# Patient Record
Sex: Female | Born: 1955
Health system: Southern US, Community
[De-identification: ages and names within clinical notes are randomized; demographics above are authoritative.]

## PROBLEM LIST (undated history)

## (undated) DIAGNOSIS — I10 Essential (primary) hypertension: Secondary | ICD-10-CM

## (undated) DIAGNOSIS — I498 Other specified cardiac arrhythmias: Secondary | ICD-10-CM

## (undated) DIAGNOSIS — G90A Postural orthostatic tachycardia syndrome (POTS): Secondary | ICD-10-CM

## (undated) DIAGNOSIS — D8940 Mast cell activation, unspecified: Secondary | ICD-10-CM

## (undated) DIAGNOSIS — M8580 Other specified disorders of bone density and structure, unspecified site: Secondary | ICD-10-CM

## (undated) DIAGNOSIS — K5792 Diverticulitis of intestine, part unspecified, without perforation or abscess without bleeding: Secondary | ICD-10-CM

## (undated) DIAGNOSIS — M797 Fibromyalgia: Secondary | ICD-10-CM

## (undated) DIAGNOSIS — G901 Familial dysautonomia [Riley-Day]: Secondary | ICD-10-CM

## (undated) DIAGNOSIS — R000 Tachycardia, unspecified: Secondary | ICD-10-CM

## (undated) DIAGNOSIS — F419 Anxiety disorder, unspecified: Secondary | ICD-10-CM

## (undated) DIAGNOSIS — A6920 Lyme disease, unspecified: Secondary | ICD-10-CM

## (undated) DIAGNOSIS — I951 Orthostatic hypotension: Secondary | ICD-10-CM

## (undated) HISTORY — DX: Other specified disorders of bone density and structure, unspecified site: M85.80

---

## 1998-02-28 ENCOUNTER — Other Ambulatory Visit: Admission: RE | Admit: 1998-02-28 | Discharge: 1998-02-28 | Payer: Self-pay | Admitting: Obstetrics & Gynecology

## 1998-07-09 ENCOUNTER — Ambulatory Visit (HOSPITAL_COMMUNITY): Admission: RE | Admit: 1998-07-09 | Discharge: 1998-07-09 | Payer: Self-pay | Admitting: Neurology

## 1999-04-12 ENCOUNTER — Other Ambulatory Visit: Admission: RE | Admit: 1999-04-12 | Discharge: 1999-04-12 | Payer: Self-pay | Admitting: Obstetrics & Gynecology

## 2000-05-29 ENCOUNTER — Other Ambulatory Visit: Admission: RE | Admit: 2000-05-29 | Discharge: 2000-05-29 | Payer: Self-pay | Admitting: Obstetrics & Gynecology

## 2001-07-02 ENCOUNTER — Other Ambulatory Visit: Admission: RE | Admit: 2001-07-02 | Discharge: 2001-07-02 | Payer: Self-pay | Admitting: Obstetrics & Gynecology

## 2002-10-13 ENCOUNTER — Other Ambulatory Visit: Admission: RE | Admit: 2002-10-13 | Discharge: 2002-10-13 | Payer: Self-pay | Admitting: Obstetrics & Gynecology

## 2003-12-27 ENCOUNTER — Other Ambulatory Visit: Admission: RE | Admit: 2003-12-27 | Discharge: 2003-12-27 | Payer: Self-pay | Admitting: Obstetrics & Gynecology

## 2004-01-17 ENCOUNTER — Encounter
Admission: RE | Admit: 2004-01-17 | Discharge: 2004-01-17 | Payer: Self-pay | Admitting: Physical Medicine and Rehabilitation

## 2004-02-19 HISTORY — PX: COLPOSCOPY: SHX161

## 2004-04-19 HISTORY — PX: CERVICAL BIOPSY  W/ LOOP ELECTRODE EXCISION: SUR135

## 2004-11-14 ENCOUNTER — Emergency Department (HOSPITAL_COMMUNITY): Admission: EM | Admit: 2004-11-14 | Discharge: 2004-11-14 | Payer: Self-pay | Admitting: Emergency Medicine

## 2005-06-16 ENCOUNTER — Ambulatory Visit: Payer: Self-pay | Admitting: Internal Medicine

## 2005-09-09 ENCOUNTER — Encounter: Admission: RE | Admit: 2005-09-09 | Discharge: 2005-09-09 | Payer: Self-pay | Admitting: Obstetrics & Gynecology

## 2005-09-15 ENCOUNTER — Encounter: Admission: RE | Admit: 2005-09-15 | Discharge: 2005-09-15 | Payer: Self-pay | Admitting: Obstetrics & Gynecology

## 2005-10-20 ENCOUNTER — Ambulatory Visit: Payer: Self-pay | Admitting: Internal Medicine

## 2005-11-11 ENCOUNTER — Ambulatory Visit (HOSPITAL_BASED_OUTPATIENT_CLINIC_OR_DEPARTMENT_OTHER): Admission: RE | Admit: 2005-11-11 | Discharge: 2005-11-11 | Payer: Self-pay | Admitting: Internal Medicine

## 2005-11-16 ENCOUNTER — Ambulatory Visit: Payer: Self-pay | Admitting: Internal Medicine

## 2005-12-17 ENCOUNTER — Ambulatory Visit: Payer: Self-pay | Admitting: Internal Medicine

## 2006-01-07 ENCOUNTER — Encounter: Admission: RE | Admit: 2006-01-07 | Discharge: 2006-01-07 | Payer: Self-pay | Admitting: *Deleted

## 2008-07-18 ENCOUNTER — Encounter: Admission: RE | Admit: 2008-07-18 | Discharge: 2008-07-18 | Payer: Self-pay | Admitting: Family Medicine

## 2009-01-16 ENCOUNTER — Encounter: Admission: RE | Admit: 2009-01-16 | Discharge: 2009-01-16 | Payer: Self-pay | Admitting: Family Medicine

## 2009-03-20 ENCOUNTER — Emergency Department (HOSPITAL_COMMUNITY): Admission: EM | Admit: 2009-03-20 | Discharge: 2009-03-20 | Payer: Self-pay | Admitting: Emergency Medicine

## 2010-05-26 ENCOUNTER — Encounter: Payer: Self-pay | Admitting: Chiropractic Medicine

## 2010-09-20 NOTE — Assessment & Plan Note (Signed)
[facility]                               PULMONARY OFFICE NOTE   KACI, DILLIE                       MRN:          [id number]  DATE:12/17/2005                            DOB:          [birth year]    PULMONARY SLEEP FOLLOWUP:   PROBLEM LIST:  1. Excessive daytime somnolence.  2. Allergic rhinitis.   HISTORY:  She returns now after her nocturnal polysomnogram done November 11, 2005.  She had some difficulty initiating and maintaining sleep, primarily  in the first part of the night.  Otherwise she had only occasional sleep  disorder breathing events with an apnea-hypopnea index of 2.9 per hour which  is within normal limits.  Snoring was moderate to loud at times but oxygen  saturation fell no lower than 90%.  We compared this study to her first one  done elsewhere in 2005, which also showed essentially no sleep disordered  breathing.  Her primary complaint has been of daytime sleepiness.  She has  always been a night person and we discussed this.  She is having trouble in  particular when she has to sit quietly at 9 in the morning classes.  She  also notices increasing nasal congestion and we discussed management of  seasonal rhinitis complaints which may contribute to her occasional snoring.   MEDICATIONS:  Effexor 75 mg.  Note, that she stopped the Toprol and does  find as predicted that she has better energy without it.  She is to discuss  the indications and alternatives to Toprol with Dr. Doristine Counter.   OBJECTIVE:  VITAL SIGNS:  Weight 150 pounds, BP 142/100, pulse regular 86,  room air saturation 100%.  GENERAL:  She is trim and alert.  There is periorbital puffiness, mild nasal  stuffiness, but no frank nasal obstruction.  Breathing and pulse are normal.   IMPRESSION:  1. Second sleep study again confirms that she does not have obstructive      sleep apnea on a routine basis.  2. Snoring, probably relates to her allergic rhinitis.  3.  A component of her daytime fatigue complaint may be due to upper airway      resistance syndrome with sleep somewhat fragmented even though she does      not stop breathing enough to meet a formal diagnosis of sleep apnea.      This would be managed conservatively.   PLAN:  1. Discussed available options for management of allergic rhinitis      including trial of Singulair 1 daily or a nasal steroid if needed.  2. Try sample Provigil, 1/2 or 1 x 200 mg daily p.r.n. with careful      medical discussion done.  3. Emphasis on good sleep hygiene and realistic expectations including      opportunity for brief naps and appropriate use of caffeine.  4. She will discuss the risk benefit issues of a beta-blocker with her      primary physician,      question whether this has been an important part of her daytime  weariness.  5. I have offered to see her again p.r.n.                                   Clinton D. Maple Hudson, MD, [facility], FACP   CDY/MedQ  DD:  12/17/2005  DT:  12/17/2005  Job #:  [id number]   cc:   Marjory Lies, MD

## 2010-09-20 NOTE — Procedures (Signed)
NAME:  Katie [name], Katie [name]                ACCOUNT NO.:  [id number]   MEDICAL RECORD NO.:  [id number]          PATIENT TYPE:  OUT   LOCATION:  SLEEP CENTER                 FACILITY:  [facility]   PHYSICIAN:  Clinton D. Maple Hudson, M.D. DATE OF BIRTH:  09-Jan-1956   DATE OF STUDY:  11/11/2005                              NOCTURNAL POLYSOMNOGRAM   REFERRING PHYSICIAN:  Dr. Jetty Duhamel.   INDICATIONS FOR STUDY:  Hypersomnia with sleep apnea.   EPWORTH SLEEPINESS SCORE:  9/24, BMI 23.6.  Weight 151 pounds.   HOME MEDICATION:  Effexor, Boniva.   SLEEP ARCHITECTURE:  Total sleep time 331 minutes with sleep efficiency 76%.  Stage I was 6%, stage II 62%, stages III and IV were absent, REM 32% of  total sleep time.  Sleep latency 77 minutes, REM latency 89 minutes, awake  after sleep onset 29 minutes, arousal index of 16.  No bedtime medication  was taken.  Sleep onset was delayed at 12:02 a.m., and sleep was fragmented  with frequent wakings until approximately 2:00 a.m.Marland Kitchen  She was described as  restless in the first part of the night with no other specific observation  offered by technician or patient.   RESPIRATORY DATA:  Apnea/hypopnea index (AHI, RDI) 2.9 obstructive events  per hour which is within normal limits (normal range 0-5 per hour).  There  were 8 obstructive apneas and 8 hypopneas.  Events were not positional.  REM  AHI 7.4.   OXYGEN DATA:  Moderate to severe snoring with oxygen desaturation to a nadir  of 90%.  Mean oxygen saturation through the study was 97% on room air.   CARDIAC DATA:  Normal sinus rhythm.   MOVEMENT/PARASOMNIA:  A total of 28 limb jerks were recorded of which 10  were associated with arousal or awakening for a periodic limb movement with  arousal index of 1.8 per hour which is increased.   IMPRESSION/RECOMMENDATIONS:  1.  Delayed sleep onset and fragmented initial hours of sleep.  This should      be correlated with the patient's home sleep experience.  2.   Occasional sleep disordered breathing events, apnea/hypopnea index 2.9      per hour, within normal limits.  Snoring was moderate to loud with      oxygen desaturation to a nadir of 90%.  Consider possibility that upper      airway resistance is contributing to sleep fragmentation without meeting      diagnostic criteria be considered apneas.      Clinton D. Maple Hudson, M.D.  Diplomate, Biomedical engineer of Sleep Medicine  Electronically Signed     CDY/MEDQ  D:  11/16/2005 09:07:39  T:  11/16/2005 10:46:23  Job:  [id number]

## 2010-10-05 ENCOUNTER — Emergency Department (HOSPITAL_COMMUNITY)
Admission: EM | Admit: 2010-10-05 | Discharge: 2010-10-05 | Disposition: A | Discharge status: Home / Self Care | Payer: Medicare Other | Attending: Emergency Medicine | Admitting: Emergency Medicine

## 2010-10-05 DIAGNOSIS — R5381 Other malaise: Secondary | ICD-10-CM | POA: Insufficient documentation

## 2010-10-05 DIAGNOSIS — F410 Panic disorder [episodic paroxysmal anxiety] without agoraphobia: Secondary | ICD-10-CM | POA: Insufficient documentation

## 2010-10-05 DIAGNOSIS — R5382 Chronic fatigue, unspecified: Secondary | ICD-10-CM | POA: Insufficient documentation

## 2010-10-05 DIAGNOSIS — R1032 Left lower quadrant pain: Secondary | ICD-10-CM | POA: Insufficient documentation

## 2010-10-05 DIAGNOSIS — R42 Dizziness and giddiness: Secondary | ICD-10-CM | POA: Insufficient documentation

## 2010-10-05 DIAGNOSIS — G9332 Myalgic encephalomyelitis/chronic fatigue syndrome: Secondary | ICD-10-CM | POA: Insufficient documentation

## 2010-10-05 DIAGNOSIS — R5383 Other fatigue: Secondary | ICD-10-CM | POA: Insufficient documentation

## 2010-10-05 DIAGNOSIS — I10 Essential (primary) hypertension: Secondary | ICD-10-CM | POA: Insufficient documentation

## 2010-10-05 LAB — POCT PREGNANCY, URINE

## 2010-10-05 LAB — GLUCOSE, CAPILLARY: Glucose-Capillary: 109 mg/dL — ABNORMAL HIGH (ref 70–99)

## 2011-07-01 ENCOUNTER — Emergency Department (HOSPITAL_COMMUNITY)
Admission: EM | Admit: 2011-07-01 | Discharge: 2011-07-01 | Disposition: A | Discharge status: Home / Self Care | Payer: Medicare Other | Attending: Emergency Medicine | Admitting: Emergency Medicine

## 2011-07-01 ENCOUNTER — Encounter (HOSPITAL_COMMUNITY): Payer: Self-pay | Admitting: *Deleted

## 2011-07-01 DIAGNOSIS — E876 Hypokalemia: Secondary | ICD-10-CM | POA: Insufficient documentation

## 2011-07-01 DIAGNOSIS — R197 Diarrhea, unspecified: Secondary | ICD-10-CM | POA: Insufficient documentation

## 2011-07-01 DIAGNOSIS — E860 Dehydration: Secondary | ICD-10-CM

## 2011-07-01 DIAGNOSIS — E871 Hypo-osmolality and hyponatremia: Secondary | ICD-10-CM | POA: Insufficient documentation

## 2011-07-01 HISTORY — DX: Essential (primary) hypertension: I10

## 2011-07-01 HISTORY — DX: Lyme disease, unspecified: A69.20

## 2011-07-01 LAB — BASIC METABOLIC PANEL
CO2: 25 mEq/L (ref 19–32)
Calcium: 9.7 mg/dL (ref 8.4–10.5)
GFR calc Af Amer: >90 mL/min (ref >90)
GFR calc non Af Amer: >90 mL/min (ref >90)
Sodium: 130 mEq/L — ABNORMAL LOW (ref 135–145)

## 2011-07-01 LAB — URINALYSIS, ROUTINE W REFLEX MICROSCOPIC
Glucose, UA: NEGATIVE mg/dL
Leukocytes, UA: NEGATIVE
Nitrite: NEGATIVE
Protein, ur: NEGATIVE mg/dL
Urobilinogen, UA: 0.2 mg/dL (ref 0.0–1.0)

## 2011-07-01 LAB — URINE MICROSCOPIC-ADD ON

## 2011-07-01 MED ORDER — POTASSIUM CHLORIDE CRYS ER 20 MEQ PO TBCR: 20.0000 meq | EXTENDED_RELEASE_TABLET | Freq: Two times a day (BID) | ORAL | Status: DC

## 2011-07-01 MED ORDER — POTASSIUM CHLORIDE CRYS ER 20 MEQ PO TBCR
40.0000 meq | EXTENDED_RELEASE_TABLET | Freq: Once | ORAL | Status: AC
  Administered 2011-07-01: 40 meq via ORAL
  Filled 2011-07-01: qty 2

## 2011-07-01 MED ORDER — SODIUM CHLORIDE 0.9 % IV BOLUS (SEPSIS)
1000.0000 mL | Freq: Once | INTRAVENOUS | Status: AC
  Administered 2011-07-01: 1000 mL via INTRAVENOUS

## 2011-07-01 NOTE — ED Provider Notes (Signed)
History     CSN: [id number]  Arrival date & time 07/01/11  0019   First MD Initiated Contact with Patient 07/01/11 0142      Chief Complaint  Patient presents with  . Dizziness  . Hypertension    (Consider location/radiation/quality/duration/timing/severity/associated sxs/prior treatment) HPI Comments: 56 year old female with a history of chronic fatigue syndrome, fibromyalgia, Lyme disease, hypertension. She presents with a complaint of generalized weakness over the last 24 hours. According to the patient she started having watery diarrhea yesterday and has had persistent watery diarrhea throughout the day. She states that she has felt lightheaded with standing, dizzy, dehydrated and has had a poor appetite. She denies chest pain shortness of breath back pain swelling rashes fevers headache or any other complaints.  Patient is a 56 y.o. female presenting with hypertension. The history is provided by the patient and the spouse.  Hypertension    Past Medical History  Diagnosis Date  . Hypertension   . Lyme disease     History reviewed. No pertinent past surgical history.  History reviewed. No pertinent family history.  History  Substance Use Topics  . Smoking status: Not on file  . Smokeless tobacco: Not on file  . Alcohol Use:     OB History    Grav Para Term Preterm Abortions TAB SAB Ect Mult Living                  Review of Systems  All other systems reviewed and are negative.    Allergies  Penicillins and Sulfa antibiotics  Home Medications   Current Outpatient Rx  Name Route Sig Dispense Refill  . POTASSIUM CHLORIDE CRYS ER 20 MEQ PO TBCR Oral Take 1 tablet (20 mEq total) by mouth 2 (two) times daily. 20 tablet 0    BP 122/76  Pulse 98  Temp(Src) 98.4 F (36.9 C) (Oral)  Resp 18  SpO2 99%  Physical Exam  Nursing note and vitals reviewed. Constitutional: She appears well-developed and well-nourished. No distress.  HENT:  Head: Normocephalic  and atraumatic.  Mouth/Throat: Oropharynx is clear and moist. No oropharyngeal exudate.  Eyes: Conjunctivae and EOM are normal. Pupils are equal, round, and reactive to light. Right eye exhibits no discharge. Left eye exhibits no discharge. No scleral icterus.  Neck: Normal range of motion. Neck supple. No JVD present. No thyromegaly present.  Cardiovascular: Normal rate, regular rhythm, normal heart sounds and intact distal pulses.  Exam reveals no gallop and no friction rub.   No murmur heard. Pulmonary/Chest: Effort normal and breath sounds normal. No respiratory distress. She has no wheezes. She has no rales.  Abdominal: Soft. Bowel sounds are normal. She exhibits no distension and no mass. There is no tenderness.  Musculoskeletal: Normal range of motion. She exhibits no edema and no tenderness.  Lymphadenopathy:    She has no cervical adenopathy.  Neurological: She is alert. Coordination normal.  Skin: Skin is warm and dry. No rash noted. No erythema.  Psychiatric: She has a normal mood and affect. Her behavior is normal.    ED Course  Procedures (including critical care time)  Labs Reviewed  BASIC METABOLIC PANEL - Abnormal; Notable for the following:    Sodium 130 (*)    Potassium 3.0 (*)    Chloride 92 (*)    All other components within normal limits  URINALYSIS, ROUTINE W REFLEX MICROSCOPIC - Abnormal; Notable for the following:    Hgb urine dipstick MODERATE (*)    Ketones, ur TRACE (*)  All other components within normal limits  URINE MICROSCOPIC-ADD ON   No results found.   1. Hypokalemia   2. Diarrhea   3. Dehydration   4. Hyponatremia       MDM  No abdominal tenderness, vital signs remarkable only for blood pressure of 170/95 but this has since improved spontaneously. We'll proceed with IV fluids, check a lateralizing urinalysis, suspect related to dehydration and possible diarrhea type illness. Intravenous fluid   Lab results interpretation shows  hyponatremia and hypokalemia. This is mild and can be followed as outpatient.  Patient improved significantly after IV fluids, potassium supplementation given prior to discharge  Vida Roller, MD 07/01/11 [phone]

## 2011-07-01 NOTE — ED Notes (Signed)
Pt feeling better.  Given a sandwich and drink.

## 2011-07-01 NOTE — ED Notes (Signed)
Pt states she hasn't felt well all day.  C/o "pressure" in RLQ.  Diarrhea x1day.  Took pressure at home and it was 190 over something.  States she feels light headed.

## 2011-07-01 NOTE — Discharge Instructions (Signed)
Return to the emergency department for severe or worsening symptoms. Clear Dr. in the morning to arrange a followup exam in the next one to 2 days.  RESOURCE GUIDE  Dental Problems  Patients with Medicaid: [facility] [phone] [address]                                           [phone] [address] Phone:  [phone]                                                  Phone:  [phone]  If unable to pay or uninsured, contact:  [facility] or [facility]. to become qualified for the adult dental clinic.  Chronic Pain Problems Contact [facility]  [phone] Patients need to be referred by their primary care doctor.  Insufficient Money for Medicine Contact [employer] Way:  call "211" or [facility] [phone].  No Primary Care Doctor Call [facility]  [phone] Other agencies that provide inexpensive medical care    [facility]  [phone]    [facility]  [phone]    [facility]  [phone]    [facility]  [phone]    [facility]  [phone]    [facility] Clinic  [phone]  Psychological Services [facility] Behavioral Health  [phone] [facility] Services  [phone] [facility] Mental Health   [phone] (emergency services ([phone]) [phone])  Substance Abuse Resources [facility]  [phone] [facility] [phone] [facility] [phone] Floydene Flock [phone] Residential & Outpatient Substance Abuse Program  [phone]  Abuse/Neglect [facility] Child Abuse Hotline 7170716553 [facility] Child Abuse Hotline [phone] (After Hours)  Emergency Shelter [facility] [phone]  Maternity Homes [facility] [place] [facility] [place] [phone] [facility] Services [phone]  MRSA Hotline #:   437-728-2300    [facility] Resources  [facility]  [place]     [employer]                          [facility] Dept. [address] [place]                       [address]      [address]  [place]                                                Cristobal Goldmann Phone:  [phone]                                   Phone:  [phone]                 Phone:  [phone]  [facility] Mental Health Phone:  [phone]  [facility] Child Abuse Hotline 5047892715 218-523-1964 (After Hours)

## 2011-07-01 NOTE — ED Notes (Addendum)
Pt in c/o dizziness at home, states she checked her BP at home and it was elevated, states she was taken off BP medication 1 year ago by MD, also c/o dull pain to RUQ x1 day with diarrhea

## 2011-09-10 DIAGNOSIS — E871 Hypo-osmolality and hyponatremia: Secondary | ICD-10-CM | POA: Insufficient documentation

## 2011-09-10 DIAGNOSIS — R197 Diarrhea, unspecified: Secondary | ICD-10-CM | POA: Insufficient documentation

## 2011-09-10 DIAGNOSIS — IMO0001 Reserved for inherently not codable concepts without codable children: Secondary | ICD-10-CM | POA: Insufficient documentation

## 2011-09-10 DIAGNOSIS — E876 Hypokalemia: Secondary | ICD-10-CM | POA: Insufficient documentation

## 2011-09-10 DIAGNOSIS — F411 Generalized anxiety disorder: Secondary | ICD-10-CM | POA: Insufficient documentation

## 2011-09-10 DIAGNOSIS — I10 Essential (primary) hypertension: Secondary | ICD-10-CM | POA: Insufficient documentation

## 2011-09-11 ENCOUNTER — Encounter (HOSPITAL_COMMUNITY): Payer: Self-pay | Admitting: Family Medicine

## 2011-09-11 ENCOUNTER — Emergency Department (HOSPITAL_COMMUNITY)
Admission: EM | Admit: 2011-09-11 | Discharge: 2011-09-11 | Disposition: A | Discharge status: Home / Self Care | Payer: Medicare Other | Attending: Emergency Medicine | Admitting: Emergency Medicine

## 2011-09-11 DIAGNOSIS — E876 Hypokalemia: Secondary | ICD-10-CM

## 2011-09-11 DIAGNOSIS — R197 Diarrhea, unspecified: Secondary | ICD-10-CM

## 2011-09-11 DIAGNOSIS — F419 Anxiety disorder, unspecified: Secondary | ICD-10-CM

## 2011-09-11 DIAGNOSIS — E871 Hypo-osmolality and hyponatremia: Secondary | ICD-10-CM

## 2011-09-11 HISTORY — DX: Anxiety disorder, unspecified: F41.9

## 2011-09-11 HISTORY — DX: Fibromyalgia: M79.7

## 2011-09-11 LAB — DIFFERENTIAL
Basophils Relative: 1 % (ref 0–1)
Eosinophils Absolute: 0.2 10*3/uL (ref 0.0–0.7)
Eosinophils Relative: 3 % (ref 0–5)
Neutrophils Relative %: 49 % (ref 43–77)

## 2011-09-11 LAB — CBC
MCH: 33.9 pg (ref 26.0–34.0)
MCHC: 34.9 g/dL (ref 30.0–36.0)
MCV: 97.1 fL (ref 78.0–100.0)
Platelets: 282 10*3/uL (ref 150–400)
RDW: 12.2 % (ref 11.5–15.5)

## 2011-09-11 MED ORDER — LORAZEPAM 2 MG/ML IJ SOLN
1.0000 mg | Freq: Once | INTRAMUSCULAR | Status: AC
  Administered 2011-09-11: 1 mg via INTRAVENOUS
  Filled 2011-09-11: qty 1

## 2011-09-11 MED ORDER — POTASSIUM CHLORIDE CRYS ER 20 MEQ PO TBCR
20.0000 meq | EXTENDED_RELEASE_TABLET | Freq: Once | ORAL | Status: AC
  Administered 2011-09-11: 20 meq via ORAL
  Filled 2011-09-11: qty 1

## 2011-09-11 MED ORDER — LORAZEPAM 1 MG PO TABS: 0.5000 mg | ORAL_TABLET | Freq: Three times a day (TID) | ORAL | Status: AC | PRN

## 2011-09-11 MED ORDER — SODIUM CHLORIDE 0.9 % IV BOLUS (SEPSIS)
1000.0000 mL | Freq: Once | INTRAVENOUS | Status: AC
  Administered 2011-09-11: 1000 mL via INTRAVENOUS

## 2011-09-11 NOTE — Discharge Instructions (Signed)
Anxiety and Panic Attacks Anxiety is your body's way of reacting to real danger or something you think is a danger. It may be fear or worry over a situation like losing your job. Sometimes the cause is not known. A panic attack is made up of physical signs like sweating, shaking, or chest pain. Anxiety and panic attacks may start suddenly. They may be strong. They may come at any time of day, even while sleeping. They may come at any time of life. Panic attacks are scary, but they do not harm you physically.  HOME CARE  Avoid any known causes of your anxiety.   Try to relax. Yoga may help. Tell yourself everything will be okay.   Exercise often.   Get expert advice and help (therapy) to stop anxiety or attacks from happening.   Avoid caffeine, alcohol, and drugs.   Only take medicine as told by your doctor.  GET HELP RIGHT AWAY IF:  Your attacks seem different than normal attacks.   Your problems are getting worse or concern you.  MAKE SURE YOU:  Understand these instructions.   Will watch your condition.   Will get help right away if you are not doing well or get worse.  Document Released: 05/24/2010 Document Revised: 04/10/2011 Document Reviewed: 05/24/2010 [facility] Patient Information 2012 [place], [place] You have been given a short term prescription for Ativan.  Please followup with your primary care physician for further intervention.  If this becomes a chronic problem.

## 2011-09-11 NOTE — ED Provider Notes (Signed)
History     CSN: [id number]  Arrival date & time 09/10/11  2354   First MD Initiated Contact with Patient 09/11/11 0100      Chief Complaint  Patient presents with  . Panic Attack  . Diarrhea    (Consider location/radiation/quality/duration/timing/severity/associated sxs/prior treatment) HPI Comments: Patient states she's been under a lot of stress.  This week.  Today, he developed copious amounts of diarrhea, followed by "anxiety attack."  She states this happens to her occasionally.  She has had low potassium in the past due to diarrhea.   Patient is a 56 y.o. female presenting with diarrhea. The history is provided by the patient.  Diarrhea The primary symptoms include diarrhea. Primary symptoms do not include fever, nausea or dysuria. The illness began today. The onset was sudden. The problem has been gradually improving.    Past Medical History  Diagnosis Date  . Hypertension   . Lyme disease   . Fibromyalgia   . Anxiety     History reviewed. No pertinent past surgical history.  No family history on file.  History  Substance Use Topics  . Smoking status: Never Smoker   . Smokeless tobacco: Not on file  . Alcohol Use: No     Ocaasional glass of wine    OB History    Grav Para Term Preterm Abortions TAB SAB Ect Mult Living                  Review of Systems  Constitutional: Negative for fever.  Gastrointestinal: Positive for diarrhea. Negative for nausea and abdominal distention.  Genitourinary: Negative for dysuria.  Skin: Negative for pallor.  Neurological: Negative for dizziness and weakness.  Psychiatric/Behavioral: The patient is nervous/anxious.     Allergies  Penicillins and Sulfa antibiotics  Home Medications   Current Outpatient Rx  Name Route Sig Dispense Refill  . POTASSIUM CHLORIDE CRYS ER 20 MEQ PO TBCR Oral Take 1 tablet (20 mEq total) by mouth 2 (two) times daily. 20 tablet 0    BP 140/86  Pulse 87  Temp(Src) 98.1 F (36.7 C)  (Oral)  Resp 18  SpO2 100%  LMP 09/02/2011  Physical Exam  Constitutional: She is oriented to person, place, and time. She appears well-developed.  HENT:  Head: Normocephalic.  Eyes: Pupils are equal, round, and reactive to light.  Cardiovascular: Normal rate.   Abdominal: She exhibits no distension. There is no tenderness.  Neurological: She is alert and oriented to person, place, and time.  Skin: Skin is warm.    ED Course  Procedures (including critical care time)   Labs Reviewed  CBC  DIFFERENTIAL   No results found.   No diagnosis found.  I-STAT, reviewed.  Sodium is 128, potassium is 3.4, chloride 94, total CO2 24, glucose, 137, BUN less than 3, creatinine 0.6, hemoglobin 13.9, hematocrit 41 Troponin 0.00 ED ECG REPORT   Date: 09/11/2011  EKG Time: 2:05 AM  Rate: 94  Rhythm: normal sinus rhythm,  normal EKG, normal sinus rhythm, unchanged from previous tracings  Axis:normal  Intervals:none  ST&T Change: normal  Narrative Interpretation: normal   After hydration 20 MEQ of potassium chloride and 1 mg of Ativan.  Patient is feeling, better, requesting to go home        MDM  Patient has been extremely stressed over the last 2 weeks.  She is graduating from college in several days and has been pushing herself quite hard.  Grades etc., today, she developed diarrhea, and  extreme anxiety        Arman Filter, NP 09/11/11 [phone]

## 2011-09-11 NOTE — ED Notes (Signed)
Pt noted that she has had a lot of stressors in the last week including graduating from school as well as arguments with boyfriend and had a anxiety attack this evening.

## 2011-09-11 NOTE — ED Notes (Signed)
Patient states that she "had a real bad anxiety attack around 730pm" States she has had diarrhea all day. Took her blood pressure and it was 190/100.

## 2011-09-12 LAB — POCT I-STAT TROPONIN I: Troponin i, poc: 0 ng/mL (ref 0.00–0.08)

## 2011-09-12 NOTE — ED Provider Notes (Signed)
Medical screening examination/treatment/procedure(s) were performed by non-physician practitioner and as supervising physician I was immediately available for consultation/collaboration.  Saumya Hukill, MD 09/12/11 0231 

## 2011-09-15 LAB — POCT I-STAT, CHEM 8
Calcium, Ion: 1.13 mmol/L (ref 1.12–1.32)
Chloride: 94 mEq/L — ABNORMAL LOW (ref 96–112)
Glucose, Bld: 137 mg/dL — ABNORMAL HIGH (ref 70–99)
HCT: 41 % (ref 36.0–46.0)
TCO2: 24 mmol/L (ref 0–100)

## 2012-04-27 ENCOUNTER — Encounter (HOSPITAL_COMMUNITY): Payer: Self-pay | Admitting: *Deleted

## 2012-04-27 ENCOUNTER — Emergency Department (HOSPITAL_COMMUNITY)
Admission: EM | Admit: 2012-04-27 | Discharge: 2012-04-27 | Disposition: A | Discharge status: Home / Self Care | Payer: Medicare Other | Attending: Emergency Medicine | Admitting: Emergency Medicine

## 2012-04-27 DIAGNOSIS — Y939 Activity, unspecified: Secondary | ICD-10-CM | POA: Insufficient documentation

## 2012-04-27 DIAGNOSIS — Z8619 Personal history of other infectious and parasitic diseases: Secondary | ICD-10-CM | POA: Insufficient documentation

## 2012-04-27 DIAGNOSIS — I10 Essential (primary) hypertension: Secondary | ICD-10-CM | POA: Insufficient documentation

## 2012-04-27 DIAGNOSIS — S90129A Contusion of unspecified lesser toe(s) without damage to nail, initial encounter: Secondary | ICD-10-CM | POA: Insufficient documentation

## 2012-04-27 DIAGNOSIS — F411 Generalized anxiety disorder: Secondary | ICD-10-CM | POA: Insufficient documentation

## 2012-04-27 DIAGNOSIS — Y9289 Other specified places as the place of occurrence of the external cause: Secondary | ICD-10-CM | POA: Insufficient documentation

## 2012-04-27 DIAGNOSIS — Z79899 Other long term (current) drug therapy: Secondary | ICD-10-CM | POA: Insufficient documentation

## 2012-04-27 DIAGNOSIS — IMO0001 Reserved for inherently not codable concepts without codable children: Secondary | ICD-10-CM | POA: Insufficient documentation

## 2012-04-27 DIAGNOSIS — W010XXA Fall on same level from slipping, tripping and stumbling without subsequent striking against object, initial encounter: Secondary | ICD-10-CM | POA: Insufficient documentation

## 2012-04-27 MED ORDER — AZITHROMYCIN 250 MG PO TABS: 250.0000 mg | ORAL_TABLET | Freq: Every day | ORAL | Status: DC

## 2012-04-27 NOTE — ED Provider Notes (Signed)
History     CSN: [id number]  Arrival date & time 04/27/12  2039   First MD Initiated Contact with Patient 04/27/12 2118      Chief Complaint  Patient presents with  . Toe Injury    (Consider location/radiation/quality/duration/timing/severity/associated sxs/prior treatment) Patient is a 56 y.o. female presenting with toe pain. The history is provided by the patient.  Toe Pain This is a new problem. The current episode started yesterday. Pertinent negatives include no fever. Associated symptoms comments: She slipped on a wet surface last night injuring her left 2nd toe. This morning the toe was more red and swollen and she was concerned about infection. No increased pain. No other injury..    Past Medical History  Diagnosis Date  . Hypertension   . Lyme disease   . Fibromyalgia   . Anxiety   . Lyme disease     History reviewed. No pertinent past surgical history.  No family history on file.  History  Substance Use Topics  . Smoking status: Never Smoker   . Smokeless tobacco: Not on file  . Alcohol Use: No     Comment: Ocaasional glass of wine    OB History    Grav Para Term Preterm Abortions TAB SAB Ect Mult Living                  Review of Systems  Constitutional: Negative for fever.  Musculoskeletal:       Left 2nd toe pain.  Skin: Positive for color change.    Allergies  Penicillins and Sulfa antibiotics  Home Medications   Current Outpatient Rx  Name  Route  Sig  Dispense  Refill  . VITAMIN B COMPLEX IJ   Injection   Inject 1 mL as directed daily.         Marland Kitchen ESCITALOPRAM OXALATE 20 MG PO TABS   Oral   Take 20 mg by mouth daily.         Marland Kitchen VITAMIN D (ERGOCALCIFEROL) 50000 UNITS PO CAPS   Oral   Take 50,000 Units by mouth daily.           BP 142/81  Pulse 98  Temp 97.9 F (36.6 C) (Oral)  Resp 20  SpO2 98%  LMP 04/02/2012  Physical Exam  Constitutional: She appears well-developed and well-nourished. No distress.   Musculoskeletal:       Left 2nd toe red below cuticle. No drainage or bleeding. Swelling is limited to reddened area, no streaking. Mildly tender.   Skin: There is erythema.    ED Course  Procedures (including critical care time)  Labs Reviewed - No data to display No results found.   No diagnosis found.  1. Contusion toe  MDM  Feel the redness of concern to the patient is related to trauma and not to infection given the short duration of time since injury. Will give Rx for abx to fill if the redness becomes larger, pain is greater or if there is purulent drainage.        Rodena Medin, PA-C 04/27/12 2312

## 2012-04-27 NOTE — ED Notes (Signed)
Pt hasn't had a tetanus in last 10 years but prefers not to get one because she is recovering from Lyme's disease

## 2012-04-27 NOTE — ED Notes (Signed)
Pt slipped on her deck yesterday and injured her left second toe,  Pt has pain 3/10 in toe,  Able to manipulate toe but not to full extent,  Also hurts worse to walk

## 2012-04-28 NOTE — ED Provider Notes (Signed)
Medical screening examination/treatment/procedure(s) were performed by non-physician practitioner and as supervising physician I was immediately available for consultation/collaboration.   Cesar Alf, MD 04/28/12 0021 

## 2012-06-24 LAB — HM PAP SMEAR: HM Pap smear: NORMAL

## 2012-07-20 ENCOUNTER — Other Ambulatory Visit: Payer: Self-pay | Admitting: *Deleted

## 2012-07-20 DIAGNOSIS — M8580 Other specified disorders of bone density and structure, unspecified site: Secondary | ICD-10-CM

## 2012-07-20 DIAGNOSIS — E559 Vitamin D deficiency, unspecified: Secondary | ICD-10-CM

## 2012-07-29 ENCOUNTER — Ambulatory Visit
Admission: RE | Admit: 2012-07-29 | Discharge: 2012-07-29 | Disposition: A | Discharge status: Home / Self Care | Payer: Medicare Other | Source: Ambulatory Visit | Attending: *Deleted | Admitting: *Deleted

## 2012-07-29 DIAGNOSIS — E559 Vitamin D deficiency, unspecified: Secondary | ICD-10-CM

## 2012-07-29 DIAGNOSIS — M8580 Other specified disorders of bone density and structure, unspecified site: Secondary | ICD-10-CM

## 2012-09-11 ENCOUNTER — Emergency Department (HOSPITAL_COMMUNITY)
Admission: EM | Admit: 2012-09-11 | Discharge: 2012-09-11 | Disposition: A | Discharge status: Home / Self Care | Payer: Medicare Other | Attending: Emergency Medicine | Admitting: Emergency Medicine

## 2012-09-11 ENCOUNTER — Emergency Department (HOSPITAL_COMMUNITY): Payer: Medicare Other

## 2012-09-11 ENCOUNTER — Encounter (HOSPITAL_COMMUNITY): Payer: Self-pay

## 2012-09-11 DIAGNOSIS — Z79899 Other long term (current) drug therapy: Secondary | ICD-10-CM | POA: Insufficient documentation

## 2012-09-11 DIAGNOSIS — Z880 Allergy status to penicillin: Secondary | ICD-10-CM | POA: Insufficient documentation

## 2012-09-11 DIAGNOSIS — R7309 Other abnormal glucose: Secondary | ICD-10-CM | POA: Insufficient documentation

## 2012-09-11 DIAGNOSIS — R5381 Other malaise: Secondary | ICD-10-CM | POA: Insufficient documentation

## 2012-09-11 DIAGNOSIS — IMO0001 Reserved for inherently not codable concepts without codable children: Secondary | ICD-10-CM | POA: Insufficient documentation

## 2012-09-11 DIAGNOSIS — I10 Essential (primary) hypertension: Secondary | ICD-10-CM | POA: Insufficient documentation

## 2012-09-11 DIAGNOSIS — Z8619 Personal history of other infectious and parasitic diseases: Secondary | ICD-10-CM | POA: Insufficient documentation

## 2012-09-11 DIAGNOSIS — J309 Allergic rhinitis, unspecified: Secondary | ICD-10-CM | POA: Insufficient documentation

## 2012-09-11 DIAGNOSIS — E876 Hypokalemia: Secondary | ICD-10-CM | POA: Insufficient documentation

## 2012-09-11 DIAGNOSIS — R739 Hyperglycemia, unspecified: Secondary | ICD-10-CM

## 2012-09-11 DIAGNOSIS — F411 Generalized anxiety disorder: Secondary | ICD-10-CM | POA: Insufficient documentation

## 2012-09-11 DIAGNOSIS — E871 Hypo-osmolality and hyponatremia: Secondary | ICD-10-CM

## 2012-09-11 LAB — CBC
HCT: 34.7 % — ABNORMAL LOW (ref 36.0–46.0)
Hemoglobin: 12.3 g/dL (ref 12.0–15.0)
MCV: 98.3 fL (ref 78.0–100.0)
RBC: 3.53 MIL/uL — ABNORMAL LOW (ref 3.87–5.11)
WBC: 7.8 10*3/uL (ref 4.0–10.5)

## 2012-09-11 LAB — BASIC METABOLIC PANEL
BUN: 6 mg/dL (ref 6–23)
CO2: 25 mEq/L (ref 19–32)
Chloride: 93 mEq/L — ABNORMAL LOW (ref 96–112)
Creatinine, Ser: 0.59 mg/dL (ref 0.50–1.10)
GFR calc Af Amer: >90 mL/min (ref >90)
Potassium: 3.9 mEq/L (ref 3.5–5.1)

## 2012-09-11 MED ORDER — ALPRAZOLAM 0.5 MG PO TABS: 0.5000 mg | ORAL_TABLET | Freq: Three times a day (TID) | ORAL | Status: DC | PRN

## 2012-09-11 NOTE — ED Notes (Signed)
Per ems- pt has been dieting, today developed sob and tachycardia. Hx of chronic lyme disease and electrolyte imbalance. Denies cp, n/v, diaphoresis. NAD noted. Pt ambulatory. Hr-104 BP-150/90 O2-98% on RA 20g Iv in LAC placed PTA

## 2012-09-11 NOTE — ED Notes (Signed)
Pt states that she woke up very fatigued today. Pt states her BP was elevated and felt weak. Pt states that in the past when this happened her electrolytes were low. Pt states she has not been eating well as she has been dieting. Pt states 'Im shaky and it's been hard to catch my breath." NAD noted. Pt alert and oriented x4.

## 2012-09-11 NOTE — ED Provider Notes (Signed)
History     CSN: [id number]  Arrival date & time 09/11/12  1409   First MD Initiated Contact with Patient 09/11/12 1414      Chief Complaint  Patient presents with  . Shortness of Breath    (Consider location/radiation/quality/duration/timing/severity/associated sxs/prior treatment) Patient is a 56 y.o. female presenting with shortness of breath. The history is provided by the patient.  Shortness of Breath Associated symptoms: no abdominal pain, no chest pain, no cough, no fever, no headaches, no neck pain, no rash and no vomiting   pt states felt generally weak today, and then states her bp was high, and then noted sob. Pt indicates has been pushing herself very hard lately, has been on a low/no carb diet for the past 2 weeks, and states she feels run down, and that possibly her electrolytes are off.  Pt denies any recent changei n meds or new meds. Denies any focal or unilateral numbness/weakness. No headache. No change in vision or speech. No problems w balance or coordination. Denies syncope/fainting. No palpitations. No chest pain or discomfort. Denies cough or uri c/o. No fever or chills. No abd pain. No nvd. No gu c/o.  No hx heart or lung dis. No recent immobility, trauma, surgery, leg pain/swelling, or hx dvt/pe.    Past Medical History  Diagnosis Date  . Hypertension   . Lyme disease   . Fibromyalgia   . Anxiety   . Lyme disease     History reviewed. No pertinent past surgical history.  History reviewed. No pertinent family history.  History  Substance Use Topics  . Smoking status: Never Smoker   . Smokeless tobacco: Not on file  . Alcohol Use: No     Comment: Ocaasional glass of wine    OB History   Grav Para Term Preterm Abortions TAB SAB Ect Mult Living                  Review of Systems  Constitutional: Negative for fever and chills.  HENT: Negative for neck pain.   Eyes: Negative for redness.  Respiratory: Positive for shortness of breath. Negative  for cough.   Cardiovascular: Negative for chest pain.  Gastrointestinal: Negative for vomiting and abdominal pain.  Genitourinary: Negative for dysuria and flank pain.  Musculoskeletal: Negative for back pain.  Skin: Negative for rash.  Allergic/Immunologic: Positive for environmental allergies.  Neurological: Negative for numbness and headaches.  Hematological: Does not bruise/bleed easily.  Psychiatric/Behavioral: Negative for confusion.    Allergies  Penicillins and Sulfa antibiotics  Home Medications   Current Outpatient Rx  Name  Route  Sig  Dispense  Refill  . B Complex Vitamins (VITAMIN B COMPLEX IJ)   Injection   Inject 1 mL as directed daily.         Marland Kitchen escitalopram (LEXAPRO) 20 MG tablet   Oral   Take 20 mg by mouth daily.         . Vitamin D, Ergocalciferol, (DRISDOL) 50000 UNITS CAPS   Oral   Take 50,000 Units by mouth daily.           BP 142/86  Pulse 89  Temp(Src) 98.1 F (36.7 C)  Resp 19  SpO2 99%  Physical Exam  Nursing note and vitals reviewed. Constitutional: She is oriented to person, place, and time. She appears well-developed and well-nourished. No distress.  HENT:  Head: Atraumatic.  Mouth/Throat: Oropharynx is clear and moist.  Eyes: Conjunctivae are normal. No scleral icterus.  Neck: Neck  supple. No tracheal deviation present. No thyromegaly present.  Cardiovascular: Normal rate, regular rhythm, normal heart sounds and intact distal pulses.   Pulmonary/Chest: Effort normal and breath sounds normal. No respiratory distress.  Abdominal: Soft. Normal appearance and bowel sounds are normal. She exhibits no distension. There is no tenderness.  Genitourinary:  No cva tenderness  Musculoskeletal: She exhibits no edema and no tenderness.  Neurological: She is alert and oriented to person, place, and time.  Motor intact bil. Steady gait.   Skin: Skin is warm and dry. No rash noted.  Psychiatric: She has a normal mood and affect.    ED  Course  Procedures (including critical care time)   Results for orders placed during the hospital encounter of 09/11/12  CBC      Result Value Range   WBC 7.8  4.0 - 10.5 K/uL   RBC 3.53 (*) 3.87 - 5.11 MIL/uL   Hemoglobin 12.3  12.0 - 15.0 g/dL   HCT 16.1 (*) 09.6 - 04.5 %   MCV 98.3  78.0 - 100.0 fL   MCH 34.8 (*) 26.0 - 34.0 pg   MCHC 35.4  30.0 - 36.0 g/dL   RDW 40.9  81.1 - 91.4 %   Platelets 271  150 - 400 K/uL  BASIC METABOLIC PANEL      Result Value Range   Sodium 129 (*) 135 - 145 mEq/L   Potassium 3.9  3.5 - 5.1 mEq/L   Chloride 93 (*) 96 - 112 mEq/L   CO2 25  19 - 32 mEq/L   Glucose, Bld 177 (*) 70 - 99 mg/dL   BUN 6  6 - 23 mg/dL   Creatinine, Ser 7.82  0.50 - 1.10 mg/dL   Calcium 8.8  8.4 - 95.6 mg/dL   GFR calc non Af Amer >90  >90 mL/min   GFR calc Af Amer >90  >90 mL/min   Dg Chest 2 View  09/11/2012  *RADIOLOGY REPORT*  Clinical Data: Bilateral neck and shoulder pain.  Lung disease. Fibromyalgia.  CHEST - 2 VIEW  Comparison: 01/16/2009.  Findings:  Cardiopericardial silhouette within normal limits. Mediastinal contours normal. Trachea midline.  No airspace disease or effusion.  Partially radiopaque monitoring buttons are projected over the chest.  IMPRESSION: No active cardiopulmonary disease.   Original Report Authenticated By: Andreas Newport, M.D.       MDM  Labs. Cxr.  Reviewed nursing notes and prior charts for additional history.    Date: 09/11/2012  Rate: 88  Rhythm: normal sinus rhythm  QRS Axis: normal  Intervals: normal  ST/T Wave abnormalities: normal  Conduction Disutrbances:none  Narrative Interpretation:   Old EKG Reviewed: unchanged  Na mildly low (129), on prior labs here from last year na 128.  Glucose 177, prior labs here w glucose 137.   No nvd. Pt eating and drinking normally.  Discussed above results w pt and need for close primary care/internal medicine follow up.  Hr 74 rr 16, no increased wob, pt states feels at baseline.   Pt appears stable for d/c.  Pt indicates hx anxiety, prior xanax rx for same, requests rx for xanax.          Suzi Roots, MD 09/11/12 1600

## 2012-09-30 ENCOUNTER — Ambulatory Visit
Admission: RE | Admit: 2012-09-30 | Discharge: 2012-09-30 | Disposition: A | Discharge status: Home / Self Care | Payer: Medicare Other | Source: Ambulatory Visit | Attending: *Deleted | Admitting: *Deleted

## 2012-09-30 ENCOUNTER — Other Ambulatory Visit: Payer: Self-pay | Admitting: *Deleted

## 2012-09-30 DIAGNOSIS — R52 Pain, unspecified: Secondary | ICD-10-CM

## 2012-10-25 ENCOUNTER — Encounter: Payer: Self-pay | Admitting: Family Medicine

## 2012-10-25 ENCOUNTER — Ambulatory Visit (INDEPENDENT_AMBULATORY_CARE_PROVIDER_SITE_OTHER): Payer: Medicare Other | Admitting: Family Medicine

## 2012-10-25 VITALS — BP 120/82 | Temp 98.7°F | Ht 67.25 in | Wt 149.0 lb

## 2012-10-25 DIAGNOSIS — R739 Hyperglycemia, unspecified: Secondary | ICD-10-CM

## 2012-10-25 DIAGNOSIS — G9332 Myalgic encephalomyelitis/chronic fatigue syndrome: Secondary | ICD-10-CM | POA: Insufficient documentation

## 2012-10-25 DIAGNOSIS — F419 Anxiety disorder, unspecified: Secondary | ICD-10-CM | POA: Insufficient documentation

## 2012-10-25 DIAGNOSIS — F341 Dysthymic disorder: Secondary | ICD-10-CM

## 2012-10-25 DIAGNOSIS — Z79890 Hormone replacement therapy (postmenopausal): Secondary | ICD-10-CM

## 2012-10-25 DIAGNOSIS — IMO0001 Reserved for inherently not codable concepts without codable children: Secondary | ICD-10-CM

## 2012-10-25 DIAGNOSIS — R7309 Other abnormal glucose: Secondary | ICD-10-CM

## 2012-10-25 DIAGNOSIS — F329 Major depressive disorder, single episode, unspecified: Secondary | ICD-10-CM | POA: Insufficient documentation

## 2012-10-25 DIAGNOSIS — M797 Fibromyalgia: Secondary | ICD-10-CM | POA: Insufficient documentation

## 2012-10-25 DIAGNOSIS — A6920 Lyme disease, unspecified: Secondary | ICD-10-CM | POA: Insufficient documentation

## 2012-10-25 DIAGNOSIS — R5382 Chronic fatigue, unspecified: Secondary | ICD-10-CM

## 2012-10-25 DIAGNOSIS — F32A Depression, unspecified: Secondary | ICD-10-CM

## 2012-10-25 NOTE — Patient Instructions (Addendum)
-  please schedule colonoscopy or sigmoidoscopy  -please see a counselor  -We have ordered labs or studies at this visit. It can take up to 1-2 weeks for results and processing. We will contact you with instructions IF your results are abnormal. Normal results will be released to your [facility]. If you have not heard from [place] or can not find your results in [facility] in 2 weeks please contact our office.  -PLEASE SIGN UP FOR MYCHART TODAY   We recommend the following healthy lifestyle measures: - eat a healthy diet consisting of lots of vegetables, fruits, beans, nuts, seeds, healthy meats such as white chicken and fish and whole grains.  - avoid fried foods, fast food, processed foods, sodas, red meet and other fattening foods.  - get a least 150 minutes of aerobic exercise per week.   Follow up in: 1 year or as needed

## 2012-10-25 NOTE — Progress Notes (Signed)
Chief Complaint  Patient presents with  . Establish Care    HPI:  Katie [name] is here to establish care.  Reviewed recent labs from ED for anxiety - tx with xanax - told to follow up with a PCP for elevated BP. Chronic hyponatremia mild and elevated blood sugar on labs in ED. Sees Dr. Reginia Forts (MD) [facility] for [facility]s, lyme disease, chronic fatigue, HRT. Has chronic fatigue syndrome and fibromyalgia - reports lyme's disease diagnosis too. Does part time work. On part time disability and this is managed by [facility] the [facility].Has had extensive lab work done and has positive testing for lyme titers. Had CBC, BMP, lipids, TSH in 07/2011 - reviewed. Last PCP and physical: [facility] (Dr. Laurie Panda) -had physical with pap and breast exam Feb 2014 and all normal  Has the following chronic problems and concerns today:  HTN: -reports was on BP medication (HCTZ)in the past -rarely has issues with blood pressure issues now with panic attacks -  A few times per year  Anxiety/Panic Disorder.Lexapro: -has a panic attacks a few times per -only has panic attacks 4-5 imes per year and uses xanax if under a lot of stress -has not had CBT or counseling -on lexapro - prescribed by Dr. Tonette Bihari (FNP) [facility]  HRT: -still has periods on hormones - followed by Dr. Laurie Panda -FDLMP: about 3-4 weeks ago, has every 28 days  Patient Active Problem List   Diagnosis Date Noted  . Lyme disease 10/25/2012  . Chronic fatigue syndrome 10/25/2012  . Anxiety and depression 10/25/2012  . Fibromyalgia 10/25/2012  . Hormone replacement therapy - followed by Dr. Laurie Panda in gyn and by [facility] 10/25/2012   Health Maintenance: -followed by gyn and [facility] -she is going to schedule colonoscopy ROS: See pertinent positives and negatives per HPI.  Past Medical History  Diagnosis Date  . Hypertension   . Lyme  disease   . Fibromyalgia   . Anxiety   . Lyme disease     Family History  Problem Relation Age of Onset  . Heart disease Mother     CHF  . Hypertension Mother   . Cancer Father 106    lung cancer  . Hypertension Paternal Grandmother   . Stroke Paternal Grandmother   . Hypertension Paternal Grandfather     History   Social History  . Marital Status: Divorced    Spouse Name: N/A    Number of Children: N/A  . Years of Education: N/A   Social History Main Topics  . Smoking status: Never Smoker   . Smokeless tobacco: None  . Alcohol Use: No     Comment: Ocaasional glass of wine  . Drug Use: No  . Sexually Active: Not Currently   Other Topics Concern  . None   Social History Narrative   Work or School: part time real estate      Home Situation: lives alone      Spiritual Beliefs: Christian - strong faith      Lifestyle: walking - 2-3 times per week; healthy diet             Current outpatient prescriptions:Acetylcysteine (N-ACETYL-L-CYSTEINE) 600 MG CAPS, Take by mouth. 1-2 as needed, Disp: , Rfl: ;  Alpha-Lipoic Acid 300 MG CAPS, Take by mouth. 1-2 per day, Disp: , Rfl: ;  calcitonin, salmon, (MIACALCIN/FORTICAL) 200 UNIT/ACT nasal spray, , Disp: , Rfl: ;  CALCIUM CITRATE PO, Take 1,000 mg by mouth. 5 per day, Disp: , Rfl:  Cholecalciferol (VITAMIN D-3) 5000 UNITS TABS, Take 5,000 tablets by mouth daily., Disp: , Rfl: ;  escitalopram (LEXAPRO) 20 MG tablet, Take 20 mg by mouth daily. , Disp: , Rfl: ;  fexofenadine-pseudoephedrine (ALLEGRA-D) 60-120 MG per tablet, Take 1 tablet by mouth daily as needed (for allergies)., Disp: , Rfl: ;  Indole-3-Carbinol POWD, by Does not apply route. 2 capsules daily, Disp: , Rfl:  MAGNESIUM CITRATE PO, Take 400 mg by mouth daily., Disp: , Rfl: ;  Multiple Vitamins-Minerals (ALIVE WOMENS ENERGY PO), Take 1 tablet by mouth daily., Disp: , Rfl: ;  OVER THE COUNTER MEDICATION, Best Ubiquinol featuring [place] QH 100 mg/60 softgels- qd, Disp:  , Rfl: ;  Saw Palmetto, Serenoa repens, (SAW PALMETTO PO), Take by mouth daily., Disp: , Rfl:  ALPRAZolam (XANAX) 0.5 MG tablet, Take 0.25 mg by mouth daily as needed for anxiety., Disp: , Rfl:   EXAM:  Filed Vitals:   10/25/12 1120  BP: 120/82  Temp: 98.7 F (37.1 C)    Body mass index is 23.17 kg/(m^2).  GENERAL: vitals reviewed and listed above, alert, oriented, appears well hydrated and in no acute distress  HEENT: atraumatic, conjunttiva clear, no obvious abnormalities on inspection of external nose and ears  NECK: no obvious masses on inspection  LUNGS: clear to auscultation bilaterally, no wheezes, rales or rhonchi, good air movement  CV: HRRR, no peripheral edema  MS: moves all extremities without noticeable abnormality  PSYCH: pleasant and cooperative, no obvious depression or anxiety  ASSESSMENT AND PLAN:  Discussed the following assessment and plan:  Lyme disease -followed by [facility]  Chronic fatigue syndrome -followed by [facility]  Anxiety and depression -followed by [facility] -advised she also see a counselor and get CBT -will refill xanax to use 4x per year for panic attacks, discussed risks if symptoms worsen will have her see psych  Fibromyalgia -followed by [facility]  Hyperglycemia - Plan: Hemoglobin A1c  Hormone replacement therapy - followed by Dr. Laurie Panda in gyn and by [facility]  Mild Hyponatremia: -likely due to SSRI as started when started this, stable -no symptoms  -We reviewed the PMH, PSH, FH, SH, Meds and Allergies. -We provided refills for any medications we will prescribe as needed. -We addressed current concerns per orders and patient instructions. -We have asked for records for pertinent exams, studies, vaccines and notes from previous providers. -We have advised patient to follow up per instructions below. ->45 minutes spent face  to face with this patient, labs from PCP at [facility] reviewed and placed in scan box to scan in  -Patient advised to return or notify a doctor immediately if symptoms worsen or persist or new concerns arise.  Patient Instructions  -please schedule colonoscopy or sigmoidoscopy  -please see a counselor  We recommend the following healthy lifestyle measures: - eat a healthy diet consisting of lots of vegetables, fruits, beans, nuts, seeds, healthy meats such as white chicken and fish and whole grains.  - avoid fried foods, fast food, processed foods, sodas, red meet and other fattening foods.  - get a least 150 minutes of aerobic exercise per week.       Kriste Basque R.

## 2012-10-27 ENCOUNTER — Encounter: Payer: Self-pay | Admitting: Family Medicine

## 2012-10-28 NOTE — Progress Notes (Signed)
Quick Note:  Called and spoke with pt and pt is aware. ______ 

## 2013-01-31 ENCOUNTER — Encounter: Payer: Self-pay | Admitting: Family Medicine

## 2013-01-31 NOTE — Progress Notes (Signed)
  Subjective:    Patient ID: Katie [name], female    DOB: 1955-08-05, 57 y.o.   MRN: [id number]  HPI    Review of Systems     Objective:   Physical Exam        Assessment & Plan:  thermogram done 07/03/2012. Reported by patient

## 2013-02-14 ENCOUNTER — Telehealth: Payer: Self-pay

## 2013-02-14 NOTE — Telephone Encounter (Signed)
Received a medication refill for alprazolam 0.5mg  take 1 tablet by mouth three times daily as needed for anxiety #20.

## 2013-02-15 NOTE — Telephone Encounter (Signed)
Pt states Dr Selena Batten told her she would refill. Pt states she got in the ED for high blood pressure. Pt states Dr Selena Batten knew this is not a regular RX. She only takes when needs. Pt states they discussed at visit and Dr Selena Batten agreed to fill . pls check office notes. Has had present script since may. Wants to talk w/ someone.

## 2013-02-15 NOTE — Telephone Encounter (Signed)
Left detailed message on machine for patient that Dr Tawanna Cooler is unable to fill the xanax because he does not prescribe this medication.  Informed patient that she should call the doctor who initially prescribed the medication.

## 2013-02-15 NOTE — Telephone Encounter (Signed)
Pt following up on request for ALPRAZolam Prudy Feeler) 0.5 MG tablet Pharm: [facility]/ [address]

## 2013-02-15 NOTE — Telephone Encounter (Signed)
Per my notes she agreed to use VERY rarely (4 times per year) for panic attack only. I agreed to this very limited use with the understanding that she get counseling and then if needed more often then this she would see psych as I do not typically prescribe this medication. Okay to give 5 tablets for rare use for panic attacks with no refills. If needs more frequently - needs to see pysch.

## 2013-02-16 MED ORDER — ALPRAZOLAM 0.5 MG PO TABS: 0.2500 mg | ORAL_TABLET | Freq: Every day | ORAL | Status: DC | PRN

## 2013-02-16 NOTE — Addendum Note (Signed)
Addended by: Kern Reap B on: 02/16/2013 12:43 PM   Modules accepted: Orders

## 2013-02-16 NOTE — Telephone Encounter (Signed)
ok 

## 2013-02-16 NOTE — Telephone Encounter (Signed)
Rx called into pharmacy.  Spoke with patient and she agrees.

## 2013-03-18 ENCOUNTER — Encounter: Payer: Self-pay | Admitting: *Deleted

## 2013-03-21 ENCOUNTER — Encounter: Payer: Medicare Other | Admitting: Family Medicine

## 2013-03-21 NOTE — Progress Notes (Signed)
error    This encounter was created in error - please disregard.

## 2013-04-05 ENCOUNTER — Encounter: Payer: Self-pay | Admitting: *Deleted

## 2013-04-06 ENCOUNTER — Ambulatory Visit (INDEPENDENT_AMBULATORY_CARE_PROVIDER_SITE_OTHER): Payer: Medicare Other | Admitting: Family Medicine

## 2013-04-06 ENCOUNTER — Encounter: Payer: Self-pay | Admitting: Family Medicine

## 2013-04-06 VITALS — BP 122/86 | Temp 98.5°F | Wt 154.0 lb

## 2013-04-06 DIAGNOSIS — M19049 Primary osteoarthritis, unspecified hand: Secondary | ICD-10-CM

## 2013-04-06 DIAGNOSIS — K219 Gastro-esophageal reflux disease without esophagitis: Secondary | ICD-10-CM

## 2013-04-06 NOTE — Patient Instructions (Signed)
    Gastroesophageal Reflux Disease, Adult Do a trial of lifestyle changes and prilosec then call us if not much better in 30 days   Gastroesophageal reflux disease (GERD) happens when acid from your stomach flows up into the esophagus. When acid comes in contact with the esophagus, the acid causes soreness (inflammation) in the esophagus. Over time, GERD may create small holes (ulcers) in the lining of the esophagus. CAUSES   Increased body weight. This puts pressure on the stomach, making acid rise from the stomach into the esophagus.  Smoking. This increases acid production in the stomach.  Drinking alcohol. This causes decreased pressure in the lower esophageal sphincter (valve or ring of muscle between the esophagus and stomach), allowing acid from the stomach into the esophagus.  Late evening meals and a full stomach. This increases pressure and acid production in the stomach.  A malformed lower esophageal sphincter. Sometimes, no cause is found. SYMPTOMS   Burning pain in the lower part of the mid-chest behind the breastbone and in the mid-stomach area. This may occur twice a week or more often.  Trouble swallowing.  Sore throat.  Dry cough.  Asthma-like symptoms including chest tightness, shortness of breath, or wheezing. DIAGNOSIS  Your caregiver may be able to diagnose GERD based on your symptoms. In some cases, X-rays and other tests may be done to check for complications or to check the condition of your stomach and esophagus. TREATMENT  Your caregiver may recommend over-the-counter or prescription medicines to help decrease acid production. Ask your caregiver before starting or adding any new medicines.  HOME CARE INSTRUCTIONS   Change the factors that you can control. Ask your caregiver for guidance concerning weight loss, quitting smoking, and alcohol consumption.  Avoid foods and drinks that make your symptoms worse, such as:  Caffeine or alcoholic  drinks.  Chocolate.  Peppermint or mint flavorings.  Garlic and onions.  Spicy foods.  Citrus fruits, such as oranges, lemons, or limes.  Tomato-based foods such as sauce, chili, salsa, and pizza.  Fried and fatty foods.  Avoid lying down for the 3 hours prior to your bedtime or prior to taking a nap.  Eat small, frequent meals instead of large meals.  Wear loose-fitting clothing. Do not wear anything tight around your waist that causes pressure on your stomach.  Raise the head of your bed 6 to 8 inches with wood blocks to help you sleep. Extra pillows will not help.  Only take over-the-counter or prescription medicines for pain, discomfort, or fever as directed by your caregiver.  Do not take aspirin, ibuprofen, or other nonsteroidal anti-inflammatory drugs (NSAIDs). SEEK IMMEDIATE MEDICAL CARE IF:   You have pain in your arms, neck, jaw, teeth, or back.  Your pain increases or changes in intensity or duration.  You develop nausea, vomiting, or sweating (diaphoresis).  You develop shortness of breath, or you faint.  Your vomit is green, yellow, black, or looks like coffee grounds or blood.  Your stool is red, bloody, or black. These symptoms could be signs of other problems, such as heart disease, gastric bleeding, or esophageal bleeding. MAKE SURE YOU:   Understand these instructions.  Will watch your condition.  Will get help right away if you are not doing well or get worse. Document Released: 01/29/2005 Document Revised: 07/14/2011 Document Reviewed: 11/08/2010 [facility] Patient Information 2014 [place], [place].

## 2013-04-06 NOTE — Progress Notes (Signed)
Pre visit review using our clinic review tool, if applicable. No additional management support is needed unless otherwise documented below in the visit note. 

## 2013-04-06 NOTE — Progress Notes (Signed)
No chief complaint on file.   HPI:  57 yo F with complicated PMH of anxiety, fibromyalgia, lyme disease, chronic fatigue on partial disability for this all managd by a holiistic MD (Dr. Reginia Forts) at [facility] [place]. On many supplements.She established care with me for assistance in management of her HTN. She is currently not on any medications for this.  Here today for:  GERD: -daily symptoms almost every day -heartburn and reflux, worse at night if lies down after eating -denies: denies fever, difficulty swallowing, dysphagia -afraid of taking medications as very sensitive to most medications  OA:  -several distal phalageal joints hands -chonic, pain occ - wonders about tx for this and gluten free diet for this  ROS: See pertinent positives and negatives per HPI.  Past Medical History  Diagnosis Date  . Hypertension   . Lyme disease   . Fibromyalgia   . Anxiety   . Lyme disease     No past surgical history on file.  Family History  Problem Relation Age of Onset  . Heart disease Mother     CHF  . Hypertension Mother   . Cancer Father 77    lung cancer  . Hypertension Paternal Grandmother   . Stroke Paternal Grandmother   . Hypertension Paternal Grandfather     History   Social History  . Marital Status: Divorced    Spouse Name: N/A    Number of Children: N/A  . Years of Education: N/A   Social History Main Topics  . Smoking status: Never Smoker   . Smokeless tobacco: None  . Alcohol Use: No     Comment: Ocaasional glass of wine  . Drug Use: No  . Sexual Activity: Not Currently   Other Topics Concern  . None   Social History Narrative   Work or School: part time real estate      Home Situation: lives alone      Spiritual Beliefs: Christian - strong faith      Lifestyle: walking - 2-3 times per week; healthy diet             Current outpatient prescriptions:Acetylcysteine (N-ACETYL-L-CYSTEINE) 600 MG CAPS, Take by mouth. 1-2 as  needed, Disp: , Rfl: ;  Alpha-Lipoic Acid 300 MG CAPS, Take by mouth. 1-2 per day, Disp: , Rfl: ;  calcitonin, salmon, (MIACALCIN/FORTICAL) 200 UNIT/ACT nasal spray, , Disp: , Rfl: ;  CALCIUM CITRATE PO, Take 1,000 mg by mouth. 5 per day, Disp: , Rfl:  Cholecalciferol (VITAMIN D-3) 5000 UNITS TABS, Take 5,000 tablets by mouth daily., Disp: , Rfl: ;  Indole-3-Carbinol POWD, by Does not apply route. 2 capsules daily, Disp: , Rfl: ;  MAGNESIUM CITRATE PO, Take 400 mg by mouth daily., Disp: , Rfl: ;  Multiple Vitamin (MULTIVITAMIN) tablet, Take 1 tablet by mouth daily., Disp: , Rfl: ;  NON FORMULARY, Estradiol 1 mg cream;   Sig:  According to [facility], Disp: , Rfl:  OVER THE COUNTER MEDICATION, Best Ubiquinol featuring [place] QH 100 mg/60 softgels- qd, Disp: , Rfl: ;  PRESCRIPTION MEDICATION, Progesterone cream 20mg /ml  Sig:  According to instructions for [facility], Disp: , Rfl: ;  Saw Palmetto, Serenoa repens, (SAW PALMETTO PO), Take by mouth daily., Disp: , Rfl:  ALPRAZolam (XANAX) 0.5 MG tablet, Take 0.5 tablets (0.25 mg total) by mouth daily as needed for anxiety., Disp: 5 tablet, Rfl: 0;  fexofenadine-pseudoephedrine (ALLEGRA-D) 60-120 MG per tablet, Take 1 tablet by  mouth daily as needed (for allergies)., Disp: , Rfl:   EXAM:  Filed Vitals:   04/06/13 1340  BP: 122/86  Temp: 98.5 F (36.9 C)    Body mass index is 23.95 kg/(m^2).  GENERAL: vitals reviewed and listed above, alert, oriented, appears well hydrated and in no acute distress  HEENT: atraumatic, conjunttiva clear, no obvious abnormalities on inspection of external nose and ears  NECK: no obvious masses on inspection  LUNGS: clear to auscultation bilaterally, no wheezes, rales or rhonchi, good air movement  CV: HRRR, no peripheral edema  MS: moves all extremities without noticeable abnormality  PSYCH: pleasant and cooperative, no obvious depression or anxiety  ASSESSMENT AND  PLAN:  Discussed the following assessment and plan:  GERD (gastroesophageal reflux disease)  Osteoarthritis of finger, unspecified laterality  -lifestyle recs and PPI for GERD, follow up if not resolve din 30 days and would see GI - discussed risks, risks untreated dz -for finger she will talk with holistic doctor about supplments and diet for this and may call for referral to ortho if worsens -Patient advised to return or notify a doctor immediately if symptoms worsen or persist or new concerns arise.  Patient Instructions     Gastroesophageal Reflux Disease, Adult Do a trial of lifestyle changes and prilosec then call us if not much better in 30 days   Gastroesophageal reflux disease (GERD) happens when acid from your stomach flows up into the esophagus. When acid comes in contact with the esophagus, the acid causes soreness (inflammation) in the esophagus. Over time, GERD may create small holes (ulcers) in the lining of the esophagus. CAUSES   Increased body weight. This puts pressure on the stomach, making acid rise from the stomach into the esophagus.  Smoking. This increases acid production in the stomach.  Drinking alcohol. This causes decreased pressure in the lower esophageal sphincter (valve or ring of muscle between the esophagus and stomach), allowing acid from the stomach into the esophagus.  Late evening meals and a full stomach. This increases pressure and acid production in the stomach.  A malformed lower esophageal sphincter. Sometimes, no cause is found. SYMPTOMS   Burning pain in the lower part of the mid-chest behind the breastbone and in the mid-stomach area. This may occur twice a week or more often.  Trouble swallowing.  Sore throat.  Dry cough.  Asthma-like symptoms including chest tightness, shortness of breath, or wheezing. DIAGNOSIS  Your caregiver may be able to diagnose GERD based on your symptoms. In some cases, X-rays and other tests may be  done to check for complications or to check the condition of your stomach and esophagus. TREATMENT  Your caregiver may recommend over-the-counter or prescription medicines to help decrease acid production. Ask your caregiver before starting or adding any new medicines.  HOME CARE INSTRUCTIONS   Change the factors that you can control. Ask your caregiver for guidance concerning weight loss, quitting smoking, and alcohol consumption.  Avoid foods and drinks that make your symptoms worse, such as:  Caffeine or alcoholic drinks.  Chocolate.  Peppermint or mint flavorings.  Garlic and onions.  Spicy foods.  Citrus fruits, such as oranges, lemons, or limes.  Tomato-based foods such as sauce, chili, salsa, and pizza.  Fried and fatty foods.  Avoid lying down for the 3 hours prior to your bedtime or prior to taking a nap.  Eat small, frequent meals instead of large meals.  Wear loose-fitting clothing. Do not wear anything tight around your waist  that causes pressure on your stomach.  Raise the head of your bed 6 to 8 inches with wood blocks to help you sleep. Extra pillows will not help.  Only take over-the-counter or prescription medicines for pain, discomfort, or fever as directed by your caregiver.  Do not take aspirin, ibuprofen, or other nonsteroidal anti-inflammatory drugs (NSAIDs). SEEK IMMEDIATE MEDICAL CARE IF:   You have pain in your arms, neck, jaw, teeth, or back.  Your pain increases or changes in intensity or duration.  You develop nausea, vomiting, or sweating (diaphoresis).  You develop shortness of breath, or you faint.  Your vomit is green, yellow, black, or looks like coffee grounds or blood.  Your stool is red, bloody, or black. These symptoms could be signs of other problems, such as heart disease, gastric bleeding, or esophageal bleeding. MAKE SURE YOU:   Understand these instructions.  Will watch your condition.  Will get help right away if you  are not doing well or get worse. Document Released: 01/29/2005 Document Revised: 07/14/2011 Document Reviewed: 11/08/2010 [facility] Patient Information 2014 [place], Lona Kettle, Dahlia Client R.

## 2013-04-07 ENCOUNTER — Telehealth: Payer: Self-pay | Admitting: Family Medicine

## 2013-04-07 NOTE — Telephone Encounter (Signed)
Pt has seen dr Caryl Never at [facility] yrs ago and did not realize MD was at [facility]. Pt would like to switch to [facility]

## 2013-04-07 NOTE — Telephone Encounter (Signed)
OK with me.

## 2013-04-07 NOTE — Telephone Encounter (Signed)
Ok with me 

## 2013-04-08 NOTE — Telephone Encounter (Signed)
Pt is aware.  

## 2013-05-16 ENCOUNTER — Other Ambulatory Visit: Payer: Self-pay | Admitting: Family Medicine

## 2013-05-16 NOTE — Telephone Encounter (Signed)
She has transferred care to me.  She will need to set up appointment with me at some point this year. Last Xanax she got ?5 tablets.   Does not appear she takes regularly.  Confirm.  OK to refill once.

## 2013-05-18 NOTE — Telephone Encounter (Signed)
Pt states she only takes as needed. Pt has some stress lately due to health issues. The 5 tabs Dr Maudie Mercury lasted her from Oct to now. Pt states one rx should get her through until her appt in July.  Pharm: [facility] [address]

## 2013-05-18 NOTE — Telephone Encounter (Signed)
Refill #20.

## 2013-05-18 NOTE — Telephone Encounter (Signed)
Pt confirmed that she does not take the medication regularly. Dr. Maudie Mercury only gave her #5. Pt stated that she is stressed. Give pt #5 or #30

## 2013-05-20 NOTE — Telephone Encounter (Signed)
Pt calling to report rx is not at the pharmacy yet, she is asking if rx can be called in this morning so she can pick it up this afternoon.

## 2013-07-14 ENCOUNTER — Ambulatory Visit (INDEPENDENT_AMBULATORY_CARE_PROVIDER_SITE_OTHER): Payer: Medicare Other | Admitting: Family Medicine

## 2013-07-14 ENCOUNTER — Encounter: Payer: Self-pay | Admitting: Family Medicine

## 2013-07-14 ENCOUNTER — Telehealth: Payer: Self-pay | Admitting: Family Medicine

## 2013-07-14 VITALS — BP 130/90 | HR 84 | Temp 97.8°F | Wt 154.0 lb

## 2013-07-14 DIAGNOSIS — A6920 Lyme disease, unspecified: Secondary | ICD-10-CM

## 2013-07-14 DIAGNOSIS — IMO0001 Reserved for inherently not codable concepts without codable children: Secondary | ICD-10-CM

## 2013-07-14 DIAGNOSIS — R030 Elevated blood-pressure reading, without diagnosis of hypertension: Secondary | ICD-10-CM

## 2013-07-14 MED ORDER — ALPRAZOLAM 0.5 MG PO TABS
0.5000 mg | ORAL_TABLET | Freq: Every day | ORAL | Status: DC | PRN
Start: 2013-07-14 — End: 2013-11-24

## 2013-07-14 NOTE — Telephone Encounter (Signed)
Pt coming in to be seen today ?

## 2013-07-14 NOTE — Telephone Encounter (Signed)
Patient Information:  Caller Name: Katie [name]  Phone: [phone]  Patient: Katie [name]  Gender: Female  DOB: 04-Feb-1956  Age: 58 Years  PCP: Carolann Littler (Family Practice)  Office Follow Up:  Does the office need to follow up with this patient?: No  Instructions For The Office: N/A   Symptoms  Reason For Call & Symptoms: Patient calling about High Blood Pressure; highest 192/109.  She took 1/2 Xanax tablet.  More recently 138/87 after medication.  She has appointment at 14:45 on 3/112/15.  She reports feeling weak.  See Today in Office per High Blood Pressure guideline due to BP > 180/110.  Moderate weakness reported.  Emergent symptoms ruled out  See Today in Office per Weakness and Fatigue guideline due to Patient wants to be seen.  Home care for the interim and parameters for callback given.  Caller states understanding of instructions.  Reviewed Health History In EMR: Yes  Reviewed Medications In EMR: Yes  Reviewed Allergies In EMR: Yes  Reviewed Surgeries / Procedures: Yes  Date of Onset of Symptoms: 07/14/2013  Treatments Tried: Xanax  Treatments Tried Worked: Yes  Guideline(s) Used:  High Blood Pressure  Weakness (Generalized) and Fatigue  Disposition Per Guideline:   See Today in Office  Reason For Disposition Reached:   Patient wants to be seen  Advice Given:  Call Back If:  Headache, blurred vision, difficulty talking, or difficulty walking occurs  Chest pain or difficulty breathing occurs  You become worse.  Call Back If:  Unable to stand or walk  Passes out  Breathing difficulty occurs  You become worse.  Patient Will Follow Care Advice:  YES

## 2013-07-14 NOTE — Progress Notes (Signed)
Subjective:    Patient ID: Katie [name], female    DOB: 1955-11-09, 58 y.o.   MRN: [id number]  Hypertension Pertinent negatives include no chest pain, headaches, palpitations or shortness of breath.   Patient seen for elevated blood pressure. I had seen her regularly several years ago prior to moving to this practice. She has just recently reestablished here. She is seen today with concerns for intermittent elevated blood pressure. She states that she was diagnosed with Lyme disease which she presumably picked up some time in childhood. She is currently being seen by integrative medicine physician in [place]. She attributes her intermittent spikes in blood pressure to her Lyme disease. She does not take any regular nonsteroidals. No regular alcohol use. No recent headaches. No chest pains. Occasional dyspnea with exertion. No diaphoresis.  She complains of generalized fatigue. She's had blood pressures that mostly range from 120/80 but has had elevations as high as 190/109 and she states she generally will have the spikes about once or twice per month. She has taken low-dose alprazolam 0.5 mg and this consistently brings her blood pressure back down. Denies any recent appetite or weight changes. Takes several supplements which are reviewed.  Past Medical History  Diagnosis Date  . Hypertension   . Lyme disease   . Fibromyalgia   . Anxiety   . Lyme disease    No past surgical history on file.  reports that she has never smoked. She does not have any smokeless tobacco history on file. She reports that she does not drink alcohol or use illicit drugs. family history includes Cancer (age of onset: 71) in her father; Heart disease in her mother; Hypertension in her mother, paternal grandfather, and paternal grandmother; Stroke in her paternal grandmother. Allergies  Allergen Reactions  . Penicillins     "childhood allergy"  . Sulfa Antibiotics     Caused white count to decrease       Review of Systems  Constitutional: Positive for fatigue. Negative for fever, chills and appetite change.  Eyes: Negative for visual disturbance.  Respiratory: Negative for cough, chest tightness, shortness of breath and wheezing.   Cardiovascular: Negative for chest pain, palpitations and leg swelling.  Gastrointestinal: Negative for abdominal pain.  Endocrine: Negative for polydipsia and polyuria.  Skin: Negative for rash.  Neurological: Positive for weakness. Negative for dizziness, seizures, syncope, light-headedness and headaches.  Hematological: Negative for adenopathy. Does not bruise/bleed easily.  Psychiatric/Behavioral: Negative for dysphoric mood.  All other systems reviewed and are negative.       Objective:   Physical Exam  Constitutional: She is oriented to person, place, and time. She appears well-developed and well-nourished.  Neck: Neck supple. No thyromegaly present.  Cardiovascular: Normal rate and regular rhythm.  Exam reveals no gallop.   No murmur heard. Pulmonary/Chest: Effort normal and breath sounds normal. No respiratory distress. She has no wheezes. She has no rales.  Musculoskeletal: She exhibits no edema.  Lymphadenopathy:    She has no cervical adenopathy.  Neurological: She is alert and oriented to person, place, and time.  Skin: No rash noted.  Psychiatric: She has a normal mood and affect. Her behavior is normal.          Assessment & Plan:  Elevated blood pressure by history from home readings. She has relatively normal reading here today. Repeat left arm seated 138/82. Clinically, she is not describing symptoms to suggest unusual etiology such as pheochromocytoma. Suspect anxiety is playing some role in  this. We discussed nonpharmacologic things to help control blood pressure. Generally her blood pressure is well controlled. She has previously taken low-dose alprazolam rarely about once or twice per month when this spikes and we have  agreed to one refill. We've recommended against regular use. Continue close monitoring. Be in touch if she is consistently getting over 140/90  Reported history of Lyme disease.  Currently followed by integrative physician in [place]

## 2013-08-26 ENCOUNTER — Ambulatory Visit (INDEPENDENT_AMBULATORY_CARE_PROVIDER_SITE_OTHER): Payer: Medicare Other | Admitting: Family Medicine

## 2013-08-26 ENCOUNTER — Encounter: Payer: Self-pay | Admitting: Family Medicine

## 2013-08-26 ENCOUNTER — Telehealth: Payer: Self-pay | Admitting: Family Medicine

## 2013-08-26 VITALS — BP 130/88 | HR 80 | Temp 98.0°F | Wt 157.0 lb

## 2013-08-26 DIAGNOSIS — R1032 Left lower quadrant pain: Secondary | ICD-10-CM

## 2013-08-26 MED ORDER — LEVOFLOXACIN 750 MG PO TABS
750.0000 mg | ORAL_TABLET | Freq: Every day | ORAL | Status: DC
Start: 2013-08-26 — End: 2013-12-02

## 2013-08-26 NOTE — Telephone Encounter (Signed)
Pt scheduled today at 3:15, ok per Montrice//kar

## 2013-08-26 NOTE — Progress Notes (Signed)
Subjective:    Patient ID: Katie [name], female    DOB: [birth year], 58 y.o.   MRN: [id number]  HPI Comments: Patient is a 58 year old female who is following up regarding an urgent care appointment yesterday. She was experiencing intense left lower quadrant pain which is exacerbated by movement. Associated symptoms were fever, gas, constipation and bloating. She states she has never had anything like this before. She was diagnosed with diverticulitis and was given metronidazole and ciprofloxacin. She has only taken metronidazole for fear of a reaction to taking the medications. She states she has a history of being very sensitive to medications. Patient states her pain has improved tremendously and fever is now gone. However, she is now experiencing a bilateral dull headache across her forehead and the top of her head. The headache begins right after taking metronidazole but decreases in intensity throughout the day. No history of head trauma or aura. She was also advised to adhere to a liquid diet of gatorade and juices but feels weak and as if she is not getting adequate nutrition. Patient is planning on having a colonoscopy in the next few months.        Review of Systems  Constitutional: Positive for fatigue. Negative for fever and chills.  HENT: Positive for congestion. Negative for rhinorrhea, sinus pressure, sneezing, sore throat, tinnitus and trouble swallowing.   Respiratory: Negative for cough and shortness of breath.   Cardiovascular: Negative for chest pain and palpitations.  Gastrointestinal: Positive for abdominal pain and diarrhea. Negative for nausea, vomiting and constipation.       Abdominal pain is much improved since treated yesterday with metronidazole.   Musculoskeletal: Negative for gait problem.  Neurological: Positive for light-headedness and headaches. Negative for syncope.       Objective:   Physical Exam  Constitutional: She appears well-developed and  well-nourished.  HENT:  Head: Normocephalic.  Mouth/Throat: Oropharynx is clear and moist and mucous membranes are normal.  Eyes: Conjunctivae are normal. Pupils are equal, round, and reactive to light.  Cardiovascular: Normal rate, normal heart sounds and intact distal pulses.   Pulmonary/Chest: Effort normal and breath sounds normal.  Abdominal: Soft. Normal appearance. Bowel sounds are increased. There is tenderness in the left lower quadrant.  Lymphadenopathy:       Head (right side): No submental, no submandibular, no tonsillar, no preauricular and no posterior auricular adenopathy present.       Head (left side): No submental, no submandibular, no tonsillar, no preauricular and no posterior auricular adenopathy present.    She has no cervical adenopathy.       Right: No supraclavicular adenopathy present.       Left: No supraclavicular adenopathy present.  Neurological: She is alert.  Skin: Skin is warm and dry.          Assessment & Plan:  #1 Diverticulitis Patient advised to continue medications as prescribed for complete coverage. Prescribed levaquin instead of cipro as an alternate medication to ease patient anxiety. Patient given education handout on diverticulitis and encouraged to eat a diet with low residue, soft foods like crackers and clear soups. If pain is not improving in one week, imaging will be done to assess for other sources of the pain such as ovarian cancer. Patient is to schedule a colonoscopy in the future when she is not having an active flare.   [place], PA-S  Pt seen with presumptive dx of "diverticulitis" at urgent care and suspect this is likely  dx.  Never had colonoscopy and no confirmed hx of diverticulitis.  Improved with metronidazole but still slightly tender with NO acute abdomen.  She never took the Cipro (for fear of side effects) though she has never had intolerance.  We explained rationale for combination antibiotic therapy.  She is  encouraged to get colonoscopy after acute symptoms subside.  She is much improved today and will f/u promptly for fever or increased abdominal pain,  Carolann Littler, MD

## 2013-08-26 NOTE — Telephone Encounter (Signed)
Pt was at urgent card on 08/25/13 for left side pain was diagnosed with diverticulitis she was put on antibiotics she req to come in today to see Dr Elease Hashimoto can he work her in later today

## 2013-08-26 NOTE — Patient Instructions (Addendum)
Diverticulitis A diverticulum is a small pouch or sac on the colon. Diverticulosis is the presence of these diverticula on the colon. Diverticulitis is the irritation (inflammation) or infection of diverticula. CAUSES  The colon and its diverticula contain bacteria. If food particles block the tiny opening to a diverticulum, the bacteria inside can grow and cause an increase in pressure. This leads to infection and inflammation and is called diverticulitis. SYMPTOMS   Abdominal pain and tenderness. Usually, the pain is located on the left side of your abdomen. However, it could be located elsewhere.  Fever.  Bloating.  Feeling sick to your stomach (nausea).  Throwing up (vomiting).  Abnormal stools. DIAGNOSIS  Your caregiver will take a history and perform a physical exam. Since many things can cause abdominal pain, other tests may be necessary. Tests may include:  Blood tests.  Urine tests.  X-ray of the abdomen.  CT scan of the abdomen. Sometimes, surgery is needed to determine if diverticulitis or other conditions are causing your symptoms. TREATMENT  Most of the time, you can be treated without surgery. Treatment includes:  Resting the bowels by only having liquids for a few days. As you improve, you will need to eat a low-fiber diet.  Intravenous (IV) fluids if you are losing body fluids (dehydrated).  Antibiotic medicines that treat infections may be given.  Pain and nausea medicine, if needed.  Surgery if the inflamed diverticulum has burst. HOME CARE INSTRUCTIONS   Try a clear liquid diet (broth, tea, or water for as long as directed by your caregiver). You may then gradually begin a low-fiber diet as tolerated.  A low-fiber diet is a diet with less than 10 grams of fiber. Choose the foods below to reduce fiber in the diet:  White breads, cereals, rice, and pasta.  Cooked fruits and vegetables or soft fresh fruits and vegetables without the skin.  Ground or  well-cooked tender beef, ham, veal, lamb, pork, or poultry.  Eggs and seafood.  After your diverticulitis symptoms have improved, your caregiver may put you on a high-fiber diet. A high-fiber diet includes 14 grams of fiber for every 1000 calories consumed. For a standard 2000 calorie diet, you would need 28 grams of fiber. Follow these diet guidelines to help you increase the fiber in your diet. It is important to slowly increase the amount fiber in your diet to avoid gas, constipation, and bloating.  Choose whole-grain breads, cereals, pasta, and brown rice.  Choose fresh fruits and vegetables with the skin on. Do not overcook vegetables because the more vegetables are cooked, the more fiber is lost.  Choose more nuts, seeds, legumes, dried peas, beans, and lentils.  Look for food products that have greater than 3 grams of fiber per serving on the Nutrition Facts label.  Take all medicine as directed by your caregiver.  If your caregiver has given you a follow-up appointment, it is very important that you go. Not going could result in lasting (chronic) or permanent injury, pain, and disability. If there is any problem keeping the appointment, call to reschedule. SEEK MEDICAL CARE IF:   Your pain does not improve.  You have a hard time advancing your diet beyond clear liquids.  Your bowel movements do not return to normal. SEEK IMMEDIATE MEDICAL CARE IF:   Your pain becomes worse.  You have an oral temperature above 102 F (38.9 C), not controlled by medicine.  You have repeated vomiting.  You have bloody or black, tarry stools.    Symptoms that brought you to your caregiver become worse or are not getting better. MAKE SURE YOU:   Understand these instructions.  Will watch your condition.  Will get help right away if you are not doing well or get worse. Document Released: 01/29/2005 Document Revised: 07/14/2011 Document Reviewed: 05/27/2010 [facility] Patient Information  2014 [place].   Low-Fiber Diet Fiber is found in fruits, vegetables, and grains. A low-fiber diet restricts fibrous foods that are not digested in the small intestine. A diet containing about 10 grams of fiber is considered low fiber.  PURPOSE  To prevent blockage of a partially obstructed or narrowed gastrointestinal tract.  To reduce fecal weight and volume.  To slow the movement of feces. WHEN IS THIS DIET USED?  It may be used during the acute phase of Crohn disease, ulcerative colitis, regional enteritis, or diverticulitis.  It may be used if your intestinal or esophageal tubes are narrowing (stenosis).  It may be used as a transitional diet following surgery, injury (trauma), or illness. CHOOSING FOODS Check labels, especially on foods from the starch list. Often times, dietary fiber content is listed on the nutrition facts panel. Please ask your Registered Dietitian if you have questions about specific foods that are related to your condition, especially if the food is not listed on this handout. Breads and Starches  Allowed: White, [place], and pita breads, plain rolls, buns, or sweet rolls, doughnuts, waffles, pancakes, bagels. Plain muffins, biscuits, matzoth. Soda, saltine, graham crackers. Pretzels, rusks, melba toast, zwieback. Cooked cereals: cornmeal, farina, or cream cereals. Dry cereals: refined corn, wheat, rice, and oat cereals (check label). Potatoes prepared any way without skins, refined macaroni, spaghetti, noodles, refined rice.  Avoid: Whole-wheat bread, rolls, and crackers. Multigrains, rye, bran seeds, nuts, or coconut. Cereals containing whole grains, multigrains, bran, coconut, nuts, raisins. Cooked or dry oatmeal. Coarse wheat cereals, granola. Cereals advertised as "high fiber." Potato skins. Whole-grain pasta, wild or brown rice. Popcorn. Vegetables  Allowed: Strained tomato and vegetable juices. Fresh lettuce, cucumber, spinach. Well-cooked or  canned: asparagus, bean sprouts, broccoli, cut green beans, cauliflower, pumpkin, beets, mushrooms, yellow squash, tomato, tomato sauce, zucchini, turnips.Keep servings limited to  cup.  Avoid: Fresh, cooked, or canned: artichokes, baked beans, beet greens, Brussels sprouts, corn, kale, legumes, peas, sweet potatoes. Avoid large servings of any vegetables. Fruit  Allowed: All fruit juices except prune juice. Cooked or canned fruits without skin and seeds: apricots, applesauce, cantaloupe, cherries, grapefruit, grapes, kiwi, mandarin oranges, peaches, pears, fruit cocktail, pineapple, plums, watermelon. Fresh without skin: banana, grapes, cantaloupe, avocado, cherries, pineapple, kiwi, nectarines, peaches, blueberries. Keep servings limited to  cup or 1 piece.  Avoid: Fresh: apples with or without skin, apricots, mangoes, pears, raspberries, strawberries. Prune juice and juices with pulp, stewed or dried prunes. Dried fruits, raisins, dates. Avoid large servings of all fresh fruits. Meat and Protein Substitutes  Allowed: Ground or well-cooked tender beef, ham, veal, lamb, pork, poultry. Eggs, plain cheese. Fish, oysters, shrimp, lobster, other seafood. Liver, organ meats. Smooth nut butters.  Avoid: Tough, fibrous meats with gristle. Chunky nut butter.Cheese with seeds, nuts, or other foods not allowed. Nuts, seeds, legumes, dried peas, beans, lentils. Dairy  Allowed: All milk products except those not allowed.  Avoid: Yogurt or cheese that contains nuts, seeds, or added fruit. Soups and Combination Foods  Allowed: Bouillon, broth, or cream soups made from allowed foods. Any strained soup. Casseroles or mixed dishes made with allowed foods.  Avoid: Soups made from vegetables that are  not allowed or that contain other foods not allowed. Desserts and Sweets  Allowed:Plain cakes and cookies, pie made with allowed fruit, pudding, custard, cream pie. Gelatin, fruit, ice, sherbet, frozen ice  pops. Ice cream, ice milk without nuts. Plain hard candy, honey, jelly, molasses, syrup, sugar, chocolate syrup, gumdrops, marshmallows.  Avoid: Desserts, cookies, or candies that contain nuts, peanut butter, dried fruits. Jams, preserves with seeds, marmalade. Fats and Oils  Allowed:Margarine, butter, cream, mayonnaise, salad oils, plain salad dressings made from allowed foods.  Avoid: Seeds, nuts, olives. Beverages  Allowed: All, except those listed to avoid.  Avoid: Fruit juices with high pulp, prune juice. Condiments  Allowed:Ketchup, mustard, horseradish, vinegar, cream sauce, cheese sauce, cocoa powder. Spices in moderation: allspice, basil, bay leaves, celery powder or leaves, cinnamon, cumin powder, curry powder, ginger, mace, marjoram, onion or garlic powder, oregano, paprika, parsley flakes, ground pepper, rosemary, sage, savory, tarragon, thyme, turmeric.  Avoid: Coconut, pickles. SAMPLE MENU Breakfast   cup orange juice.  1 boiled egg.  1 slice white toast.  Margarine.   cup cornflakes.  1 cup milk.  Beverage. Lunch   cup chicken noodle soup.  2 to 3 oz sliced roast beef.  2 slices white bread.  Mayonnaise.   cup tomato juice.  1 small banana.  Beverage. Dinner  3 oz baked chicken.   cup scalloped potatoes.   cup cooked beets.  White dinner roll.  Margarine.   cup canned peaches.  Beverage. Document Released: 10/11/2001 Document Revised: 12/22/2012 Document Reviewed: 05/08/2011 [facility] Patient Information 2014 [place].

## 2013-08-26 NOTE — Progress Notes (Signed)
Pre visit review using our clinic review tool, if applicable. No additional management support is needed unless otherwise documented below in the visit note. 

## 2013-11-09 ENCOUNTER — Encounter: Payer: Medicare Other | Admitting: Family Medicine

## 2013-11-14 ENCOUNTER — Encounter: Payer: Medicare Other | Admitting: Family Medicine

## 2013-11-16 ENCOUNTER — Encounter: Payer: Medicare Other | Admitting: Family Medicine

## 2013-11-23 ENCOUNTER — Encounter: Payer: Medicare Other | Admitting: Family Medicine

## 2013-11-24 ENCOUNTER — Telehealth: Payer: Self-pay | Admitting: Family Medicine

## 2013-11-24 MED ORDER — ALPRAZOLAM 0.5 MG PO TABS: 0.5000 mg | ORAL_TABLET | Freq: Every day | ORAL | Status: DC | PRN

## 2013-11-24 NOTE — Telephone Encounter (Signed)
RX called in to the pharmacy.  

## 2013-11-24 NOTE — Telephone Encounter (Signed)
Refill once 

## 2013-11-24 NOTE — Telephone Encounter (Signed)
Pt request refill of the following:  ALPRAZolam (XANAX) 0.5 MG tablet   Phamacy:  [facility] [place]

## 2013-11-24 NOTE — Telephone Encounter (Signed)
Last visit 08/26/13 Last refill 07/14/13 #30 1 refill

## 2013-12-02 ENCOUNTER — Encounter: Payer: Self-pay | Admitting: Family Medicine

## 2013-12-02 ENCOUNTER — Ambulatory Visit (INDEPENDENT_AMBULATORY_CARE_PROVIDER_SITE_OTHER): Payer: Medicare Other | Admitting: Family Medicine

## 2013-12-02 VITALS — BP 160/90 | HR 87 | Temp 98.3°F | Resp 20 | Ht 67.5 in | Wt 157.0 lb

## 2013-12-02 DIAGNOSIS — Z0000 Encounter for general adult medical examination without abnormal findings: Secondary | ICD-10-CM

## 2013-12-02 DIAGNOSIS — IMO0001 Reserved for inherently not codable concepts without codable children: Secondary | ICD-10-CM

## 2013-12-02 DIAGNOSIS — R002 Palpitations: Secondary | ICD-10-CM

## 2013-12-02 DIAGNOSIS — R030 Elevated blood-pressure reading, without diagnosis of hypertension: Secondary | ICD-10-CM

## 2013-12-02 NOTE — Progress Notes (Signed)
Pre visit review using our clinic review tool, if applicable. No additional management support is needed unless otherwise documented below in the visit note. 

## 2013-12-02 NOTE — Progress Notes (Signed)
Subjective:    Patient ID: Katie [name], female    DOB: [birth year], 58 y.o.   MRN: [id number]  HPI Patient here for complete physical. She sees gynecologist regularly. Her chronic problems include history of elevated blood pressure (currently not treated), and osteoporosis. She has seen "holistic" doctor in the past and had concern for possible Lyme disease and has been through some type of treatment for that.  She has elevated blood pressure history and apparently her blood pressure is very inconsistent- sometimes around 509 systolic and sometimes up to 160. No recent headaches. She has had some recent palpitations which occur inconsistently. These generally happen several times per week. No associated chest pain. No clear triggers. Minimal caffeine use. Occasional mild lightheadedness but no syncope. No exertional symptoms.  She has not had prior colonoscopy screening but is willing to get this set up.  Past Medical History  Diagnosis Date  . Hypertension   . Lyme disease   . Fibromyalgia   . Anxiety   . Lyme disease    No past surgical history on file.  reports that she has never smoked. She does not have any smokeless tobacco history on file. She reports that she does not drink alcohol or use illicit drugs. family history includes Cancer (age of onset: 6) in her father; Heart disease in her mother; Hypertension in her mother, paternal grandfather, and paternal grandmother; Stroke in her paternal grandmother. Allergies  Allergen Reactions  . Penicillins     "childhood allergy"  . Sulfa Antibiotics     Caused white count to decrease      Review of Systems  Constitutional: Positive for fatigue. Negative for fever, activity change, appetite change and unexpected weight change.  HENT: Negative for ear pain, hearing loss, sore throat and trouble swallowing.   Eyes: Negative for visual disturbance.  Respiratory: Negative for cough and shortness of breath.   Cardiovascular:  Positive for palpitations. Negative for chest pain.  Gastrointestinal: Negative for abdominal pain, diarrhea, constipation and blood in stool.  Genitourinary: Negative for dysuria and hematuria.  Musculoskeletal: Negative for arthralgias, back pain and myalgias.  Skin: Negative for rash.  Neurological: Negative for dizziness, syncope and headaches.  Hematological: Negative for adenopathy. Does not bruise/bleed easily.  Psychiatric/Behavioral: Negative for confusion and dysphoric mood.       Objective:   Physical Exam  Constitutional: She is oriented to person, place, and time. She appears well-developed and well-nourished.  HENT:  Head: Normocephalic and atraumatic.  Eyes: EOM are normal. Pupils are equal, round, and reactive to light.  Neck: Normal range of motion. Neck supple. No thyromegaly present.  Cardiovascular: Normal rate, regular rhythm and normal heart sounds.   No murmur heard. Pulmonary/Chest: Breath sounds normal. No respiratory distress. She has no wheezes. She has no rales.  Abdominal: Soft. Bowel sounds are normal. She exhibits no distension and no mass. There is no tenderness. There is no rebound and no guarding.  Genitourinary:  Per GYN  Musculoskeletal: Normal range of motion. She exhibits no edema.  Lymphadenopathy:    She has no cervical adenopathy.  Neurological: She is alert and oriented to person, place, and time. She displays normal reflexes. No cranial nerve deficit.  Skin: No rash noted.  Psychiatric: She has a normal mood and affect. Her behavior is normal. Judgment and thought content normal.          Assessment & Plan:  #1 health maintenance. Set up screening colonoscopy referral. Labs reviewed from recent outside  lab and these are all favorable. Tetanus up-to-date #2 palpitations. She has some mild associated lightheadedness. Multiple previous EKGs which have been unremarkable. Set up event monitor #3 hypertension. She is very reluctant to  consider medications. We have agreed to 2 month trial of regular aerobic exercise and reassess blood pressure 2 months.  Consider ARB at that time if still elevated.

## 2013-12-02 NOTE — Patient Instructions (Signed)
Start regular aerobic exercise. Monitor blood pressure and be in touch if consistently > 160/90 We will call you with GI referral for colonoscopy.

## 2013-12-19 ENCOUNTER — Encounter: Payer: Self-pay | Admitting: *Deleted

## 2013-12-19 ENCOUNTER — Encounter (INDEPENDENT_AMBULATORY_CARE_PROVIDER_SITE_OTHER): Payer: Medicare Other

## 2013-12-19 DIAGNOSIS — R002 Palpitations: Secondary | ICD-10-CM

## 2013-12-19 NOTE — Progress Notes (Signed)
Patient ID: Katie [name], female   DOB: [birth year], 58 y.o.   MRN: [id number] E-Cardio Verite 30 day cardiac event monitor applied to patient.

## 2014-01-06 ENCOUNTER — Other Ambulatory Visit: Payer: Self-pay | Admitting: Family Medicine

## 2014-01-06 NOTE — Telephone Encounter (Signed)
Last visit 12/02/13 Last refill 11/24/13 #30 0 refill

## 2014-01-10 NOTE — Telephone Encounter (Signed)
Refill once.  Try to avoid regular use. 

## 2014-01-31 ENCOUNTER — Encounter: Payer: Self-pay | Admitting: Family Medicine

## 2014-01-31 ENCOUNTER — Ambulatory Visit (INDEPENDENT_AMBULATORY_CARE_PROVIDER_SITE_OTHER): Payer: Medicare Other | Admitting: Family Medicine

## 2014-01-31 VITALS — BP 128/80 | HR 86 | Wt 153.0 lb

## 2014-01-31 DIAGNOSIS — R030 Elevated blood-pressure reading, without diagnosis of hypertension: Secondary | ICD-10-CM

## 2014-01-31 DIAGNOSIS — IMO0001 Reserved for inherently not codable concepts without codable children: Secondary | ICD-10-CM

## 2014-01-31 DIAGNOSIS — R002 Palpitations: Secondary | ICD-10-CM

## 2014-01-31 NOTE — Progress Notes (Signed)
   Subjective:    Patient ID: Katie [name], female    DOB: 1955-05-10, 58 y.o.   MRN: [id number]  HPI Patient seen for medical followup. History of possible Lyme disease. She's been treated by specialty clinic in [place] for this. Was seen here last visit was having frequent palpitations and also somewhat labile blood pressure. She had elevated reading here. We set up a Holter monitor which came back normal and her symptoms did not correlate with any arrhythmias. She avoids caffeine.  Her blood pressure been very stable by home readings mostly 935 systolic and 70 diastolic. She started taking a electrolyte replacement which basically has a very low amount potassium and magnesium and she thinks has helped her blood pressure. No headaches. No recent fatigue issues. She started exercising 30 minutes daily with exercise bike.  No excessive ETOH.  Past Medical History  Diagnosis Date  . Hypertension   . Lyme disease   . Fibromyalgia   . Anxiety   . Lyme disease    No past surgical history on file.  reports that she has never smoked. She does not have any smokeless tobacco history on file. She reports that she does not drink alcohol or use illicit drugs. family history includes Cancer (age of onset: 62) in her father; Heart disease in her mother; Hypertension in her mother, paternal grandfather, and paternal grandmother; Stroke in her paternal grandmother. Allergies  Allergen Reactions  . Penicillins     "childhood allergy"  . Sulfa Antibiotics     Caused white count to decrease      Review of Systems  Constitutional: Negative for fatigue.  Eyes: Negative for visual disturbance.  Respiratory: Negative for cough, chest tightness, shortness of breath and wheezing.   Cardiovascular: Negative for chest pain, palpitations and leg swelling.  Neurological: Negative for dizziness, seizures, syncope, weakness, light-headedness and headaches.       Objective:   Physical Exam    Constitutional: She appears well-developed and well-nourished.  Cardiovascular: Normal rate and regular rhythm.  Exam reveals no gallop.   No murmur heard. Pulmonary/Chest: Effort normal and breath sounds normal. No respiratory distress. She has no wheezes. She has no rales.  Musculoskeletal: She exhibits no edema.          Assessment & Plan:  #1 palpitations. Recent event monitor unremarkable and did not correlate with symptoms. Reassurance. Continue to avoid caffeine.  Symptoms currently stable #2 elevated blood pressure. She has normal BP today by couple of readings. She's also had recent normal home readings. Continue regular aerobic exercise. Continue monitoring.

## 2014-01-31 NOTE — Progress Notes (Signed)
Pre visit review using our clinic review tool, if applicable. No additional management support is needed unless otherwise documented below in the visit note. 

## 2014-01-31 NOTE — Patient Instructions (Addendum)
Continue to monitor blood pressure and be in touch if consistently > 140/90 

## 2014-02-01 ENCOUNTER — Ambulatory Visit: Payer: Medicare Other | Admitting: Family Medicine

## 2014-03-10 ENCOUNTER — Telehealth: Payer: Self-pay | Admitting: Family Medicine

## 2014-03-10 ENCOUNTER — Other Ambulatory Visit: Payer: Self-pay | Admitting: Family Medicine

## 2014-03-10 NOTE — Telephone Encounter (Signed)
Last visit 01/31/14 Last refill 01/10/14 #30 0 refill

## 2014-03-10 NOTE — Telephone Encounter (Signed)
Pt request refill ALPRAZolam (XANAX) 0.5 MG tablet [facility] [address]

## 2014-03-12 NOTE — Telephone Encounter (Signed)
Refill once.  Avoid regular use. 

## 2014-03-13 NOTE — Telephone Encounter (Signed)
Rx called in to pharmacy. 

## 2014-04-21 ENCOUNTER — Telehealth: Payer: Self-pay | Admitting: Family Medicine

## 2014-04-21 NOTE — Telephone Encounter (Signed)
Pt request refill of the following: ALPRAZolam Duanne Moron) 0.5 MG tablet   Phamacy: [facility]

## 2014-04-21 NOTE — Telephone Encounter (Signed)
Last visit 01/31/14 Last refill 03/13/14 #30 0 refill

## 2014-04-23 NOTE — Telephone Encounter (Signed)
Refill once.  Try to avoid regular use. 

## 2014-04-24 MED ORDER — ALPRAZOLAM 0.5 MG PO TABS: ORAL_TABLET | ORAL | Status: DC

## 2014-04-24 NOTE — Telephone Encounter (Signed)
Rx called in to pharmacy. 

## 2014-05-17 ENCOUNTER — Ambulatory Visit: Payer: Medicare Other | Admitting: Family Medicine

## 2014-05-24 ENCOUNTER — Telehealth: Payer: Self-pay | Admitting: Family Medicine

## 2014-05-24 MED ORDER — ALPRAZOLAM 0.5 MG PO TABS: ORAL_TABLET | ORAL | Status: DC

## 2014-05-24 NOTE — Telephone Encounter (Signed)
Refill OK

## 2014-05-24 NOTE — Telephone Encounter (Signed)
Faxed Rx to pharmacy  

## 2014-05-24 NOTE — Telephone Encounter (Signed)
Last visit 01/31/14 Last refill 04/24/14 #30 0 refill

## 2014-05-24 NOTE — Telephone Encounter (Signed)
Pt request refill ALPRAZolam (XANAX) 0.5 MG tablet [facility] [address] in [place]  (New pharm for pt)

## 2014-06-30 ENCOUNTER — Other Ambulatory Visit: Payer: Self-pay | Admitting: Family Medicine

## 2014-06-30 MED ORDER — ALPRAZOLAM 0.5 MG PO TABS: ORAL_TABLET | ORAL | Status: DC

## 2014-06-30 NOTE — Telephone Encounter (Signed)
Last visit 01/31/14 Last refill 05/24/14 #30 0 refill

## 2014-06-30 NOTE — Telephone Encounter (Signed)
Pt needs refill on alprazolam call [facility] [address] [place]. Pt needs med today

## 2014-06-30 NOTE — Telephone Encounter (Signed)
Rx sent to pharmacy   

## 2014-06-30 NOTE — Telephone Encounter (Signed)
Refill with 2 additional refills. 

## 2014-08-01 ENCOUNTER — Telehealth: Payer: Self-pay | Admitting: Family Medicine

## 2014-08-01 NOTE — Telephone Encounter (Signed)
Pt would like a referral to see derm for spot on nose. Pt has medicare pri. Pt also needs refill on alprazolam [facility] [employer]

## 2014-08-01 NOTE — Telephone Encounter (Signed)
Pt was given #30 2 refills on 06/30/14.

## 2014-08-02 ENCOUNTER — Other Ambulatory Visit: Payer: Self-pay | Admitting: Family Medicine

## 2014-08-02 DIAGNOSIS — J340 Abscess, furuncle and carbuncle of nose: Secondary | ICD-10-CM

## 2014-08-02 NOTE — Telephone Encounter (Signed)
OK to refer to Derm. Not due for refill on Xanax yet.

## 2014-08-02 NOTE — Telephone Encounter (Signed)
Left message for patient to return call.

## 2014-08-02 NOTE — Telephone Encounter (Signed)
Referral is ordered. Rx is ready for pickup

## 2014-10-10 ENCOUNTER — Ambulatory Visit (INDEPENDENT_AMBULATORY_CARE_PROVIDER_SITE_OTHER): Payer: Medicare Other | Admitting: Family Medicine

## 2014-10-10 ENCOUNTER — Telehealth: Payer: Self-pay

## 2014-10-10 ENCOUNTER — Encounter: Payer: Self-pay | Admitting: Family Medicine

## 2014-10-10 VITALS — BP 126/90 | HR 70 | Temp 98.0°F | Wt 148.0 lb

## 2014-10-10 DIAGNOSIS — R1011 Right upper quadrant pain: Secondary | ICD-10-CM

## 2014-10-10 DIAGNOSIS — J309 Allergic rhinitis, unspecified: Secondary | ICD-10-CM

## 2014-10-10 LAB — HEPATIC FUNCTION PANEL
ALBUMIN: 4.2 g/dL (ref 3.5–5.2)
ALT: 13 U/L (ref 0–35)
AST: 15 U/L (ref 0–37)
Alkaline Phosphatase: 62 U/L (ref 39–117)
Bilirubin, Direct: 0.2 mg/dL (ref 0.0–0.3)
TOTAL PROTEIN: 7.4 g/dL (ref 6.0–8.3)
Total Bilirubin: 0.9 mg/dL (ref 0.2–1.2)

## 2014-10-10 MED ORDER — DOXYCYCLINE HYCLATE 100 MG PO CAPS: 100.0000 mg | ORAL_CAPSULE | Freq: Two times a day (BID) | ORAL | Status: DC

## 2014-10-10 NOTE — Telephone Encounter (Signed)
Xanax Last visit 10/10/14 Last refill 06/30/14 #30 2 refill   [facility]

## 2014-10-10 NOTE — Progress Notes (Signed)
Pre visit review using our clinic review tool, if applicable. No additional management support is needed unless otherwise documented below in the visit note. 

## 2014-10-10 NOTE — Progress Notes (Signed)
   Subjective:    Patient ID: Katie [name], female    DOB: Jun 16, 1955, 59 y.o.   MRN: [id number]  HPI   Patient seen with the following issues:  She relates some bilateral maxillary and frontal sinus pressure over the past several days. Generalized weakness. Occasional headaches. Nasal congestion. No fevers or chills. Denies upper teeth pain.\  Patient has had some intermittent right upper quadrant abdominal pains for the past month. She went to emergency room approximately 1 month ago and had reported lab work which was unremarkable and ultrasound which reportedly showed sludge but no gallstones and no gallbladder wall inflammation. Her symptoms are very inconsistent. No clear triggers. Denies any fevers or chills. No nausea or vomiting. No stool changes. No appetite or weight changes.  Patient requesting repeat hepatic panel  Past Medical History  Diagnosis Date  . Hypertension   . Lyme disease   . Fibromyalgia   . Anxiety   . Lyme disease    No past surgical history on file.  reports that she has never smoked. She does not have any smokeless tobacco history on file. She reports that she does not drink alcohol or use illicit drugs. family history includes Cancer (age of onset: 58) in her father; Heart disease in her mother; Hypertension in her mother, paternal grandfather, and paternal grandmother; Stroke in her paternal grandmother. Allergies  Allergen Reactions  . Penicillins     "childhood allergy"  . Sulfa Antibiotics     Caused white count to decrease      Review of Systems  Constitutional: Positive for fatigue. Negative for fever, chills, appetite change and unexpected weight change.  HENT: Positive for congestion and sinus pressure. Negative for trouble swallowing.   Respiratory: Negative for shortness of breath.   Cardiovascular: Negative for chest pain.  Gastrointestinal: Positive for abdominal pain. Negative for nausea, vomiting, blood in stool and abdominal  distention.  Genitourinary: Negative for dysuria.  Musculoskeletal: Negative for back pain.  Skin: Negative for rash.  Neurological: Positive for headaches.  Hematological: Negative for adenopathy.       Objective:   Physical Exam  Constitutional: She appears well-developed and well-nourished. No distress.  HENT:  Right Ear: External ear normal.  Left Ear: External ear normal.  Mouth/Throat: Oropharynx is clear and moist.  Eyes: Pupils are equal, round, and reactive to light.  Neck: Neck supple. No thyromegaly present.  Cardiovascular: Normal rate and regular rhythm.   Pulmonary/Chest: Effort normal and breath sounds normal. No respiratory distress. She has no wheezes. She has no rales.  Abdominal: Soft. Bowel sounds are normal. She exhibits no distension.  Minimally tender right upper quadrant to deep palpation  Lymphadenopathy:    She has no cervical adenopathy.  Skin: No rash noted.          Assessment & Plan:  #1 rhinitis symptoms. Differential is allergic versus viral. Doubt bacterial. We've recommended observation and treat symptomatically with Allegra. If symptoms persist consider doxycycline #2 right upper quadrant pain intermittently. Repeat hepatic panel. Recent ultrasound reportedly unremarkable. Consider gallbladder nuclear study if symptoms persist

## 2014-10-10 NOTE — Telephone Encounter (Signed)
Spoke with pt., she is going to schedule mamm.

## 2014-10-10 NOTE — Telephone Encounter (Signed)
Refill with 2 additional refills.  Try to avoid regular use.

## 2014-10-11 MED ORDER — ALPRAZOLAM 0.5 MG PO TABS: ORAL_TABLET | ORAL | Status: DC

## 2014-10-11 NOTE — Telephone Encounter (Signed)
Rx called in to pharmacy. 

## 2015-01-25 ENCOUNTER — Telehealth: Payer: Self-pay | Admitting: Family Medicine

## 2015-01-25 MED ORDER — ALPRAZOLAM 0.5 MG PO TABS: ORAL_TABLET | ORAL | Status: DC

## 2015-01-25 NOTE — Telephone Encounter (Signed)
Pt needs refill on alprazolam #30 w/refills call [facility] [place]. Pt is under mosher. Pt medicaid card has not been update

## 2015-02-22 ENCOUNTER — Other Ambulatory Visit: Payer: Self-pay | Admitting: Endocrinology

## 2015-02-22 DIAGNOSIS — E041 Nontoxic single thyroid nodule: Secondary | ICD-10-CM

## 2015-02-28 ENCOUNTER — Ambulatory Visit
Admission: RE | Admit: 2015-02-28 | Discharge: 2015-02-28 | Disposition: A | Discharge status: Home / Self Care | Payer: Medicare Other | Source: Ambulatory Visit | Attending: Endocrinology | Admitting: Endocrinology

## 2015-02-28 ENCOUNTER — Other Ambulatory Visit (HOSPITAL_COMMUNITY)
Admission: RE | Admit: 2015-02-28 | Discharge: 2015-02-28 | Disposition: A | Discharge status: Home / Self Care | Payer: Medicare Other | Source: Ambulatory Visit | Attending: Radiology | Admitting: Radiology

## 2015-02-28 DIAGNOSIS — E041 Nontoxic single thyroid nodule: Secondary | ICD-10-CM

## 2015-02-28 DIAGNOSIS — E042 Nontoxic multinodular goiter: Secondary | ICD-10-CM | POA: Insufficient documentation

## 2015-03-06 HISTORY — PX: OTHER SURGICAL HISTORY: SHX169

## 2015-03-06 LAB — HM DIABETES EYE EXAM

## 2015-03-08 ENCOUNTER — Other Ambulatory Visit: Payer: Self-pay | Admitting: Family Medicine

## 2015-03-08 ENCOUNTER — Telehealth: Payer: Self-pay | Admitting: Family Medicine

## 2015-03-08 NOTE — Telephone Encounter (Signed)
Made pt aware that she has a refill pending at the pharmacy. Pt is recently married so they had 2 different charts at pharmacy.

## 2015-03-08 NOTE — Telephone Encounter (Signed)
Pt need refill on alprazolam [facility] [address] [place] [facility]. Pt is aware md out of office until monday

## 2015-03-21 ENCOUNTER — Encounter (HOSPITAL_COMMUNITY): Payer: Self-pay

## 2015-03-31 ENCOUNTER — Emergency Department (HOSPITAL_COMMUNITY)
Admission: EM | Admit: 2015-03-31 | Discharge: 2015-04-01 | Disposition: A | Discharge status: Home / Self Care | Payer: Medicare Other | Attending: Emergency Medicine | Admitting: Emergency Medicine

## 2015-03-31 ENCOUNTER — Encounter (HOSPITAL_COMMUNITY): Payer: Self-pay | Admitting: Emergency Medicine

## 2015-03-31 DIAGNOSIS — Z8619 Personal history of other infectious and parasitic diseases: Secondary | ICD-10-CM | POA: Diagnosis not present

## 2015-03-31 DIAGNOSIS — R5383 Other fatigue: Secondary | ICD-10-CM

## 2015-03-31 DIAGNOSIS — Z880 Allergy status to penicillin: Secondary | ICD-10-CM | POA: Insufficient documentation

## 2015-03-31 DIAGNOSIS — R531 Weakness: Secondary | ICD-10-CM | POA: Diagnosis present

## 2015-03-31 DIAGNOSIS — I10 Essential (primary) hypertension: Secondary | ICD-10-CM | POA: Insufficient documentation

## 2015-03-31 DIAGNOSIS — E871 Hypo-osmolality and hyponatremia: Secondary | ICD-10-CM | POA: Insufficient documentation

## 2015-03-31 DIAGNOSIS — R197 Diarrhea, unspecified: Secondary | ICD-10-CM | POA: Diagnosis not present

## 2015-03-31 DIAGNOSIS — Z792 Long term (current) use of antibiotics: Secondary | ICD-10-CM | POA: Insufficient documentation

## 2015-03-31 DIAGNOSIS — F419 Anxiety disorder, unspecified: Secondary | ICD-10-CM | POA: Insufficient documentation

## 2015-03-31 DIAGNOSIS — Z79899 Other long term (current) drug therapy: Secondary | ICD-10-CM | POA: Diagnosis not present

## 2015-03-31 LAB — URINALYSIS, ROUTINE W REFLEX MICROSCOPIC
Bilirubin Urine: NEGATIVE
GLUCOSE, UA: NEGATIVE mg/dL
Hgb urine dipstick: NEGATIVE
Ketones, ur: NEGATIVE mg/dL
LEUKOCYTES UA: NEGATIVE
Nitrite: NEGATIVE
PH: 7.5 (ref 5.0–8.0)
Protein, ur: NEGATIVE mg/dL
Specific Gravity, Urine: 1.004 — ABNORMAL LOW (ref 1.005–1.030)

## 2015-03-31 LAB — COMPREHENSIVE METABOLIC PANEL
ALBUMIN: 4.3 g/dL (ref 3.5–5.0)
ALT: 15 U/L (ref 14–54)
ANION GAP: 9 (ref 5–15)
AST: 22 U/L (ref 15–41)
Alkaline Phosphatase: 60 U/L (ref 38–126)
BUN: 8 mg/dL (ref 6–20)
CHLORIDE: 91 mmol/L — AB (ref 101–111)
CO2: 27 mmol/L (ref 22–32)
CREATININE: 1.16 mg/dL — AB (ref 0.44–1.00)
Calcium: 9.6 mg/dL (ref 8.9–10.3)
GFR calc non Af Amer: 50 mL/min — ABNORMAL LOW (ref >60)
GFR, EST AFRICAN AMERICAN: 58 mL/min — AB (ref >60)
GLUCOSE: 130 mg/dL — AB (ref 65–99)
Potassium: 4.3 mmol/L (ref 3.5–5.1)
Sodium: 127 mmol/L — ABNORMAL LOW (ref 135–145)
Total Bilirubin: 1 mg/dL (ref 0.3–1.2)
Total Protein: 7.8 g/dL (ref 6.5–8.1)

## 2015-03-31 LAB — CBC
HCT: 37.9 % (ref 36.0–46.0)
HEMOGLOBIN: 12.9 g/dL (ref 12.0–15.0)
MCH: 32.4 pg (ref 26.0–34.0)
MCHC: 34 g/dL (ref 30.0–36.0)
MCV: 95.2 fL (ref 78.0–100.0)
Platelets: 281 10*3/uL (ref 150–400)
RBC: 3.98 MIL/uL (ref 3.87–5.11)
RDW: 12.2 % (ref 11.5–15.5)
WBC: 7.5 10*3/uL (ref 4.0–10.5)

## 2015-03-31 LAB — LIPASE, BLOOD: LIPASE: 25 U/L (ref 11–51)

## 2015-03-31 LAB — TROPONIN I: Troponin I: <0.03 ng/mL (ref <0.031)

## 2015-03-31 MED ORDER — SODIUM CHLORIDE 0.9 % IV BOLUS (SEPSIS)
1000.0000 mL | Freq: Once | INTRAVENOUS | Status: AC
  Administered 2015-03-31: 1000 mL via INTRAVENOUS

## 2015-03-31 MED ORDER — FAMOTIDINE IN NACL 20-0.9 MG/50ML-% IV SOLN
20.0000 mg | Freq: Once | INTRAVENOUS | Status: AC
  Administered 2015-03-31: 20 mg via INTRAVENOUS
  Filled 2015-03-31: qty 50

## 2015-03-31 MED ORDER — ONDANSETRON HCL 4 MG/2ML IJ SOLN
4.0000 mg | Freq: Once | INTRAMUSCULAR | Status: AC
  Administered 2015-03-31: 4 mg via INTRAVENOUS
  Filled 2015-03-31: qty 2

## 2015-03-31 NOTE — ED Provider Notes (Signed)
CSN: [id number]     Arrival date & time 03/31/15  1927 History   First MD Initiated Contact with Patient 03/31/15 2044     Chief Complaint  Patient presents with  . Weakness  . Chronic Lyme Disease   . Hypertension  . Diarrhea     (Consider location/radiation/quality/duration/timing/severity/associated sxs/prior Treatment) The history is provided by the patient and medical records.   59 year old female with history of hypertension, anxiety, Lyme disease, fibromyalgia, chronic fatigue syndrome, presenting to the ED for fatigue. Patient states she feels that during Thanksgiving she may overexert herself. She states since this time she has been feeling extremely fatigued with low energy, worse than her baseline. She states she had a spell earlier today where her blood pressure was elevated around 190/110 and her heart rate was in the 120s, states this resolved after taking her Xanax.  She states when this happens, her electrolytes are usually "out of whack" secondary to her chronic Lyme disease. She states she is seeing a specialist for this.  Patient also notes some increased heartburn, abdominal discomfort, and diarrhea.  She states she does have known gluten allergy and did not follow her diet over the holiday's either so thinks this may be contributing to her GI symptoms.  She denies chest pain or SOB.  She does note scratchy throat and feels very thirsty at this time.  No fever, chills, sweats.  Past Medical History  Diagnosis Date  . Hypertension   . Lyme disease   . Fibromyalgia   . Anxiety   . Lyme disease    Past Surgical History  Procedure Laterality Date  . Cataract removal with lens implant placement Left 03-06-2015   Family History  Problem Relation Age of Onset  . Heart disease Mother     CHF  . Hypertension Mother   . Cancer Father 44    lung cancer  . Hypertension Paternal Grandmother   . Stroke Paternal Grandmother   . Hypertension Paternal Grandfather    Social  History  Substance Use Topics  . Smoking status: Never Smoker   . Smokeless tobacco: None  . Alcohol Use: No     Comment: Ocaasional glass of wine   OB History    No data available     Review of Systems  Constitutional: Positive for fatigue.  Neurological: Positive for weakness.  All other systems reviewed and are negative.     Allergies  Penicillins and Sulfa antibiotics  Home Medications   Prior to Admission medications   Medication Sig Start Date End Date Taking? Authorizing Provider  Acetylcysteine (N-ACETYL-L-CYSTEINE) 600 MG CAPS Take by mouth. 1-2 as needed    Historical Provider, MD  Alpha-Lipoic Acid 300 MG CAPS Take by mouth. 1-2 per day    Historical Provider, MD  ALPRAZolam Duanne Moron) 0.5 MG tablet TAKE 1 TABLET BY MOUTH EVERY DAY AS NEEDED ANXIETY 01/25/15   Eulas Post, MD  CALCIUM CITRATE PO Take 1,000 mg by mouth. 5 per day    Historical Provider, MD  Cholecalciferol (VITAMIN D-3) 5000 UNITS TABS Take 5,000 tablets by mouth daily.    Historical Provider, MD  doxycycline (VIBRAMYCIN) 100 MG capsule Take 1 capsule (100 mg total) by mouth 2 (two) times daily. 10/10/14   Eulas Post, MD  MAGNESIUM CITRATE PO Take 400 mg by mouth daily.    Historical Provider, MD  Multiple Vitamin (MULTIVITAMIN) tablet Take 1 tablet by mouth daily.    Historical Provider, MD  NON FORMULARY Estradiol  1 mg cream;   Sig:  According to [facility] [employer], MD  OVER THE COUNTER MEDICATION Best Ubiquinol featuring [place] QH 100 mg/60 softgels- qd    Historical Provider, MD  OVER THE COUNTER MEDICATION Take 5 mLs by mouth daily. Argentym 23 Colloidial Silver    Historical Provider, MD  OVER THE COUNTER MEDICATION Take 3 capsules by mouth daily. Nattokinnase    Historical Provider, MD  OVER THE COUNTER MEDICATION Opimal electrolyte: Niacin 75mg , Magnesium 150mg , sodium 140mg , potassium 580 mg, D Ribose 2g, Taurine 250 mg    Historical Provider, MD   PRESCRIPTION MEDICATION Progesterone cream 20mg /ml  Sig:  According to instructions for [facility] Provider, MD  Saw Palmetto, Serenoa repens, (SAW PALMETTO PO) Take by mouth daily.    Historical Provider, MD   BP 155/96 mmHg  Pulse 99  Temp(Src) 97.8 F (36.6 C) (Oral)  Resp 18  SpO2 100%  LMP 03/15/2013   Physical Exam  Constitutional: She is oriented to person, place, and time. She appears well-developed and well-nourished.  HENT:  Head: Normocephalic and atraumatic.  Mouth/Throat: Oropharynx is clear and moist.  Mildly dry mucous membranes  Eyes: Conjunctivae and EOM are normal. Pupils are equal, round, and reactive to light.  Neck: Normal range of motion.  Cardiovascular: Normal rate, regular rhythm and normal heart sounds.   Pulmonary/Chest: Effort normal and breath sounds normal.  Abdominal: Soft. Bowel sounds are normal.  Musculoskeletal: Normal range of motion.  Neurological: She is alert and oriented to person, place, and time. She displays no tremor. She displays no seizure activity.  AAOx3, answering questions appropriately; equal strength UE and LE bilaterally; CN grossly intact; moves all extremities appropriately without ataxia; no focal neuro deficits or facial asymmetry appreciated  Skin: Skin is warm and dry.  Psychiatric: She has a normal mood and affect.  Nursing note and vitals reviewed.   ED Course  Procedures (including critical care time) Labs Review Labs Reviewed  COMPREHENSIVE METABOLIC PANEL - Abnormal; Notable for the following:    Sodium 127 (*)    Chloride 91 (*)    Glucose, Bld 130 (*)    Creatinine, Ser 1.16 (*)    GFR calc non Af Amer 50 (*)    GFR calc Af Amer 58 (*)    All other components within normal limits  URINALYSIS, ROUTINE W REFLEX MICROSCOPIC (NOT AT [facility]) - Abnormal; Notable for the following:    Specific Gravity, Urine 1.004 (*)    All other components within normal limits  LIPASE, BLOOD   CBC  TROPONIN I    Imaging Review No results found. I have personally reviewed and evaluated these images and lab results as part of my medical decision-making.   EKG Interpretation None      MDM   Final diagnoses:  Other fatigue  Diarrhea, unspecified type  Hyponatremia   59 year old female here with fatigue.  This is been ongoing for the past several years, multiple evaluations without known cause aside from Lyme disease. Thinks she may have overexerted herself during Thanksgiving making symptoms worse. Patient is afebrile, nontoxic.  She does appear mildly anxious. Has had some diarrhea which may be due to food intake over the holidays with her known gluten intolerance.  Abdominal exam is benign. Workup as above-- hyponatremia which appears baseline for patient when compared with her prior values.  No tremors or seizure activity here, neurologically intact. Slightly  elevated serum creatinine, possible component of dehydration given her diarrhea. Patient was fluid resuscitated with 2 L normal saline she was also given Zofran and Pepcid with improvement of her abdominal discomfort.  She is currently tolerating crackers and ginger ale without difficulty. Vital signs remained stable. Patient appears stable for discharge. I have instructed her to continue Zofran at home if needed as well as Imodium. Encouraged gentle diet, progress back to normal as tolerated. Encouraged lots of oral fluids. She will follow-up with her PCP next week.  Discussed plan with patient, he/she acknowledged understanding and agreed with plan of care.  Return precautions given for new or worsening symptoms.  Larene Pickett, PA-C 04/01/15 0017  Lacretia Leigh, MD 04/01/15 [phone]

## 2015-03-31 NOTE — ED Notes (Signed)
Pt reports hx Lyme disease and chronic fatigue syndrome. Thinks she over-exerted herself today and became extremely weak. Also says her  BP was increased to 190/110 at one point. She reports this happens when her electrolytes are decreased. Has not had to be admitted before but says they usually give her fluids and replace electrolytes and send her home. Feels dehydrated and "extremely thirsty." Took Xanax today. Pt is shaking in triage and says this also happens when her electrolytes are low. No other c/c.

## 2015-04-01 MED ORDER — ONDANSETRON 4 MG PO TBDP: 4.0000 mg | ORAL_TABLET | Freq: Three times a day (TID) | ORAL | Status: DC | PRN

## 2015-04-01 NOTE — Discharge Instructions (Signed)
Take the prescribed medication as directed if needed for nausea. May also take imodium for diarrhea.  Start with 1 tablet and progress to 2 if needed.  Drink plenty of fluids, eat regular meals. Follow-up with your primary care physician. Return to the ED for new or worsening symptoms.

## 2015-04-06 DIAGNOSIS — H2513 Age-related nuclear cataract, bilateral: Secondary | ICD-10-CM | POA: Diagnosis not present

## 2015-04-10 DIAGNOSIS — K580 Irritable bowel syndrome with diarrhea: Secondary | ICD-10-CM | POA: Diagnosis not present

## 2015-04-10 DIAGNOSIS — R5382 Chronic fatigue, unspecified: Secondary | ICD-10-CM | POA: Diagnosis not present

## 2015-04-10 DIAGNOSIS — E232 Diabetes insipidus: Secondary | ICD-10-CM | POA: Diagnosis not present

## 2015-04-10 DIAGNOSIS — E039 Hypothyroidism, unspecified: Secondary | ICD-10-CM | POA: Diagnosis not present

## 2015-04-10 DIAGNOSIS — E230 Hypopituitarism: Secondary | ICD-10-CM | POA: Diagnosis not present

## 2015-04-10 DIAGNOSIS — Z77120 Contact with and (suspected) exposure to mold (toxic): Secondary | ICD-10-CM | POA: Diagnosis not present

## 2015-04-10 DIAGNOSIS — D8989 Other specified disorders involving the immune mechanism, not elsewhere classified: Secondary | ICD-10-CM | POA: Diagnosis not present

## 2015-04-12 DIAGNOSIS — J0111 Acute recurrent frontal sinusitis: Secondary | ICD-10-CM | POA: Diagnosis not present

## 2015-04-12 DIAGNOSIS — J0101 Acute recurrent maxillary sinusitis: Secondary | ICD-10-CM | POA: Diagnosis not present

## 2015-04-23 ENCOUNTER — Telehealth: Payer: Self-pay | Admitting: Family Medicine

## 2015-04-23 NOTE — Telephone Encounter (Signed)
Left message to call back  

## 2015-04-23 NOTE — Telephone Encounter (Signed)
She can increase this (alprazolam) to 1 mg

## 2015-04-23 NOTE — Telephone Encounter (Signed)
Pt is aware that the max dose she can take is up to 2mg  of the Alprazolam. Our recommendations are to just increase to 1mg . Pt is aware and agreeable.

## 2015-04-23 NOTE — Telephone Encounter (Signed)
Please review and advise.

## 2015-04-23 NOTE — Telephone Encounter (Signed)
Patient is having cataract surgery Weds 04/25/15 at [facility] and her eye doctor wants to know if she can double her Alprazolam to 1.0mg  that morning to relax her because they use Versed and don't think she will tolerate that well.  Patient wants to know what the maximum amount of the Alprazolam she can use.

## 2015-04-25 DIAGNOSIS — Z882 Allergy status to sulfonamides status: Secondary | ICD-10-CM | POA: Diagnosis not present

## 2015-04-25 DIAGNOSIS — I10 Essential (primary) hypertension: Secondary | ICD-10-CM | POA: Diagnosis not present

## 2015-04-25 DIAGNOSIS — H2512 Age-related nuclear cataract, left eye: Secondary | ICD-10-CM | POA: Diagnosis not present

## 2015-04-25 DIAGNOSIS — R69 Illness, unspecified: Secondary | ICD-10-CM | POA: Diagnosis not present

## 2015-04-25 DIAGNOSIS — Z79899 Other long term (current) drug therapy: Secondary | ICD-10-CM | POA: Diagnosis not present

## 2015-04-25 DIAGNOSIS — Z888 Allergy status to other drugs, medicaments and biological substances status: Secondary | ICD-10-CM | POA: Diagnosis not present

## 2015-04-25 DIAGNOSIS — R0600 Dyspnea, unspecified: Secondary | ICD-10-CM | POA: Diagnosis not present

## 2015-04-26 ENCOUNTER — Telehealth: Payer: Self-pay | Admitting: Family Medicine

## 2015-04-26 NOTE — Telephone Encounter (Signed)
The 2 mg would only be a ONE TIME dose prior to her surgery- and that is maximum.  Would try 1 mg first. May refill by Dec 30.  I would like for her to try to avoid regular use of Alprazolam as much as possible.

## 2015-04-26 NOTE — Telephone Encounter (Signed)
Please advise if okay to refill. 

## 2015-04-26 NOTE — Telephone Encounter (Signed)
Pt was told specifically that the Max dose was 2mg  and only to take that amount if necessary. Please advise if okay to refill early.

## 2015-04-26 NOTE — Telephone Encounter (Signed)
Katie [name] called saying Dr. Elease Hashimoto would usually send a refill request of Alprazolam to her pharmacy on January 2nd, 2017 but she's wondering if the  Rx can be sent on December 30th or 31st instead. She's having cataract surgery on her other eye on January 30th and needs the extra number of pills for the surgery. When she had cataract surgery earlier this month Dr. Elease Hashimoto approved her taking 2mg  instead of .5, she's wondering if he'll approve that again for her upcoming surgery. Please call the pt if needed.  Pt's ph# [phone] Thank you.

## 2015-05-01 NOTE — Telephone Encounter (Signed)
Pt is aware to use only PRN. Will keep message active so I can send in refill. She will call back approx on the 20th of January to let us know if she will have enough for her 2nd cataract surgery on 1/20.

## 2015-05-01 NOTE — Telephone Encounter (Signed)
Left message for patient to call back  

## 2015-05-02 ENCOUNTER — Other Ambulatory Visit: Payer: Self-pay | Admitting: Family Medicine

## 2015-05-02 NOTE — Telephone Encounter (Signed)
Refill with 2 additional refills. 

## 2015-05-03 NOTE — Telephone Encounter (Signed)
Dr. Elease Hashimoto spoke with MD from [facility] in regards to pts elevated BP. Typically her BP is WNLS. She declined an appt at our office Friday 12/29. She is scheduled to see her GYN on 12/29 so will discuss with them first. I advised her to call us back to give [place] her blood pressure reading from their office. I recommended that she follow them closely and call us to schedule appt.

## 2015-05-04 DIAGNOSIS — K909 Intestinal malabsorption, unspecified: Secondary | ICD-10-CM | POA: Diagnosis not present

## 2015-05-09 DIAGNOSIS — R69 Illness, unspecified: Secondary | ICD-10-CM | POA: Diagnosis not present

## 2015-05-15 DIAGNOSIS — K589 Irritable bowel syndrome without diarrhea: Secondary | ICD-10-CM | POA: Diagnosis not present

## 2015-05-15 DIAGNOSIS — R5381 Other malaise: Secondary | ICD-10-CM | POA: Diagnosis not present

## 2015-05-25 DIAGNOSIS — H2511 Age-related nuclear cataract, right eye: Secondary | ICD-10-CM | POA: Diagnosis not present

## 2015-05-28 DIAGNOSIS — K9049 Malabsorption due to intolerance, not elsewhere classified: Secondary | ICD-10-CM | POA: Diagnosis not present

## 2015-06-04 DIAGNOSIS — Z882 Allergy status to sulfonamides status: Secondary | ICD-10-CM | POA: Diagnosis not present

## 2015-06-04 DIAGNOSIS — Z888 Allergy status to other drugs, medicaments and biological substances status: Secondary | ICD-10-CM | POA: Diagnosis not present

## 2015-06-04 DIAGNOSIS — R69 Illness, unspecified: Secondary | ICD-10-CM | POA: Diagnosis not present

## 2015-06-04 DIAGNOSIS — Z79899 Other long term (current) drug therapy: Secondary | ICD-10-CM | POA: Diagnosis not present

## 2015-06-04 DIAGNOSIS — M1990 Unspecified osteoarthritis, unspecified site: Secondary | ICD-10-CM | POA: Diagnosis not present

## 2015-06-04 DIAGNOSIS — H2511 Age-related nuclear cataract, right eye: Secondary | ICD-10-CM | POA: Diagnosis not present

## 2015-06-04 DIAGNOSIS — I10 Essential (primary) hypertension: Secondary | ICD-10-CM | POA: Diagnosis not present

## 2015-06-04 DIAGNOSIS — H52201 Unspecified astigmatism, right eye: Secondary | ICD-10-CM | POA: Diagnosis not present

## 2015-06-24 DIAGNOSIS — J069 Acute upper respiratory infection, unspecified: Secondary | ICD-10-CM | POA: Diagnosis not present

## 2015-07-04 DIAGNOSIS — J0101 Acute recurrent maxillary sinusitis: Secondary | ICD-10-CM | POA: Diagnosis not present

## 2015-07-04 DIAGNOSIS — J0111 Acute recurrent frontal sinusitis: Secondary | ICD-10-CM | POA: Diagnosis not present

## 2015-07-04 DIAGNOSIS — R05 Cough: Secondary | ICD-10-CM | POA: Diagnosis not present

## 2015-07-24 ENCOUNTER — Telehealth: Payer: Self-pay | Admitting: Family Medicine

## 2015-07-24 DIAGNOSIS — R109 Unspecified abdominal pain: Secondary | ICD-10-CM | POA: Diagnosis not present

## 2015-07-24 DIAGNOSIS — Z20828 Contact with and (suspected) exposure to other viral communicable diseases: Secondary | ICD-10-CM | POA: Diagnosis not present

## 2015-07-24 DIAGNOSIS — R110 Nausea: Secondary | ICD-10-CM | POA: Diagnosis not present

## 2015-07-24 NOTE — Telephone Encounter (Signed)
If she is concerned regarding a viral GI pathogen, there is no preventative medication.  Major prevention is good hand washing, avoid close respiratory contact, no sharing of glasses, etc.

## 2015-07-24 NOTE — Telephone Encounter (Signed)
Patient Name: TONDRA FILECCIA  DOB: 1956-02-06    Initial Comment Caller states was exposed to the stomach flu- has not gotten it yet. wanted a preventative *recently Married could be under [employer]*    Nurse Assessment      Guidelines    Guideline Title Affirmed Question Affirmed Notes       Final Disposition User   FINAL ATTEMPT MADE - no message left [place], RN, [employer]

## 2015-07-25 NOTE — Telephone Encounter (Signed)
I called the pt and informed her of the message below and she stated "never mind because her personal doctor called in Phenergan for her".

## 2015-08-07 DIAGNOSIS — M25562 Pain in left knee: Secondary | ICD-10-CM | POA: Diagnosis not present

## 2015-08-07 DIAGNOSIS — M7050 Other bursitis of knee, unspecified knee: Secondary | ICD-10-CM | POA: Diagnosis not present

## 2015-08-08 DIAGNOSIS — H43811 Vitreous degeneration, right eye: Secondary | ICD-10-CM | POA: Diagnosis not present

## 2015-08-08 DIAGNOSIS — D3132 Benign neoplasm of left choroid: Secondary | ICD-10-CM | POA: Diagnosis not present

## 2015-08-08 DIAGNOSIS — H43392 Other vitreous opacities, left eye: Secondary | ICD-10-CM | POA: Diagnosis not present

## 2015-08-27 DIAGNOSIS — K9049 Malabsorption due to intolerance, not elsewhere classified: Secondary | ICD-10-CM | POA: Diagnosis not present

## 2015-08-31 ENCOUNTER — Other Ambulatory Visit: Payer: Self-pay | Admitting: Family Medicine

## 2015-09-02 DIAGNOSIS — R5383 Other fatigue: Secondary | ICD-10-CM | POA: Diagnosis not present

## 2015-09-02 DIAGNOSIS — B349 Viral infection, unspecified: Secondary | ICD-10-CM | POA: Diagnosis not present

## 2015-09-02 DIAGNOSIS — J209 Acute bronchitis, unspecified: Secondary | ICD-10-CM | POA: Diagnosis not present

## 2015-09-06 DIAGNOSIS — D3132 Benign neoplasm of left choroid: Secondary | ICD-10-CM | POA: Diagnosis not present

## 2015-09-26 DIAGNOSIS — J0101 Acute recurrent maxillary sinusitis: Secondary | ICD-10-CM | POA: Diagnosis not present

## 2015-09-26 DIAGNOSIS — J0111 Acute recurrent frontal sinusitis: Secondary | ICD-10-CM | POA: Diagnosis not present

## 2015-10-16 DIAGNOSIS — E039 Hypothyroidism, unspecified: Secondary | ICD-10-CM | POA: Diagnosis not present

## 2015-10-16 DIAGNOSIS — D510 Vitamin B12 deficiency anemia due to intrinsic factor deficiency: Secondary | ICD-10-CM | POA: Diagnosis not present

## 2015-10-16 DIAGNOSIS — E28310 Symptomatic premature menopause: Secondary | ICD-10-CM | POA: Diagnosis not present

## 2015-10-16 DIAGNOSIS — R799 Abnormal finding of blood chemistry, unspecified: Secondary | ICD-10-CM | POA: Diagnosis not present

## 2015-10-16 DIAGNOSIS — E569 Vitamin deficiency, unspecified: Secondary | ICD-10-CM | POA: Diagnosis not present

## 2015-10-16 DIAGNOSIS — E559 Vitamin D deficiency, unspecified: Secondary | ICD-10-CM | POA: Diagnosis not present

## 2015-10-16 DIAGNOSIS — R5382 Chronic fatigue, unspecified: Secondary | ICD-10-CM | POA: Diagnosis not present

## 2015-10-17 DIAGNOSIS — R799 Abnormal finding of blood chemistry, unspecified: Secondary | ICD-10-CM | POA: Diagnosis not present

## 2015-10-17 DIAGNOSIS — J209 Acute bronchitis, unspecified: Secondary | ICD-10-CM | POA: Diagnosis not present

## 2015-10-17 DIAGNOSIS — E559 Vitamin D deficiency, unspecified: Secondary | ICD-10-CM | POA: Diagnosis not present

## 2015-10-17 DIAGNOSIS — R5382 Chronic fatigue, unspecified: Secondary | ICD-10-CM | POA: Diagnosis not present

## 2015-10-17 DIAGNOSIS — E039 Hypothyroidism, unspecified: Secondary | ICD-10-CM | POA: Diagnosis not present

## 2015-10-17 DIAGNOSIS — D510 Vitamin B12 deficiency anemia due to intrinsic factor deficiency: Secondary | ICD-10-CM | POA: Diagnosis not present

## 2015-10-17 DIAGNOSIS — E569 Vitamin deficiency, unspecified: Secondary | ICD-10-CM | POA: Diagnosis not present

## 2015-10-17 DIAGNOSIS — E28310 Symptomatic premature menopause: Secondary | ICD-10-CM | POA: Diagnosis not present

## 2015-10-23 DIAGNOSIS — E28310 Symptomatic premature menopause: Secondary | ICD-10-CM | POA: Diagnosis not present

## 2015-10-23 DIAGNOSIS — R799 Abnormal finding of blood chemistry, unspecified: Secondary | ICD-10-CM | POA: Diagnosis not present

## 2015-10-23 DIAGNOSIS — E569 Vitamin deficiency, unspecified: Secondary | ICD-10-CM | POA: Diagnosis not present

## 2015-10-23 DIAGNOSIS — E039 Hypothyroidism, unspecified: Secondary | ICD-10-CM | POA: Diagnosis not present

## 2015-10-23 DIAGNOSIS — R5382 Chronic fatigue, unspecified: Secondary | ICD-10-CM | POA: Diagnosis not present

## 2015-10-23 DIAGNOSIS — D510 Vitamin B12 deficiency anemia due to intrinsic factor deficiency: Secondary | ICD-10-CM | POA: Diagnosis not present

## 2015-10-23 DIAGNOSIS — E559 Vitamin D deficiency, unspecified: Secondary | ICD-10-CM | POA: Diagnosis not present

## 2015-10-24 DIAGNOSIS — D3132 Benign neoplasm of left choroid: Secondary | ICD-10-CM | POA: Diagnosis not present

## 2015-10-31 ENCOUNTER — Ambulatory Visit (INDEPENDENT_AMBULATORY_CARE_PROVIDER_SITE_OTHER): Payer: Medicare HMO | Admitting: Family Medicine

## 2015-10-31 VITALS — BP 102/80 | HR 101 | Temp 97.4°F

## 2015-10-31 DIAGNOSIS — J0100 Acute maxillary sinusitis, unspecified: Secondary | ICD-10-CM | POA: Diagnosis not present

## 2015-10-31 DIAGNOSIS — J3489 Other specified disorders of nose and nasal sinuses: Secondary | ICD-10-CM | POA: Diagnosis not present

## 2015-10-31 MED ORDER — AMOXICILLIN 500 MG PO CAPS: ORAL_CAPSULE | ORAL | Status: DC

## 2015-10-31 NOTE — Progress Notes (Signed)
Pre visit review using our clinic review tool, if applicable. No additional management support is needed unless otherwise documented below in the visit note. 

## 2015-10-31 NOTE — Progress Notes (Signed)
   Subjective:    Patient ID: Katie [name], female    DOB: 12/31/1955, 60 y.o.   MRN: [id number]  HPI Acute visit for possible sinus infection. Patient seen with 2 and one half week history of fatigue and cough. She went to urgent care and given doxycycline over two weeks ago. Cough is essentially resolved. Now she has bilateral maxillary and frontal sinus pressure and pain with increased fatigue. No bloody nasal discharge. Occasional upper teeth pain. Had history of frequent sinusitis the past. Allergy to penicillin and sulfa.  Past Medical History  Diagnosis Date  . Hypertension   . Lyme disease   . Fibromyalgia   . Anxiety   . Lyme disease    Past Surgical History  Procedure Laterality Date  . Cataract removal with lens implant placement Left 03-06-2015    reports that she has never smoked. She does not have any smokeless tobacco history on file. She reports that she does not drink alcohol or use illicit drugs. family history includes Cancer (age of onset: 31) in her father; Heart disease in her mother; Hypertension in her mother, paternal grandfather, and paternal grandmother; Stroke in her paternal grandmother. Allergies  Allergen Reactions  . Prednisone Other (See Comments)    Was told not to use because of Lymes disease. States she also cannot use steroid drops.   . Sulfa Antibiotics     Caused white count to decrease      Review of Systems  Constitutional: Positive for fatigue. Negative for fever and chills.  HENT: Positive for congestion and sinus pressure.   Respiratory: Negative for cough.   Neurological: Positive for headaches.       Objective:   Physical Exam  Constitutional: She appears well-developed and well-nourished.  HENT:  Right Ear: External ear normal.  Left Ear: External ear normal.  Mouth/Throat: Oropharynx is clear and moist.  Erythematous nasal mucosa otherwise clear  Neck: Neck supple.  Cardiovascular: Normal rate and regular rhythm.     Pulmonary/Chest: Effort normal and breath sounds normal. No respiratory distress. She has no wheezes. She has no rales.  Lymphadenopathy:    She has no cervical adenopathy.          Assessment & Plan:  Probable acute frontal and maxillary sinusitis. Patient had initially reported penicillin allergy but she states she has actually taken amoxicillin multiple times in her life without any difficulty whatsoever. Amoxicillin 500 milligrams 2 tablets twice daily. Continue saline nasal irrigation. Stay well-hydrated. Follow-up as needed  Eulas Post MD [facility]

## 2015-10-31 NOTE — Patient Instructions (Signed)

## 2015-11-04 ENCOUNTER — Emergency Department (HOSPITAL_COMMUNITY)
Admission: EM | Admit: 2015-11-04 | Discharge: 2015-11-05 | Disposition: A | Discharge status: Home / Self Care | Payer: Medicare HMO | Attending: Emergency Medicine | Admitting: Emergency Medicine

## 2015-11-04 ENCOUNTER — Encounter (HOSPITAL_COMMUNITY): Payer: Self-pay | Admitting: *Deleted

## 2015-11-04 DIAGNOSIS — E871 Hypo-osmolality and hyponatremia: Secondary | ICD-10-CM | POA: Diagnosis not present

## 2015-11-04 DIAGNOSIS — E860 Dehydration: Secondary | ICD-10-CM

## 2015-11-04 DIAGNOSIS — R05 Cough: Secondary | ICD-10-CM | POA: Diagnosis present

## 2015-11-04 DIAGNOSIS — R197 Diarrhea, unspecified: Secondary | ICD-10-CM | POA: Insufficient documentation

## 2015-11-04 DIAGNOSIS — Z79899 Other long term (current) drug therapy: Secondary | ICD-10-CM | POA: Insufficient documentation

## 2015-11-04 DIAGNOSIS — Z79818 Long term (current) use of other agents affecting estrogen receptors and estrogen levels: Secondary | ICD-10-CM | POA: Diagnosis not present

## 2015-11-04 DIAGNOSIS — I10 Essential (primary) hypertension: Secondary | ICD-10-CM | POA: Diagnosis not present

## 2015-11-04 DIAGNOSIS — Z792 Long term (current) use of antibiotics: Secondary | ICD-10-CM | POA: Diagnosis not present

## 2015-11-04 LAB — CBC WITH DIFFERENTIAL/PLATELET
BASOS ABS: 0 10*3/uL (ref 0.0–0.1)
BASOS PCT: 0 %
EOS ABS: 0.2 10*3/uL (ref 0.0–0.7)
EOS PCT: 2 %
HCT: 33.1 % — ABNORMAL LOW (ref 36.0–46.0)
HEMOGLOBIN: 11.8 g/dL — AB (ref 12.0–15.0)
LYMPHS ABS: 1.4 10*3/uL (ref 0.7–4.0)
Lymphocytes Relative: 17 %
MCH: 32.5 pg (ref 26.0–34.0)
MCHC: 35.6 g/dL (ref 30.0–36.0)
MCV: 91.2 fL (ref 78.0–100.0)
Monocytes Absolute: 0.8 10*3/uL (ref 0.1–1.0)
Monocytes Relative: 10 %
NEUTROS PCT: 71 %
Neutro Abs: 6 10*3/uL (ref 1.7–7.7)
PLATELETS: 242 10*3/uL (ref 150–400)
RBC: 3.63 MIL/uL — AB (ref 3.87–5.11)
RDW: 12 % (ref 11.5–15.5)
WBC: 8.4 10*3/uL (ref 4.0–10.5)

## 2015-11-04 MED ORDER — FAMOTIDINE IN NACL 20-0.9 MG/50ML-% IV SOLN
20.0000 mg | Freq: Once | INTRAVENOUS | Status: AC
  Administered 2015-11-04: 20 mg via INTRAVENOUS
  Filled 2015-11-04: qty 50

## 2015-11-04 MED ORDER — SODIUM CHLORIDE 0.9 % IV BOLUS (SEPSIS)
1000.0000 mL | Freq: Once | INTRAVENOUS | Status: AC
  Administered 2015-11-04: 1000 mL via INTRAVENOUS

## 2015-11-04 MED ORDER — ONDANSETRON HCL 4 MG/2ML IJ SOLN
4.0000 mg | Freq: Once | INTRAMUSCULAR | Status: AC
  Administered 2015-11-04: 4 mg via INTRAVENOUS
  Filled 2015-11-04: qty 2

## 2015-11-04 MED ORDER — SODIUM CHLORIDE 0.9 % IV SOLN
INTRAVENOUS | Status: DC
  Administered 2015-11-05: via INTRAVENOUS

## 2015-11-04 NOTE — ED Provider Notes (Signed)
CSN: [id number]     Arrival date & time 11/04/15  2157 History   First MD Initiated Contact with Patient 11/04/15 2211     Chief Complaint  Patient presents with  . Diarrhea  . Hypertension   HPI Pt has been having trouble with cough, congestion and sinus infection.  She saw her PCP and was put on amoxicillin.  It was messing up her stomach so she stopped the abx.  She tried taking apple cider vinegar today but the symptoms got worse.  She had had nausea and poor appetite intermittently. NO vomiting.  No abdominal pain.  She has had a couple of loose stools per day.  She started to feel weak and dehydrated.  She has had problems with her electrolytes in the past.    Past Medical History  Diagnosis Date  . Hypertension   . Lyme disease   . Fibromyalgia   . Anxiety   . Lyme disease    Past Surgical History  Procedure Laterality Date  . Cataract removal with lens implant placement Left 03-06-2015   Family History  Problem Relation Age of Onset  . Heart disease Mother     CHF  . Hypertension Mother   . Cancer Father 67    lung cancer  . Hypertension Paternal Grandmother   . Stroke Paternal Grandmother   . Hypertension Paternal Grandfather    Social History  Substance Use Topics  . Smoking status: Never Smoker   . Smokeless tobacco: None  . Alcohol Use: No     Comment: Ocaasional glass of wine   OB History    No data available     Review of Systems  All other systems reviewed and are negative.     Allergies  Prednisone and Sulfa antibiotics  Home Medications   Prior to Admission medications   Medication Sig Start Date End Date Taking? Authorizing Provider  ALPRAZolam (XANAX) 0.5 MG tablet TAKE 1 TABLET BY MOUTH DAILY AS NEEDED FOR ANXIETY Patient taking differently: TAKE 1-2 TABLET BY MOUTH DAILY AS NEEDED FOR ANXIETY 08/31/15  Yes Eulas Post, MD  amoxicillin (AMOXIL) 500 MG capsule Take 2 capsules po bid for 15 days Patient taking differently: Take 1,000  mg by mouth 2 (two) times daily. Take 2 capsules po bid for 15 days 10/31/15  Yes Eulas Post, MD  b complex vitamins tablet Take 1 tablet by mouth daily. B-SUPREME   Yes Historical Provider, MD  Cholecalciferol (VITAMIN D-3) 5000 UNITS TABS Take 5,000 tablets by mouth daily.   Yes Historical Provider, MD  Coenzyme Q10 (CO Q10 PO) Take 400 mg by mouth daily.   Yes Historical Provider, MD  CYANOCOBALAMIN-METHYLCOBALAMIN SL Inject 1 mL as directed every 7 (seven) days.    Yes Historical Provider, MD  folic acid (FOLVITE) 1 MG tablet Take 1,000 mcg by mouth daily. FOLATE 1000 MCG   Yes Historical Provider, MD  LEVOMEFOLIC ACID PO Take 1 capsule by mouth daily. L-5 MTHF   Yes Historical Provider, MD  liothyronine (CYTOMEL) 5 MCG tablet Take 5 mcg by mouth 2 (two) times daily. 10/30/15  Yes Historical Provider, MD  Misc. Devices (TOPICAL DISPENSER) MISC Apply 1 each topically daily. BI-EST 5 CREAM.  4 CLICKS DAILY (Biest (biestrogen) containing 20% estradiol and 80% estriol)   Yes Historical Provider, MD  NALTREXONE HCL PO Take 1.5 mg by mouth daily.   Yes Historical Provider, MD  Omega-3 Fatty Acids (FISH OIL PO) Take 2 capsules by mouth 2 (  two) times daily. BARLEANS FISH OIL   Yes Historical Provider, MD  OVER THE COUNTER MEDICATION Take 2 capsules by mouth daily. S-Acetyl Glutathione (SYNERGY)   Yes Historical Provider, MD  OVER THE COUNTER MEDICATION Take 325 mg by mouth 2 (two) times daily. NATURAL CALM (magnesium carbonate and citric acid)-IONIC MAGNESIUM CITRATE   Yes Historical Provider, MD  PROAIR HFA 108 (90 Base) MCG/ACT inhaler Inhale 2 puffs into the lungs every 4 (four) hours as needed. For shortness of breath 10/11/15  Yes Historical Provider, MD  Probiotic Product (PROBIOTIC ADVANCED PO) Take 1 capsule by mouth daily. 100 BILLION   Yes Historical Provider, MD  progesterone (PROMETRIUM) 200 MG capsule Take 200 mg by mouth at bedtime. 10/16/15  Yes Historical Provider, MD  Progesterone  Micronized (PROGESTERONE PO) Take 50 mg by mouth.   Yes Historical Provider, MD  Testosterone 10 MG/ACT (2%) GEL Place 0.25 mLs onto the skin daily.   Yes Historical Provider, MD  omeprazole (PRILOSEC) 20 MG capsule Take 1 capsule (20 mg total) by mouth daily. 11/05/15   Dorie Rank, MD  ondansetron (ZOFRAN ODT) 8 MG disintegrating tablet Take 1 tablet (8 mg total) by mouth every 8 (eight) hours as needed for nausea or vomiting. 11/05/15   Dorie Rank, MD   BP 122/81 mmHg  Pulse 72  Temp(Src) 97.8 F (36.6 C) (Oral)  Resp 14  Ht 5\' 7"  (1.702 m)  Wt 60.782 kg  BMI 20.98 kg/m2  SpO2 98%  LMP 03/15/2013 Physical Exam  Constitutional: She appears well-developed and well-nourished. No distress.  HENT:  Head: Normocephalic and atraumatic.  Right Ear: External ear normal.  Left Ear: External ear normal.  Eyes: Conjunctivae are normal. Right eye exhibits no discharge. Left eye exhibits no discharge. No scleral icterus.  Neck: Neck supple. No tracheal deviation present.  Cardiovascular: Normal rate, regular rhythm and intact distal pulses.   Pulmonary/Chest: Effort normal and breath sounds normal. No stridor. No respiratory distress. She has no wheezes. She has no rales.  Abdominal: Soft. Bowel sounds are normal. She exhibits no distension. There is no tenderness. There is no rebound and no guarding.  Musculoskeletal: She exhibits no edema or tenderness.  Neurological: She is alert. She has normal strength. No cranial nerve deficit (no facial droop, extraocular movements intact, no slurred speech) or sensory deficit. She exhibits normal muscle tone. She displays no seizure activity. Coordination normal.  Skin: Skin is warm and dry. No rash noted.  Psychiatric: She has a normal mood and affect.  Nursing note and vitals reviewed.   ED Course  Procedures (including critical care time) Labs Review Labs Reviewed  COMPREHENSIVE METABOLIC PANEL - Abnormal; Notable for the following:    Sodium 121 (*)     Chloride 86 (*)    Glucose, Bld 149 (*)    AST 59 (*)    All other components within normal limits  CBC WITH DIFFERENTIAL/PLATELET - Abnormal; Notable for the following:    RBC 3.63 (*)    Hemoglobin 11.8 (*)    HCT 33.1 (*)    All other components within normal limits  URINALYSIS, ROUTINE W REFLEX MICROSCOPIC (NOT AT [facility]) - Abnormal; Notable for the following:    Glucose, UA 100 (*)    Hgb urine dipstick SMALL (*)    Ketones, ur 15 (*)    Leukocytes, UA MODERATE (*)    All other components within normal limits  URINE MICROSCOPIC-ADD ON - Abnormal; Notable for the following:    Squamous  Epithelial / LPF 0-5 (*)    Bacteria, UA RARE (*)    All other components within normal limits  URINE CULTURE  LIPASE, BLOOD    . Medications given in the ED Medications  sodium chloride 0.9 % bolus 1,000 mL (0 mLs Intravenous Stopped 11/05/15 0001)    And  0.9 %  sodium chloride infusion ( Intravenous Stopped 11/05/15 0117)  ondansetron (ZOFRAN) injection 4 mg (4 mg Intravenous Given 11/04/15 2308)  famotidine (PEPCID) IVPB 20 mg premix (0 mg Intravenous Stopped 11/05/15 0006)      MDM   Final diagnoses:  Hyponatremia  Dehydration    Pt's labs do show hyponatremia.  Could be related to her diarrhea and poor po intake.  Sx improved with iv saline.  Will dc home on antacids, and increase salt intake.    UA is abnormal however pt denies urinary sx.  Will send off urine culture.  Follow up with pcp    Dorie Rank, MD 11/05/15 0130

## 2015-11-04 NOTE — ED Notes (Signed)
Pt c/o diarrhea for several days, x 3 in last 24 hours. Pt has been on antibiotics for several weeks, stopped taking Amoxicillin d/t stomach irritation. Pt states today she began detoxing using apple cider vinegar and water, very little food intake. Pt appears very anxious. Pt denies pain, only upset stomach.

## 2015-11-04 NOTE — ED Notes (Signed)
Per pt report: pt recently dx with bronchitis and had been taking amoxicillin for the past week.  Pt has been having abd pain on and off for past 3 days.  Pt reports tonight it has gotten to be severe.  Pt has been having diarrhea for the past few days.  Pt reports having 2 episodes of diarrhea today.  Pt reports feeling faint in triage and states, "I think my electrolytes are off." Pt a/o x 4.  Skin warm and dry.

## 2015-11-05 ENCOUNTER — Telehealth (HOSPITAL_BASED_OUTPATIENT_CLINIC_OR_DEPARTMENT_OTHER): Payer: Self-pay | Admitting: Emergency Medicine

## 2015-11-05 DIAGNOSIS — R197 Diarrhea, unspecified: Secondary | ICD-10-CM | POA: Diagnosis not present

## 2015-11-05 DIAGNOSIS — R1032 Left lower quadrant pain: Secondary | ICD-10-CM | POA: Diagnosis not present

## 2015-11-05 DIAGNOSIS — B9789 Other viral agents as the cause of diseases classified elsewhere: Secondary | ICD-10-CM | POA: Diagnosis not present

## 2015-11-05 DIAGNOSIS — Z792 Long term (current) use of antibiotics: Secondary | ICD-10-CM | POA: Diagnosis not present

## 2015-11-05 DIAGNOSIS — Z79899 Other long term (current) drug therapy: Secondary | ICD-10-CM | POA: Diagnosis not present

## 2015-11-05 DIAGNOSIS — E871 Hypo-osmolality and hyponatremia: Secondary | ICD-10-CM | POA: Diagnosis not present

## 2015-11-05 DIAGNOSIS — Z79818 Long term (current) use of other agents affecting estrogen receptors and estrogen levels: Secondary | ICD-10-CM | POA: Diagnosis not present

## 2015-11-05 DIAGNOSIS — E860 Dehydration: Secondary | ICD-10-CM | POA: Diagnosis not present

## 2015-11-05 DIAGNOSIS — I10 Essential (primary) hypertension: Secondary | ICD-10-CM | POA: Diagnosis not present

## 2015-11-05 DIAGNOSIS — J069 Acute upper respiratory infection, unspecified: Secondary | ICD-10-CM | POA: Diagnosis not present

## 2015-11-05 LAB — COMPREHENSIVE METABOLIC PANEL
ALBUMIN: 4 g/dL (ref 3.5–5.0)
ALK PHOS: 54 U/L (ref 38–126)
ALT: 28 U/L (ref 14–54)
ANION GAP: 13 (ref 5–15)
AST: 59 U/L — ABNORMAL HIGH (ref 15–41)
BUN: 7 mg/dL (ref 6–20)
CHLORIDE: 86 mmol/L — AB (ref 101–111)
CO2: 22 mmol/L (ref 22–32)
Calcium: 9 mg/dL (ref 8.9–10.3)
Creatinine, Ser: 0.57 mg/dL (ref 0.44–1.00)
GFR calc Af Amer: >60 mL/min (ref >60)
GFR calc non Af Amer: >60 mL/min (ref >60)
GLUCOSE: 149 mg/dL — AB (ref 65–99)
POTASSIUM: 4.4 mmol/L (ref 3.5–5.1)
SODIUM: 121 mmol/L — AB (ref 135–145)
Total Bilirubin: 1.1 mg/dL (ref 0.3–1.2)
Total Protein: 7.4 g/dL (ref 6.5–8.1)

## 2015-11-05 LAB — URINE MICROSCOPIC-ADD ON

## 2015-11-05 LAB — URINALYSIS, ROUTINE W REFLEX MICROSCOPIC
BILIRUBIN URINE: NEGATIVE
Glucose, UA: 100 mg/dL — AB
Ketones, ur: 15 mg/dL — AB
NITRITE: NEGATIVE
PH: 7 (ref 5.0–8.0)
Protein, ur: NEGATIVE mg/dL
SPECIFIC GRAVITY, URINE: 1.005 (ref 1.005–1.030)

## 2015-11-05 LAB — LIPASE, BLOOD: Lipase: 21 U/L (ref 11–51)

## 2015-11-05 MED ORDER — ONDANSETRON 8 MG PO TBDP: 8.0000 mg | ORAL_TABLET | Freq: Three times a day (TID) | ORAL | Status: DC | PRN

## 2015-11-05 MED ORDER — OMEPRAZOLE 20 MG PO CPDR: 20.0000 mg | DELAYED_RELEASE_CAPSULE | Freq: Every day | ORAL | Status: DC

## 2015-11-05 NOTE — ED Notes (Signed)
Pt denies pain at this time, pt states she feel better in general, reports no further diarrhea since arriving

## 2015-11-05 NOTE — Discharge Instructions (Signed)
Dehydration, Adult Dehydration is a condition in which you do not have enough fluid or water in your body. It happens when you take in less fluid than you lose. Vital organs such as the kidneys, brain, and heart cannot function without a proper amount of fluids. Any loss of fluids from the body can cause dehydration.  Dehydration can range from mild to severe. This condition should be treated right away to help prevent it from becoming severe. CAUSES  This condition may be caused by:  Vomiting.  Diarrhea.  Excessive sweating, such as when exercising in hot or humid weather.  Not drinking enough fluid during strenuous exercise or during an illness.  Excessive urine output.  Fever.  Certain medicines. RISK FACTORS This condition is more likely to develop in:  People who are taking certain medicines that cause the body to lose excess fluid (diuretics).   People who have a chronic illness, such as diabetes, that may increase urination.  Older adults.   People who live at high altitudes.   People who participate in endurance sports.  SYMPTOMS  Mild Dehydration  Thirst.  Dry lips.  Slightly dry mouth.  Dry, warm skin. Moderate Dehydration  Very dry mouth.   Muscle cramps.   Dark urine and decreased urine production.   Decreased tear production.   Headache.   Light-headedness, especially when you stand up from a sitting position.  Severe Dehydration  Changes in skin.   Cold and clammy skin.   Skin does not spring back quickly when lightly pinched and released.   Changes in body fluids.   Extreme thirst.   No tears.   Not able to sweat when body temperature is high, such as in hot weather.   Minimal urine production.   Changes in vital signs.   Rapid, weak pulse (more than 100 beats per minute when you are sitting still).   Rapid breathing.   Low blood pressure.   Other changes.   Sunken eyes.   Cold hands and feet.    Confusion.  Lethargy and difficulty being awakened.  Fainting (syncope).   Short-term weight loss.   Unconsciousness. DIAGNOSIS  This condition may be diagnosed based on your symptoms. You may also have tests to determine how severe your dehydration is. These tests may include:   Urine tests.   Blood tests.  TREATMENT  Treatment for this condition depends on the severity. Mild or moderate dehydration can often be treated at home. Treatment should be started right away. Do not wait until dehydration becomes severe. Severe dehydration needs to be treated at the hospital. Treatment for Mild Dehydration  Drinking plenty of water to replace the fluid you have lost.   Replacing minerals in your blood (electrolytes) that you may have lost.  Treatment for Moderate Dehydration  Consuming oral rehydration solution (ORS). Treatment for Severe Dehydration  Receiving fluid through an IV tube.   Receiving electrolyte solution through a feeding tube that is passed through your nose and into your stomach (nasogastric tube or NG tube).  Correcting any abnormalities in electrolytes. HOME CARE INSTRUCTIONS   Drink enough fluid to keep your urine clear or pale yellow.   Drink water or fluid slowly by taking small sips. You can also try sucking on ice cubes.  Have food or beverages that contain electrolytes. Examples include bananas and sports drinks.  Take over-the-counter and prescription medicines only as told by your health care provider.   Prepare ORS according to the manufacturer's instructions. Take sips  of ORS every 5 minutes until your urine returns to normal.  If you have vomiting or diarrhea, continue to try to drink water, ORS, or both.   If you have diarrhea, avoid:   Beverages that contain caffeine.   Fruit juice.   Milk.   Carbonated soft drinks.  Do not take salt tablets. This can lead to the condition of having too much sodium in your body  (hypernatremia).  SEEK MEDICAL CARE IF:  You cannot eat or drink without vomiting.  You have had moderate diarrhea during a period of more than 24 hours.  You have a fever. SEEK IMMEDIATE MEDICAL CARE IF:   You have extreme thirst.  You have severe diarrhea.  You have not urinated in 6-8 hours, or you have urinated only a small amount of very dark urine.  You have shriveled skin.  You are dizzy, confused, or both.   This information is not intended to replace advice given to you by your health care provider. Make sure you discuss any questions you have with your health care provider.   Document Released: 04/21/2005 Document Revised: 01/10/2015 Document Reviewed: 09/06/2014 Elsevier Interactive Patient Education 2016 [employer].  Hyponatremia Hyponatremia is when the amount of salt (sodium) in your blood is too low. When sodium levels are low, your cells absorb extra water and they swell. The swelling happens throughout the body, but it mostly affects the brain. CAUSES This condition may be caused by:  Heart, kidney, or liver problems.  Thyroid problems.  Adrenal gland problems.  Metabolic conditions, such as syndrome of inappropriate antidiuretic hormone (SIADH).  Severe vomiting and diarrhea.  Certain medicines or illegal drugs.  Dehydration.  Drinking too much water.  Eating a diet that is low in sodium.  Large burns on your body.  Sweating. RISK FACTORS This condition is more likely to develop in people who:  Have long-term (chronic) kidney disease.  Have heart failure.  Have a medical condition that causes frequent or excessive diarrhea.  Have metabolic conditions, such as Addison disease or SIADH.  Take certain medicines that affect the sodium and fluid balance in the blood. Some of these medicine types include:  Diuretics.  NSAIDs.  Some opioid pain medicines.  Some antidepressants.  Some seizure prevention medicines. SYMPTOMS    Symptoms of this condition include:  Nausea and vomiting.  Confusion.  Lethargy.  Agitation.  Headache.  Seizures.  Unconsciousness.  Appetite loss.  Muscle weakness and cramping.  Feeling weak or light-headed.  Having a rapid heart rate.  Fainting, in severe cases. DIAGNOSIS This condition is diagnosed with a medical history and physical exam. You will also have other tests, including:  Blood tests.  Urine tests. TREATMENT Treatment for this condition depends on the cause. Treatment may include:  Fluids given through an IV tube that is inserted into one of your veins.  Medicines to correct the sodium imbalance. If medicines are causing the condition, the medicines will need to be adjusted.  Limiting water or fluid intake to get the correct sodium balance. HOME CARE INSTRUCTIONS  Take medicines only as directed by your health care provider. Many medicines can make this condition worse. Talk with your health care provider about any medicines that you are currently taking.  Carefully follow a recommended diet as directed by your health care provider.  Carefully follow instructions from your health care provider about fluid restrictions.  Keep all follow-up visits as directed by your health care provider. This is important.  Do  not drink alcohol. SEEK MEDICAL CARE IF:  You develop worsening nausea, fatigue, headache, confusion, or weakness.  Your symptoms go away and then return.  You have problems following the recommended diet. SEEK IMMEDIATE MEDICAL CARE IF:  You have a seizure.  You faint.  You have ongoing diarrhea or vomiting.   This information is not intended to replace advice given to you by your health care provider. Make sure you discuss any questions you have with your health care provider.   Document Released: 04/11/2002 Document Revised: 09/05/2014 Document Reviewed: 05/11/2014 Elsevier Interactive Patient Education [employer].

## 2015-11-06 LAB — URINE CULTURE: Culture: 8000 — AB

## 2015-11-08 ENCOUNTER — Telehealth: Payer: Self-pay | Admitting: Family Medicine

## 2015-11-08 DIAGNOSIS — I10 Essential (primary) hypertension: Secondary | ICD-10-CM | POA: Diagnosis not present

## 2015-11-08 DIAGNOSIS — J0100 Acute maxillary sinusitis, unspecified: Secondary | ICD-10-CM | POA: Diagnosis not present

## 2015-11-08 NOTE — Telephone Encounter (Signed)
Pt lost her alprazolam med and needs another rx call into [facility] at [address] [place]. Pt needs med by Tuesday 11-13-15

## 2015-11-08 NOTE — Telephone Encounter (Signed)
Last refill 08/31/2015 #30, 2rf Should not be due until 11/30/15 Last filled at the pharmacy was 10/31/2015 #30 Please advise

## 2015-11-13 ENCOUNTER — Observation Stay (HOSPITAL_COMMUNITY)
Admission: EM | Admit: 2015-11-13 | Discharge: 2015-11-14 | Disposition: A | Discharge status: Home / Self Care | Payer: Medicare HMO | Attending: Internal Medicine | Admitting: Internal Medicine

## 2015-11-13 ENCOUNTER — Encounter (HOSPITAL_COMMUNITY): Payer: Self-pay | Admitting: *Deleted

## 2015-11-13 DIAGNOSIS — K529 Noninfective gastroenteritis and colitis, unspecified: Secondary | ICD-10-CM | POA: Diagnosis present

## 2015-11-13 DIAGNOSIS — E871 Hypo-osmolality and hyponatremia: Secondary | ICD-10-CM | POA: Diagnosis not present

## 2015-11-13 DIAGNOSIS — R197 Diarrhea, unspecified: Secondary | ICD-10-CM | POA: Diagnosis not present

## 2015-11-13 DIAGNOSIS — Z79899 Other long term (current) drug therapy: Secondary | ICD-10-CM | POA: Insufficient documentation

## 2015-11-13 DIAGNOSIS — E860 Dehydration: Secondary | ICD-10-CM | POA: Diagnosis not present

## 2015-11-13 DIAGNOSIS — R531 Weakness: Secondary | ICD-10-CM | POA: Diagnosis present

## 2015-11-13 DIAGNOSIS — I10 Essential (primary) hypertension: Secondary | ICD-10-CM | POA: Insufficient documentation

## 2015-11-13 DIAGNOSIS — F419 Anxiety disorder, unspecified: Secondary | ICD-10-CM | POA: Diagnosis present

## 2015-11-13 DIAGNOSIS — F329 Major depressive disorder, single episode, unspecified: Secondary | ICD-10-CM | POA: Diagnosis present

## 2015-11-13 LAB — COMPREHENSIVE METABOLIC PANEL
ALK PHOS: 56 U/L (ref 38–126)
ALT: 16 U/L (ref 14–54)
ANION GAP: 6 (ref 5–15)
AST: 20 U/L (ref 15–41)
Albumin: 4.1 g/dL (ref 3.5–5.0)
BUN: 6 mg/dL (ref 6–20)
CALCIUM: 9.2 mg/dL (ref 8.9–10.3)
CO2: 27 mmol/L (ref 22–32)
CREATININE: 0.61 mg/dL (ref 0.44–1.00)
Chloride: 91 mmol/L — ABNORMAL LOW (ref 101–111)
Glucose, Bld: 97 mg/dL (ref 65–99)
Potassium: 4.9 mmol/L (ref 3.5–5.1)
SODIUM: 124 mmol/L — AB (ref 135–145)
TOTAL PROTEIN: 7.1 g/dL (ref 6.5–8.1)
Total Bilirubin: 1 mg/dL (ref 0.3–1.2)

## 2015-11-13 LAB — CBC
HCT: 35.9 % — ABNORMAL LOW (ref 36.0–46.0)
Hemoglobin: 12.4 g/dL (ref 12.0–15.0)
MCH: 31.8 pg (ref 26.0–34.0)
MCHC: 34.5 g/dL (ref 30.0–36.0)
MCV: 92.1 fL (ref 78.0–100.0)
Platelets: 318 10*3/uL (ref 150–400)
RBC: 3.9 MIL/uL (ref 3.87–5.11)
RDW: 12.3 % (ref 11.5–15.5)
WBC: 6.4 10*3/uL (ref 4.0–10.5)

## 2015-11-13 LAB — URINALYSIS, ROUTINE W REFLEX MICROSCOPIC
Bilirubin Urine: NEGATIVE
Glucose, UA: NEGATIVE mg/dL
Hgb urine dipstick: NEGATIVE
Ketones, ur: NEGATIVE mg/dL
LEUKOCYTES UA: NEGATIVE
NITRITE: NEGATIVE
PROTEIN: NEGATIVE mg/dL
SPECIFIC GRAVITY, URINE: 1.002 — AB (ref 1.005–1.030)
pH: 7.5 (ref 5.0–8.0)

## 2015-11-13 LAB — C DIFFICILE QUICK SCREEN W PCR REFLEX
C Diff antigen: NEGATIVE
C Diff interpretation: NOT DETECTED
C Diff toxin: NEGATIVE

## 2015-11-13 LAB — OSMOLALITY: OSMOLALITY: 257 mosm/kg — AB (ref 275–295)

## 2015-11-13 LAB — LIPASE, BLOOD: Lipase: 24 U/L (ref 11–51)

## 2015-11-13 MED ORDER — SODIUM CHLORIDE 0.9 % IV BOLUS (SEPSIS)
1000.0000 mL | Freq: Once | INTRAVENOUS | Status: AC
  Administered 2015-11-13: 1000 mL via INTRAVENOUS

## 2015-11-13 MED ORDER — FAMOTIDINE IN NACL 20-0.9 MG/50ML-% IV SOLN
20.0000 mg | Freq: Two times a day (BID) | INTRAVENOUS | Status: DC
  Administered 2015-11-13 – 2015-11-14 (×2): 20 mg via INTRAVENOUS
  Filled 2015-11-13 (×2): qty 50

## 2015-11-13 MED ORDER — ALPRAZOLAM 0.5 MG PO TABS: 0.5000 mg | ORAL_TABLET | Freq: Two times a day (BID) | ORAL | Status: DC | PRN

## 2015-11-13 MED ORDER — ALBUTEROL SULFATE (2.5 MG/3ML) 0.083% IN NEBU: 3.0000 mL | INHALATION_SOLUTION | RESPIRATORY_TRACT | Status: DC | PRN

## 2015-11-13 MED ORDER — ALPRAZOLAM 0.5 MG PO TABS
0.5000 mg | ORAL_TABLET | Freq: Two times a day (BID) | ORAL | Status: DC | PRN
  Administered 2015-11-13: 0.5 mg via ORAL
  Filled 2015-11-13: qty 1

## 2015-11-13 MED ORDER — SODIUM CHLORIDE 0.9 % IV SOLN
INTRAVENOUS | Status: DC
  Administered 2015-11-13: 23:00:00 via INTRAVENOUS

## 2015-11-13 MED ORDER — CHLORHEXIDINE GLUCONATE 0.12 % MT SOLN
15.0000 mL | Freq: Two times a day (BID) | OROMUCOSAL | Status: DC
  Administered 2015-11-13 – 2015-11-14 (×2): 15 mL via OROMUCOSAL
  Filled 2015-11-13 (×3): qty 15

## 2015-11-13 MED ORDER — BISMUTH SUBSALICYLATE 262 MG/15ML PO SUSP
30.0000 mL | Freq: Three times a day (TID) | ORAL | Status: DC
  Administered 2015-11-13 – 2015-11-14 (×2): 30 mL via ORAL
  Filled 2015-11-13: qty 118

## 2015-11-13 NOTE — ED Notes (Addendum)
Pt complains of diarrhea and fatigue since yesterday. Pt states she finished a z-pac yesterday for sinus infection. Pt states she has more than 5 loose stools today. Pt denies fevers, abdominal pain, nausea, vomiting.

## 2015-11-13 NOTE — ED Provider Notes (Signed)
CSN: [id number]     Arrival date & time 11/13/15  1653 History   First MD Initiated Contact with Patient 11/13/15 2030     Chief Complaint  Patient presents with  . Diarrhea  . Weakness     Patient is a 60 y.o. female presenting with diarrhea and weakness. The history is provided by the patient.  Diarrhea Associated symptoms: abdominal pain   Weakness Associated symptoms include abdominal pain. Pertinent negatives include no chest pain.  Patient presents with watery diarrhea for the last few days. She was seen in the ER several days ago for nausea vomiting and diarrhea with dehydration. Thought that it may have been caused from her amoxicillin. She was feeling better for a day and then went to an urgent care she was feeling bad again. She states that they saw an ear infection so he started her on azithromycin. She did 5 days of azithromycin developed diarrhea again. 7 watery diarrhea. dull abdominal pain. She states that her natural pathic doctor is treating her for leaky gut syndrome it was pretty well controlled up until recently. She states he is sensitive to medicines. She states she also has Lyme disease and chronic fatigue syndrome. Also has fibromyalgia.  She states she had been able to drink some coconut water and is feeling much less lightheaded that she was earlier today.  Past Medical History  Diagnosis Date  . Hypertension   . Lyme disease   . Fibromyalgia   . Anxiety   . Lyme disease    Past Surgical History  Procedure Laterality Date  . Cataract removal with lens implant placement Left 03-06-2015   Family History  Problem Relation Age of Onset  . Heart disease Mother     CHF  . Hypertension Mother   . Cancer Father 66    lung cancer  . Hypertension Paternal Grandmother   . Stroke Paternal Grandmother   . Hypertension Paternal Grandfather    Social History  Substance Use Topics  . Smoking status: Never Smoker   . Smokeless tobacco: None  . Alcohol Use: No   Comment: Ocaasional glass of wine   OB History    No data available     Review of Systems  Constitutional: Positive for fatigue.  Respiratory: Negative for chest tightness.   Cardiovascular: Negative for chest pain.  Gastrointestinal: Positive for abdominal pain and diarrhea.  Genitourinary: Negative for frequency.  Musculoskeletal: Negative for gait problem.  Skin: Negative for wound.  Neurological: Positive for weakness and light-headedness.      Allergies  Prednisone; Sulfa antibiotics; Sulfacetamide sodium; and Wheat bran  Home Medications   Prior to Admission medications   Medication Sig Start Date End Date Taking? Authorizing Provider  ALPRAZolam (XANAX) 0.5 MG tablet TAKE 1 TABLET BY MOUTH DAILY AS NEEDED FOR ANXIETY Patient taking differently: TAKE 1-2 TABLET BY MOUTH DAILY AS NEEDED FOR ANXIETY 08/31/15  Yes Eulas Post, MD  b complex vitamins tablet Take 1 tablet by mouth daily. B-SUPREME   Yes Historical Provider, MD  Cholecalciferol (VITAMIN D-3) 5000 UNITS TABS Take 5,000 tablets by mouth daily.   Yes Historical Provider, MD  Coenzyme Q10 (CO Q10 PO) Take 400 mg by mouth daily.   Yes Historical Provider, MD  CYANOCOBALAMIN-METHYLCOBALAMIN SL Inject 1 mL as directed every 7 (seven) days.    Yes Historical Provider, MD  LEVOMEFOLIC ACID PO Take 1 capsule by mouth daily. L-5 MTHF   Yes Historical Provider, MD  NALTREXONE HCL PO Take  1.5 mg by mouth daily.   Yes Historical Provider, MD  Omega-3 Fatty Acids (FISH OIL PO) Take 2 capsules by mouth 2 (two) times daily. BARLEANS FISH OIL   Yes Historical Provider, MD  ondansetron (ZOFRAN ODT) 8 MG disintegrating tablet Take 1 tablet (8 mg total) by mouth every 8 (eight) hours as needed for nausea or vomiting. 11/05/15  Yes Dorie Rank, MD  OVER THE COUNTER MEDICATION Take 2 capsules by mouth daily. S-Acetyl Glutathione (SYNERGY)   Yes Historical Provider, MD  OVER THE COUNTER MEDICATION Take 325 mg by mouth 2 (two) times  daily. NATURAL CALM (magnesium carbonate and citric acid)-IONIC MAGNESIUM CITRATE   Yes Historical Provider, MD  PROAIR HFA 108 (90 Base) MCG/ACT inhaler Inhale 2 puffs into the lungs every 4 (four) hours as needed. For shortness of breath 10/11/15  Yes Historical Provider, MD  Probiotic Product (PROBIOTIC ADVANCED PO) Take 1 capsule by mouth 2 (two) times daily. 100 BILLION   Yes Historical Provider, MD  Progesterone Micronized (PROGESTERONE PO) Take 50 mg by mouth.   Yes Historical Provider, MD  Testosterone 10 MG/ACT (2%) GEL Place 0.25 mLs onto the skin daily.   Yes Historical Provider, MD  amoxicillin (AMOXIL) 500 MG capsule Take 2 capsules po bid for 15 days Patient taking differently: Take 1,000 mg by mouth 2 (two) times daily. Take 2 capsules po bid for 15 days 10/31/15   Eulas Post, MD  liothyronine (CYTOMEL) 5 MCG tablet Take 5 mcg by mouth 2 (two) times daily. 10/30/15   Historical Provider, MD  Misc. Devices (TOPICAL DISPENSER) MISC Apply 1 each topically daily. BI-EST 5 CREAM.  4 CLICKS DAILY (Biest (biestrogen) containing 20% estradiol and 80% estriol)    Historical Provider, MD  omeprazole (PRILOSEC) 20 MG capsule Take 1 capsule (20 mg total) by mouth daily. Patient not taking: Reported on 11/13/2015 11/05/15   Dorie Rank, MD   BP 125/81 mmHg  Pulse 95  Temp(Src) 98 F (36.7 C) (Oral)  Resp 20  Ht 5\' 7"  (1.702 m)  Wt 131 lb 13.4 oz (59.8 kg)  BMI 20.64 kg/m2  SpO2 100%  LMP 03/15/2013 Physical Exam  Constitutional: She appears well-developed.  HENT:  Head: Atraumatic.  Eyes: EOM are normal.  Neck: Neck supple.  Cardiovascular: Normal rate.   Pulmonary/Chest: Effort normal.  Abdominal: There is no tenderness.  Musculoskeletal: She exhibits no edema.  Neurological: She is alert.  Skin: Skin is warm.    ED Course  Procedures (including critical care time) Labs Review Labs Reviewed  COMPREHENSIVE METABOLIC PANEL - Abnormal; Notable for the following:    Sodium 124  (*)    Chloride 91 (*)    All other components within normal limits  CBC - Abnormal; Notable for the following:    HCT 35.9 (*)    All other components within normal limits  URINALYSIS, ROUTINE W REFLEX MICROSCOPIC (NOT AT [facility]) - Abnormal; Notable for the following:    Specific Gravity, Urine 1.002 (*)    All other components within normal limits  OSMOLALITY - Abnormal; Notable for the following:    Osmolality 257 (*)    All other components within normal limits  C DIFFICILE QUICK SCREEN W PCR REFLEX  LIPASE, BLOOD  SODIUM, URINE, RANDOM  OSMOLALITY, URINE  COMPREHENSIVE METABOLIC PANEL  CBC WITH DIFFERENTIAL/PLATELET    Imaging Review No results found. I have personally reviewed and evaluated these images and lab results as part of my medical decision-making.   EKG  Interpretation None      MDM   Final diagnoses:  Dehydration  Hyponatremia    Patient with diarrhea. Has hyponatremia of 124. It is somewhat improved from where it had been on her last visit but with current active diarrhea that she would probably benefit from an observation for some more prolonged IV fluids. States she was feeling weak but is a little better now. C. difficile to be sent on stool.    Davonna Belling, MD 11/14/15 [phone]

## 2015-11-13 NOTE — ED Notes (Signed)
Provided ice water and pillow.

## 2015-11-13 NOTE — H&P (Signed)
History and Physical    Katie [name] [id number] DOB: 10-02-1955 DOA: 11/13/2015  PCP: Eulas Post, MD   Patient coming from: Home.  Chief Complaint: Loose stools.  HPI: Katie [name] is a 60 y.o. female with medical history significant of hypertension, lyme disease, fibromyalgia, anxiety who comes to the ER with complaint of loose stools since Saturday.  Per patient she has had treatment for URI/otitis in the last two weeks, first unsuccessfully with amoxicillin and subsequently with azithromycin over the weekend. She states that she started loose stools Saturday about one to two episodes a day, which continued through Sunday and Monday. However, today she states that she has had already 5 episodes of diarrhea and felt subjective low grade fever. She denies abdominal pain, melena or hematochezia. She complains also of palpitations, mild SOB and anxiety which improved after she took one of her alprazolam tablets.   ED Course: Work up shows hyponatremia at 124 mmol/L and negative C.diff toxin.  Review of Systems: As per HPI otherwise 10 point review of systems negative.   Past Medical History  Diagnosis Date  . Hypertension   . Lyme disease   . Fibromyalgia   . Anxiety   . Lyme disease     Past Surgical History  Procedure Laterality Date  . Cataract removal with lens implant placement Left 03-06-2015     reports that she has never smoked. She does not have any smokeless tobacco history on file. She reports that she does not drink alcohol or use illicit drugs.  Allergies  Allergen Reactions  . Prednisone Other (See Comments)    Was told not to use because of Lymes disease. States she also cannot use steroid drops.   . Sulfa Antibiotics     Caused white count to decrease  . Sulfacetamide Sodium     Caused white count to decrease  . Wheat Bran Other (See Comments)    Family History  Problem Relation Age of Onset  . Heart disease Mother     CHF  . Hypertension  Mother   . Cancer Father 36    lung cancer  . Hypertension Paternal Grandmother   . Stroke Paternal Grandmother   . Hypertension Paternal Grandfather     Prior to Admission medications   Medication Sig Start Date End Date Taking? Authorizing Provider  ALPRAZolam (XANAX) 0.5 MG tablet TAKE 1 TABLET BY MOUTH DAILY AS NEEDED FOR ANXIETY Patient taking differently: TAKE 1-2 TABLET BY MOUTH DAILY AS NEEDED FOR ANXIETY 08/31/15  Yes Eulas Post, MD  b complex vitamins tablet Take 1 tablet by mouth daily. B-SUPREME   Yes Historical Provider, MD  Cholecalciferol (VITAMIN D-3) 5000 UNITS TABS Take 5,000 tablets by mouth daily.   Yes Historical Provider, MD  Coenzyme Q10 (CO Q10 PO) Take 400 mg by mouth daily.   Yes Historical Provider, MD  CYANOCOBALAMIN-METHYLCOBALAMIN SL Inject 1 mL as directed every 7 (seven) days.    Yes Historical Provider, MD  LEVOMEFOLIC ACID PO Take 1 capsule by mouth daily. L-5 MTHF   Yes Historical Provider, MD  NALTREXONE HCL PO Take 1.5 mg by mouth daily.   Yes Historical Provider, MD  Omega-3 Fatty Acids (FISH OIL PO) Take 2 capsules by mouth 2 (two) times daily. BARLEANS FISH OIL   Yes Historical Provider, MD  ondansetron (ZOFRAN ODT) 8 MG disintegrating tablet Take 1 tablet (8 mg total) by mouth every 8 (eight) hours as needed for nausea or vomiting. 11/05/15  Yes  Dorie Rank, MD  OVER THE COUNTER MEDICATION Take 2 capsules by mouth daily. S-Acetyl Glutathione (SYNERGY)   Yes Historical Provider, MD  OVER THE COUNTER MEDICATION Take 325 mg by mouth 2 (two) times daily. NATURAL CALM (magnesium carbonate and citric acid)-IONIC MAGNESIUM CITRATE   Yes Historical Provider, MD  PROAIR HFA 108 (90 Base) MCG/ACT inhaler Inhale 2 puffs into the lungs every 4 (four) hours as needed. For shortness of breath 10/11/15  Yes Historical Provider, MD  Probiotic Product (PROBIOTIC ADVANCED PO) Take 1 capsule by mouth 2 (two) times daily. 100 BILLION   Yes Historical Provider, MD    Progesterone Micronized (PROGESTERONE PO) Take 50 mg by mouth.   Yes Historical Provider, MD  Testosterone 10 MG/ACT (2%) GEL Place 0.25 mLs onto the skin daily.   Yes Historical Provider, MD  amoxicillin (AMOXIL) 500 MG capsule Take 2 capsules po bid for 15 days Patient taking differently: Take 1,000 mg by mouth 2 (two) times daily. Take 2 capsules po bid for 15 days 10/31/15   Eulas Post, MD  liothyronine (CYTOMEL) 5 MCG tablet Take 5 mcg by mouth 2 (two) times daily. 10/30/15   Historical Provider, MD  Misc. Devices (TOPICAL DISPENSER) MISC Apply 1 each topically daily. BI-EST 5 CREAM.  4 CLICKS DAILY (Biest (biestrogen) containing 20% estradiol and 80% estriol)    Historical Provider, MD  omeprazole (PRILOSEC) 20 MG capsule Take 1 capsule (20 mg total) by mouth daily. Patient not taking: Reported on 11/13/2015 11/05/15   Dorie Rank, MD    Physical Exam: Filed Vitals:   11/13/15 1658 11/13/15 1847 11/13/15 2102  BP: 144/81 133/81 146/87  Pulse: 78 89 85  Temp: 98.3 F (36.8 C) 97.9 F (36.6 C) 97.6 F (36.4 C)  TempSrc: Oral Oral Oral  Resp: 16 15 16   Height: 5\' 7"  (1.702 m)    Weight: 58.968 kg (130 lb)    SpO2: 90% 100% 100%      Constitutional: NAD, calm, comfortable Filed Vitals:   11/13/15 1658 11/13/15 1847 11/13/15 2102  BP: 144/81 133/81 146/87  Pulse: 78 89 85  Temp: 98.3 F (36.8 C) 97.9 F (36.6 C) 97.6 F (36.4 C)  TempSrc: Oral Oral Oral  Resp: 16 15 16   Height: 5\' 7"  (1.702 m)    Weight: 58.968 kg (130 lb)    SpO2: 90% 100% 100%   Eyes: PERRL, lids and conjunctivae normal ENMT: Mucous membranes and lips are dry. Posterior pharynx clear of any exudate or lesions.Normal dentition.  Neck: normal, supple, no masses, no thyromegaly Respiratory: Clear to auscultation bilaterally, no wheezing, no crackles. Normal respiratory effort. No accessory muscle use.  Cardiovascular: Regular rate and rhythm, no murmurs / rubs / gallops. No extremity edema. 2+ pedal  pulses. No carotid bruits.  Abdomen: Bowel sounds hyperactive. no tenderness, no masses palpated. No hepatosplenomegaly.  Musculoskeletal: no clubbing / cyanosis. No joint deformity upper and lower extremities. Good ROM, no contractures. Normal muscle tone.  Skin: no rashes, lesions, ulcers. No induration Neurologic: CN 2-12 grossly intact. Sensation intact, DTR normal. Strength 5/5 in all 4.  Psychiatric: Normal judgment and insight. Alert and oriented x 4. Normal mood.     Labs on Admission: I have personally reviewed following labs and imaging studies  CBC:  Recent Labs Lab 11/13/15 1819  WBC 6.4  HGB 12.4  HCT 35.9*  MCV 92.1  PLT 0000000   Basic Metabolic Panel:  Recent Labs Lab 11/13/15 1819  NA 124*  K 4.9  CL 91*  CO2 27  GLUCOSE 97  BUN 6  CREATININE 0.61  CALCIUM 9.2   GFR: Estimated Creatinine Clearance: 69.7 mL/min (by C-G formula based on Cr of 0.61). Liver Function Tests:  Recent Labs Lab 11/13/15 1819  AST 20  ALT 16  ALKPHOS 56  BILITOT 1.0  PROT 7.1  ALBUMIN 4.1    Recent Labs Lab 11/13/15 1819  LIPASE 24   Urine analysis:    Component Value Date/Time   COLORURINE YELLOW 11/13/2015 1830   APPEARANCEUR CLEAR 11/13/2015 1830   LABSPEC 1.002* 11/13/2015 1830   PHURINE 7.5 11/13/2015 1830   GLUCOSEU NEGATIVE 11/13/2015 1830   HGBUR NEGATIVE 11/13/2015 1830   BILIRUBINUR NEGATIVE 11/13/2015 1830   KETONESUR NEGATIVE 11/13/2015 1830   PROTEINUR NEGATIVE 11/13/2015 1830   UROBILINOGEN 0.2 07/01/2011 0302   NITRITE NEGATIVE 11/13/2015 1830   LEUKOCYTESUR NEGATIVE 11/13/2015 1830    Recent Results (from the past 240 hour(s))  Urine culture     Status: Abnormal   Collection Time: 11/04/15 10:55 PM  Result Value Ref Range Status   Specimen Description URINE, RANDOM  Final   Special Requests NONE  Final   Culture (A)  Final    8,000 COLONIES/mL INSIGNIFICANT GROWTH Performed at [facility]    Report Status 11/06/2015 FINAL   Final      Assessment/Plan Principal Problem:   Enteritis Likely due to Zithromax side effect. Observation/Medsurg  C.diff toxin was negative. Continue IVF. Clear liquids diet.  Active Problems:   Hyponatremia Likely due to GI loses. Continue normal saline infusion. Check serum osmolality. Check urine sodium and osmolality. Consider expanding work up if no improvement.    Anxiety and depression No depressive symptoms States she just feels anxious from the symptoms and being in the hospital. Continue PRN alprazolam. Patient to follow up with her PCP for further management.     DVT prophylaxis: SCDs Code Status: Full code. Family Communication:  Disposition Plan: Admit overnight for IV hydration and hyponatremia work up. The patient may like to be discharged early if possible as she has a planned trip with her husband.  Consults called:  Admission status: Observation/Medsurg   Reubin Milan MD [facility] Pager [phone].  If 7PM-7AM, please contact night-coverage [email or web address] Password [email or web address]  11/13/2015, 9:15 PM

## 2015-11-14 DIAGNOSIS — E871 Hypo-osmolality and hyponatremia: Secondary | ICD-10-CM

## 2015-11-14 DIAGNOSIS — K529 Noninfective gastroenteritis and colitis, unspecified: Secondary | ICD-10-CM

## 2015-11-14 DIAGNOSIS — R197 Diarrhea, unspecified: Secondary | ICD-10-CM

## 2015-11-14 DIAGNOSIS — E860 Dehydration: Secondary | ICD-10-CM

## 2015-11-14 DIAGNOSIS — R69 Illness, unspecified: Secondary | ICD-10-CM | POA: Diagnosis not present

## 2015-11-14 DIAGNOSIS — F418 Other specified anxiety disorders: Secondary | ICD-10-CM

## 2015-11-14 LAB — CBC WITH DIFFERENTIAL/PLATELET
Basophils Absolute: 0.1 10*3/uL (ref 0.0–0.1)
Basophils Relative: 1 %
EOS ABS: 0.4 10*3/uL (ref 0.0–0.7)
EOS PCT: 7 %
HCT: 32.6 % — ABNORMAL LOW (ref 36.0–46.0)
Hemoglobin: 11.2 g/dL — ABNORMAL LOW (ref 12.0–15.0)
LYMPHS ABS: 2 10*3/uL (ref 0.7–4.0)
LYMPHS PCT: 40 %
MCH: 32.1 pg (ref 26.0–34.0)
MCHC: 34.4 g/dL (ref 30.0–36.0)
MCV: 93.4 fL (ref 78.0–100.0)
MONO ABS: 0.6 10*3/uL (ref 0.1–1.0)
MONOS PCT: 11 %
Neutro Abs: 2.1 10*3/uL (ref 1.7–7.7)
Neutrophils Relative %: 41 %
PLATELETS: 271 10*3/uL (ref 150–400)
RBC: 3.49 MIL/uL — ABNORMAL LOW (ref 3.87–5.11)
RDW: 12.5 % (ref 11.5–15.5)
WBC: 5.1 10*3/uL (ref 4.0–10.5)

## 2015-11-14 LAB — COMPREHENSIVE METABOLIC PANEL
ALBUMIN: 3.5 g/dL (ref 3.5–5.0)
ALT: 15 U/L (ref 14–54)
AST: 16 U/L (ref 15–41)
Alkaline Phosphatase: 53 U/L (ref 38–126)
Anion gap: 5 (ref 5–15)
BILIRUBIN TOTAL: 0.7 mg/dL (ref 0.3–1.2)
BUN: 8 mg/dL (ref 6–20)
CHLORIDE: 104 mmol/L (ref 101–111)
CO2: 24 mmol/L (ref 22–32)
CREATININE: 0.59 mg/dL (ref 0.44–1.00)
Calcium: 8.6 mg/dL — ABNORMAL LOW (ref 8.9–10.3)
GFR calc Af Amer: >60 mL/min (ref >60)
GFR calc non Af Amer: >60 mL/min (ref >60)
GLUCOSE: 104 mg/dL — AB (ref 65–99)
POTASSIUM: 4.2 mmol/L (ref 3.5–5.1)
Sodium: 133 mmol/L — ABNORMAL LOW (ref 135–145)
Total Protein: 6.3 g/dL — ABNORMAL LOW (ref 6.5–8.1)

## 2015-11-14 LAB — SODIUM, URINE, RANDOM: SODIUM UR: 16 mmol/L

## 2015-11-14 LAB — OSMOLALITY, URINE: OSMOLALITY UR: 125 mosm/kg — AB (ref 300–900)

## 2015-11-14 MED ORDER — LOPERAMIDE HCL 2 MG PO TABS
2.0000 mg | ORAL_TABLET | Freq: Four times a day (QID) | ORAL | Status: DC | PRN
Start: 2015-11-14 — End: 2016-04-15

## 2015-11-14 NOTE — Discharge Summary (Signed)
Physician Discharge Summary  Katie [name] [id number] DOB: 1955/07/25 DOA: 11/13/2015  PCP: Eulas Post, MD  Admit date: 11/13/2015 Discharge date: 11/14/2015  Admitted From:home Disposition:  home  Recommendations for Outpatient Follow-up:  1. Follow up with PCP in 2 weeks   Home Health:no Equipment/Devices:* none  Discharge Condition: stable CODE STATUS: full code Diet recommendation: regular   Discharge Diagnoses:  Principal Problem:   Acute gastroenteritis  Active Problems:   Anxiety and depression   Enteritis   Hyponatremia   Diarrhea  Brief summary/ HPI 60 y.o. female with medical history significant of hypertension, lyme disease, fibromyalgia, anxiety who comes to the ER with complaint of loose stools since Past 3 days. Patient reportedly was treated for sinusitis/URI, initially with amoxicillin and then with azithromycin over the past 2 weeks. After completion of antibiotics she started having watery diarrhea and subjective low-grade fever, worsened on the day of admission. She denied any abdominal pain or bleeding per rectum. Patient also complained of some palpitations and mild shortness of breath exacerbated by anxiety symptoms. In the ED vitals were stable. Blood work showed sodium 124. Stool for C. difficile was negative. Admitted under observation and IV hydration.     Hospital course Acute gastroenteritis Suspect secondary to recent antibiotic use. Stool for C. difficile negative. Symptoms resolved today. Hydrated with IV fluids. Patient instructed to take Lomotil as needed if she has further symptoms.  Dehydration with hyponatremia Secondary to her gastroenteritis. Improved with IV fluids. Encouraged hydration. Follow-up with PCP in 2 weeks.  Anxiety Continue home medication.  Stable for discharge home.  Family communication: None at bedside   Discharge Instructions     Medication List    STOP taking these medications        amoxicillin 500 MG capsule  Commonly known as:  AMOXIL     omeprazole 20 MG capsule  Commonly known as:  PRILOSEC      TAKE these medications        ALPRAZolam 0.5 MG tablet  Commonly known as:  XANAX  TAKE 1 TABLET BY MOUTH DAILY AS NEEDED FOR ANXIETY     b complex vitamins tablet  Take 1 tablet by mouth daily. B-SUPREME     CO Q10 PO  Take 400 mg by mouth daily.     CYANOCOBALAMIN-METHYLCOBALAMIN SL  Inject 1 mL as directed every 7 (seven) days.     FISH OIL PO  Take 2 capsules by mouth 2 (two) times daily. BARLEANS FISH OIL     LEVOMEFOLIC ACID PO  Take 1 capsule by mouth daily. L-5 MTHF     liothyronine 5 MCG tablet  Commonly known as:  CYTOMEL  Take 5 mcg by mouth 2 (two) times daily.     loperamide 2 MG tablet  Commonly known as:  IMODIUM A-D  Take 1 tablet (2 mg total) by mouth 4 (four) times daily as needed for diarrhea or loose stools.     NALTREXONE HCL PO  Take 1.5 mg by mouth daily.     ondansetron 8 MG disintegrating tablet  Commonly known as:  ZOFRAN ODT  Take 1 tablet (8 mg total) by mouth every 8 (eight) hours as needed for nausea or vomiting.     OVER THE COUNTER MEDICATION  Take 2 capsules by mouth daily. S-Acetyl Glutathione (SYNERGY)     OVER THE COUNTER MEDICATION  Take 325 mg by mouth 2 (two) times daily. NATURAL CALM (magnesium carbonate and citric acid)-IONIC MAGNESIUM CITRATE  PROAIR HFA 108 (90 Base) MCG/ACT inhaler  Generic drug:  albuterol  Inhale 2 puffs into the lungs every 4 (four) hours as needed. For shortness of breath     PROBIOTIC ADVANCED PO  Take 1 capsule by mouth 2 (two) times daily. 100 BILLION     PROGESTERONE PO  Take 50 mg by mouth.     Testosterone 10 MG/ACT (2%) Gel  Place 0.25 mLs onto the skin daily.     Topical Dispenser Misc  Apply 1 each topically daily. BI-EST 5 CREAM.  4 CLICKS DAILY (Biest (biestrogen) containing 20% estradiol and 80% estriol)     Vitamin D-3 5000 UNITS Tabs  Take 5,000 tablets  by mouth daily.           Follow-up Information    Follow up with Eulas Post, MD. Schedule an appointment as soon as possible for a visit in 2 weeks.   Specialty:  Family Medicine   Contact information:   [address] [place] [address] [phone]      Allergies  Allergen Reactions  . Prednisone Other (See Comments)    Was told not to use because of Lymes disease. States she also cannot use steroid drops.   . Sulfa Antibiotics     Caused white count to decrease  . Sulfacetamide Sodium     Caused white count to decrease  . Wheat Bran Other (See Comments)    Consultations:  None   Procedures/Studies: None   Subjective: Feels better. No further diarrhea.  Discharge Exam: Filed Vitals:   11/13/15 2237 11/14/15 0648  BP: 125/81 113/73  Pulse: 95 73  Temp: 98 F (36.7 C) 97.7 F (36.5 C)  Resp: 20 20   Filed Vitals:   11/13/15 1847 11/13/15 2102 11/13/15 2237 11/14/15 0648  BP: 133/81 146/87 125/81 113/73  Pulse: 89 85 95 73  Temp: 97.9 F (36.6 C) 97.6 F (36.4 C) 98 F (36.7 C) 97.7 F (36.5 C)  TempSrc: Oral Oral Oral Oral  Resp: 15 16 20 20   Height:   5\' 7"  (1.702 m)   Weight:   59.8 kg (131 lb 13.4 oz)   SpO2: 100% 100% 100% 98%    General:Not in distress HEENT: No pallor, moist mucosa Chest: Clear bilaterally CVS: Normal S1 and S2, no murmurs GI: Soft, nondistended, nontender, bowel sounds present Musculoskeletal: Warm, no edema     The results of significant diagnostics from this hospitalization (including imaging, microbiology, ancillary and laboratory) are listed below for reference.     Microbiology: Recent Results (from the past 240 hour(s))  Urine culture     Status: Abnormal   Collection Time: 11/04/15 10:55 PM  Result Value Ref Range Status   Specimen Description URINE, RANDOM  Final   Special Requests NONE  Final   Culture (A)  Final    8,000 COLONIES/mL INSIGNIFICANT GROWTH Performed at [facility]    Report Status 11/06/2015 FINAL  Final  C difficile quick scan w PCR reflex     Status: None   Collection Time: 11/13/15  8:53 PM  Result Value Ref Range Status   C Diff antigen NEGATIVE NEGATIVE Final   C Diff toxin NEGATIVE NEGATIVE Final   C Diff interpretation No C. difficile detected.  Final     Labs: BNP (last 3 results) No results for input(s): BNP in the last 8760 hours. Basic Metabolic Panel:  Recent Labs Lab 11/13/15 1819 11/14/15 0343  NA 124* 133*  K  4.9 4.2  CL 91* 104  CO2 27 24  GLUCOSE 97 104*  BUN 6 8  CREATININE 0.61 0.59  CALCIUM 9.2 8.6*   Liver Function Tests:  Recent Labs Lab 11/13/15 1819 11/14/15 0343  AST 20 16  ALT 16 15  ALKPHOS 56 53  BILITOT 1.0 0.7  PROT 7.1 6.3*  ALBUMIN 4.1 3.5    Recent Labs Lab 11/13/15 1819  LIPASE 24   No results for input(s): AMMONIA in the last 168 hours. CBC:  Recent Labs Lab 11/13/15 1819 11/14/15 0343  WBC 6.4 5.1  NEUTROABS  --  2.1  HGB 12.4 11.2*  HCT 35.9* 32.6*  MCV 92.1 93.4  PLT 318 271   Cardiac Enzymes: No results for input(s): CKTOTAL, CKMB, CKMBINDEX, TROPONINI in the last 168 hours. BNP: Invalid input(s): POCBNP CBG: No results for input(s): GLUCAP in the last 168 hours. D-Dimer No results for input(s): DDIMER in the last 72 hours. Hgb A1c No results for input(s): HGBA1C in the last 72 hours. Lipid Profile No results for input(s): CHOL, HDL, LDLCALC, TRIG, CHOLHDL, LDLDIRECT in the last 72 hours. Thyroid function studies No results for input(s): TSH, T4TOTAL, T3FREE, THYROIDAB in the last 72 hours.  Invalid input(s): FREET3 Anemia work up No results for input(s): VITAMINB12, FOLATE, FERRITIN, TIBC, IRON, RETICCTPCT in the last 72 hours. Urinalysis    Component Value Date/Time   COLORURINE YELLOW 11/13/2015 1830   APPEARANCEUR CLEAR 11/13/2015 1830   LABSPEC 1.002* 11/13/2015 1830   PHURINE 7.5 11/13/2015 1830   GLUCOSEU NEGATIVE 11/13/2015 1830    HGBUR NEGATIVE 11/13/2015 1830   BILIRUBINUR NEGATIVE 11/13/2015 1830   KETONESUR NEGATIVE 11/13/2015 1830   PROTEINUR NEGATIVE 11/13/2015 1830   UROBILINOGEN 0.2 07/01/2011 0302   NITRITE NEGATIVE 11/13/2015 1830   LEUKOCYTESUR NEGATIVE 11/13/2015 1830   Sepsis Labs Invalid input(s): PROCALCITONIN,  WBC,  LACTICIDVEN Microbiology Recent Results (from the past 240 hour(s))  Urine culture     Status: Abnormal   Collection Time: 11/04/15 10:55 PM  Result Value Ref Range Status   Specimen Description URINE, RANDOM  Final   Special Requests NONE  Final   Culture (A)  Final    8,000 COLONIES/mL INSIGNIFICANT GROWTH Performed at [facility]    Report Status 11/06/2015 FINAL  Final  C difficile quick scan w PCR reflex     Status: None   Collection Time: 11/13/15  8:53 PM  Result Value Ref Range Status   C Diff antigen NEGATIVE NEGATIVE Final   C Diff toxin NEGATIVE NEGATIVE Final   C Diff interpretation No C. difficile detected.  Final     Time coordinating discharge: 25 minutes  SIGNED:   Louellen Molder, MD  [facility] 11/14/2015, 8:55 AM Pager   If 7PM-7AM, please contact night-coverage [email or web address] Password [email or web address]

## 2015-11-14 NOTE — Discharge Instructions (Signed)

## 2015-11-26 DIAGNOSIS — R5382 Chronic fatigue, unspecified: Secondary | ICD-10-CM | POA: Diagnosis not present

## 2015-11-26 DIAGNOSIS — R42 Dizziness and giddiness: Secondary | ICD-10-CM | POA: Diagnosis not present

## 2015-11-26 DIAGNOSIS — R531 Weakness: Secondary | ICD-10-CM | POA: Diagnosis not present

## 2015-11-26 DIAGNOSIS — Z880 Allergy status to penicillin: Secondary | ICD-10-CM | POA: Diagnosis not present

## 2015-11-26 DIAGNOSIS — R51 Headache: Secondary | ICD-10-CM | POA: Diagnosis not present

## 2015-11-27 ENCOUNTER — Other Ambulatory Visit: Payer: Self-pay | Admitting: Family Medicine

## 2015-11-28 NOTE — Telephone Encounter (Signed)
Last OV 10/31/2015  Pending hospital f/u 12/04/2015  Last refill 08/31/15 #30, 2 rf Please review

## 2015-11-29 NOTE — Telephone Encounter (Signed)
Refill with 2 additional refills. 

## 2015-11-29 NOTE — Telephone Encounter (Signed)
Pt requesting Alprazolam 0.5mg  tab  #30 refills:2  Last filled 08/31/15   Pt last seen in office 10/31/15 for an acute issue  Last CPE 12/02/13   Okay to refill?

## 2015-11-29 NOTE — Telephone Encounter (Signed)
I had already responded to this earlier.  Refill OK with 2 additional refills.

## 2015-12-04 ENCOUNTER — Ambulatory Visit: Payer: Medicare HMO | Admitting: Family Medicine

## 2015-12-07 ENCOUNTER — Telehealth: Payer: Self-pay | Admitting: Family Medicine

## 2015-12-07 ENCOUNTER — Ambulatory Visit (INDEPENDENT_AMBULATORY_CARE_PROVIDER_SITE_OTHER): Payer: Medicare HMO | Admitting: Family Medicine

## 2015-12-07 VITALS — BP 120/90 | HR 102 | Temp 98.2°F | Ht 67.0 in | Wt 132.0 lb

## 2015-12-07 DIAGNOSIS — E871 Hypo-osmolality and hyponatremia: Secondary | ICD-10-CM

## 2015-12-07 DIAGNOSIS — R197 Diarrhea, unspecified: Secondary | ICD-10-CM

## 2015-12-07 DIAGNOSIS — J329 Chronic sinusitis, unspecified: Secondary | ICD-10-CM | POA: Diagnosis not present

## 2015-12-07 LAB — BASIC METABOLIC PANEL
BUN: 11 mg/dL (ref 6–23)
CO2: 30 mEq/L (ref 19–32)
CREATININE: 0.68 mg/dL (ref 0.40–1.20)
Calcium: 9.4 mg/dL (ref 8.4–10.5)
Chloride: 101 mEq/L (ref 96–112)
GFR: 93.5 mL/min (ref >60.00)
GLUCOSE: 87 mg/dL (ref 70–99)
POTASSIUM: 3.7 meq/L (ref 3.5–5.1)
Sodium: 138 mEq/L (ref 135–145)

## 2015-12-07 NOTE — Progress Notes (Signed)
Pre visit review using our clinic review tool, if applicable. No additional management support is needed unless otherwise documented below in the visit note. 

## 2015-12-07 NOTE — Telephone Encounter (Signed)
Cologuard is covered by her insurance, so she was wanting you to put in the order for here please.

## 2015-12-07 NOTE — Telephone Encounter (Signed)
Info completed and faxed.

## 2015-12-07 NOTE — Progress Notes (Signed)
Subjective:     Patient ID: Katie [name], female   DOB: 1956/02/09, 60 y.o.   MRN: [id number]  HPI Patient seen for ER follow-up and other issues as below  She has a long history of recurrent sinusitis. She had recent sinus infection and was placed on amoxicillin and then subsequently Zithromax. She developed some diarrhea and nausea without vomiting. She developed some profound weakness and went to ER and was admitted overnight. C. difficile testing negative. Sodium 124 which improved 133 at discharge. She improved after IV fluids. Had no diarrhea since then. She has a tendency toward developing diarrhea with ?IBS .  She is concerned because she has generally about 4 sinus infections per year. She's not had previous imaging. She has questions regarding whether she might need to see an ENT versus allergist.  Still has some pansinusitis symptoms.  Patient requesting referral to integrative health specialist at [facility]. She already has appointment set up for August  Past Medical History:  Diagnosis Date  . Anxiety   . Fibromyalgia   . Hypertension   . Lyme disease   . Lyme disease    Past Surgical History:  Procedure Laterality Date  . Cataract Removal With Lens Implant Placement Left 03-06-2015    reports that she has never smoked. She does not have any smokeless tobacco history on file. She reports that she does not drink alcohol or use drugs. family history includes Cancer (age of onset: 25) in her father; Heart disease in her mother; Hypertension in her mother, paternal grandfather, and paternal grandmother; Stroke in her paternal grandmother. Allergies  Allergen Reactions  . Prednisone Other (See Comments)    Was told not to use because of Lymes disease. States she also cannot use steroid drops.   . Sulfa Antibiotics     Caused white count to decrease  . Sulfacetamide Sodium     Caused white count to decrease  . Wheat Bran Other (See Comments)     Review of Systems   Constitutional: Positive for fatigue. Negative for appetite change, chills and unexpected weight change.  Respiratory: Negative for cough and shortness of breath.   Cardiovascular: Negative for chest pain.  Gastrointestinal: Negative for abdominal pain.  Skin: Negative for rash.  Neurological: Negative for dizziness and weakness.  Hematological: Negative for adenopathy. Does not bruise/bleed easily.       Objective:   Physical Exam  Constitutional: She appears well-developed and well-nourished.  Neck: Neck supple. No thyromegaly present.  Cardiovascular: Normal rate and regular rhythm.  Exam reveals no gallop.   Pulmonary/Chest: Effort normal and breath sounds normal. No respiratory distress. She has no wheezes. She has no rales.  Musculoskeletal: She exhibits no edema.       Assessment:     #1 recent diarrhea and nausea possibly related to viral illness versus antibiotic side effect. Resolved  #2 hyponatremia probably related to #1  #3 history of recurrent sinusitis      Plan:     -Repeat basic metabolic panel -Check CT maxillofacial. If normal consider allergy referral -We'll set up referral to Dr. Sharon Seller with [facility] that she alternate Gatorade with water intake -Patient is checking on coverage for Cologuard  Eulas Post MD [facility]

## 2015-12-07 NOTE — Patient Instructions (Signed)
Check on coverage for Cologuard We will set up CT of sinuses and make referral to [facility].

## 2015-12-10 ENCOUNTER — Telehealth: Payer: Self-pay

## 2015-12-10 ENCOUNTER — Telehealth: Payer: Self-pay | Admitting: Family Medicine

## 2015-12-10 NOTE — Telephone Encounter (Signed)
Spoke with pt this morning. She is aware of lab results. He had a CT scan of her sinus approx 1 year ago at [facility]. We are faxing her a release form to obtain those records. Will this be okay or does she need another one.

## 2015-12-10 NOTE — Telephone Encounter (Signed)
I have cancelled order. Will wait until CT scan is obtained and go from there.

## 2015-12-10 NOTE — Telephone Encounter (Signed)
Pt states she had called about her ongoing sinus issue. Pt was called and told she was to be scheduled for a cat scan. However, pt states she had a CT of her sinuses done at [facility] about a 1 1/2 yrs ago.  Pt is going to request those records, and would like to not do the ct dr Elease Hashimoto ordered, if that is OK

## 2015-12-10 NOTE — Telephone Encounter (Signed)
OK.  Will need to remove order for CT maxillofacial that I put in last week.

## 2015-12-10 NOTE — Telephone Encounter (Signed)
There is another message on this pt with similar details.

## 2015-12-18 DIAGNOSIS — Z1211 Encounter for screening for malignant neoplasm of colon: Secondary | ICD-10-CM | POA: Diagnosis not present

## 2015-12-18 DIAGNOSIS — Z1212 Encounter for screening for malignant neoplasm of rectum: Secondary | ICD-10-CM | POA: Diagnosis not present

## 2015-12-19 DIAGNOSIS — R197 Diarrhea, unspecified: Secondary | ICD-10-CM | POA: Diagnosis not present

## 2015-12-19 DIAGNOSIS — J329 Chronic sinusitis, unspecified: Secondary | ICD-10-CM | POA: Diagnosis not present

## 2015-12-19 DIAGNOSIS — Z882 Allergy status to sulfonamides status: Secondary | ICD-10-CM | POA: Diagnosis not present

## 2015-12-19 DIAGNOSIS — R5382 Chronic fatigue, unspecified: Secondary | ICD-10-CM | POA: Diagnosis not present

## 2015-12-19 DIAGNOSIS — R531 Weakness: Secondary | ICD-10-CM | POA: Diagnosis not present

## 2015-12-19 DIAGNOSIS — K580 Irritable bowel syndrome with diarrhea: Secondary | ICD-10-CM | POA: Diagnosis not present

## 2015-12-19 DIAGNOSIS — Z880 Allergy status to penicillin: Secondary | ICD-10-CM | POA: Diagnosis not present

## 2015-12-19 DIAGNOSIS — H9209 Otalgia, unspecified ear: Secondary | ICD-10-CM | POA: Diagnosis not present

## 2015-12-19 DIAGNOSIS — Z780 Asymptomatic menopausal state: Secondary | ICD-10-CM | POA: Diagnosis not present

## 2015-12-19 DIAGNOSIS — Z888 Allergy status to other drugs, medicaments and biological substances status: Secondary | ICD-10-CM | POA: Diagnosis not present

## 2015-12-19 DIAGNOSIS — R5383 Other fatigue: Secondary | ICD-10-CM | POA: Diagnosis not present

## 2015-12-28 LAB — COLOGUARD: Cologuard: NEGATIVE

## 2016-01-02 ENCOUNTER — Encounter: Payer: Self-pay | Admitting: Family Medicine

## 2016-01-02 DIAGNOSIS — W57XXXS Bitten or stung by nonvenomous insect and other nonvenomous arthropods, sequela: Secondary | ICD-10-CM | POA: Diagnosis not present

## 2016-01-02 DIAGNOSIS — R5383 Other fatigue: Secondary | ICD-10-CM | POA: Diagnosis not present

## 2016-01-02 DIAGNOSIS — E871 Hypo-osmolality and hyponatremia: Secondary | ICD-10-CM | POA: Diagnosis not present

## 2016-01-09 ENCOUNTER — Other Ambulatory Visit: Payer: Self-pay

## 2016-01-31 ENCOUNTER — Other Ambulatory Visit: Payer: Self-pay | Admitting: Family Medicine

## 2016-01-31 NOTE — Telephone Encounter (Signed)
Pt is leaving town by 11 am on 02-01-16

## 2016-01-31 NOTE — Telephone Encounter (Signed)
She got #30 with 2 refills 7-27 .  Should not need until Oct 27?  Please clarify early request.

## 2016-02-01 NOTE — Telephone Encounter (Signed)
I called the pharmacy and spoke with Caryl Pina and she stated the Rx from 7/27 was called in with only 1 refill, not 2 refills and the pt had the Rx filled on 7/27 and 8/29.  I advised Caryl Pina the Rx should have been for 2 additional refills and the one was called in.

## 2016-02-04 DIAGNOSIS — E872 Acidosis: Secondary | ICD-10-CM | POA: Diagnosis not present

## 2016-02-04 DIAGNOSIS — I10 Essential (primary) hypertension: Secondary | ICD-10-CM | POA: Diagnosis not present

## 2016-02-04 DIAGNOSIS — R531 Weakness: Secondary | ICD-10-CM | POA: Diagnosis not present

## 2016-02-04 DIAGNOSIS — E871 Hypo-osmolality and hyponatremia: Secondary | ICD-10-CM | POA: Diagnosis not present

## 2016-02-04 DIAGNOSIS — R69 Illness, unspecified: Secondary | ICD-10-CM | POA: Diagnosis not present

## 2016-02-04 DIAGNOSIS — Z880 Allergy status to penicillin: Secondary | ICD-10-CM | POA: Diagnosis not present

## 2016-02-04 DIAGNOSIS — R404 Transient alteration of awareness: Secondary | ICD-10-CM | POA: Diagnosis not present

## 2016-02-04 DIAGNOSIS — R5383 Other fatigue: Secondary | ICD-10-CM | POA: Diagnosis not present

## 2016-02-05 DIAGNOSIS — E872 Acidosis: Secondary | ICD-10-CM | POA: Diagnosis not present

## 2016-02-05 DIAGNOSIS — R531 Weakness: Secondary | ICD-10-CM | POA: Diagnosis not present

## 2016-02-05 DIAGNOSIS — I10 Essential (primary) hypertension: Secondary | ICD-10-CM | POA: Diagnosis not present

## 2016-02-05 DIAGNOSIS — E871 Hypo-osmolality and hyponatremia: Secondary | ICD-10-CM | POA: Diagnosis not present

## 2016-02-06 ENCOUNTER — Emergency Department (HOSPITAL_COMMUNITY)
Admission: EM | Admit: 2016-02-06 | Discharge: 2016-02-06 | Disposition: A | Discharge status: Home / Self Care | Payer: Medicare HMO | Attending: Emergency Medicine | Admitting: Emergency Medicine

## 2016-02-06 ENCOUNTER — Encounter (HOSPITAL_COMMUNITY): Payer: Self-pay

## 2016-02-06 DIAGNOSIS — H9202 Otalgia, left ear: Secondary | ICD-10-CM

## 2016-02-06 DIAGNOSIS — I10 Essential (primary) hypertension: Secondary | ICD-10-CM | POA: Insufficient documentation

## 2016-02-06 DIAGNOSIS — R531 Weakness: Secondary | ICD-10-CM | POA: Insufficient documentation

## 2016-02-06 LAB — CBC
HCT: 39.8 % (ref 36.0–46.0)
Hemoglobin: 13.5 g/dL (ref 12.0–15.0)
MCH: 32.7 pg (ref 26.0–34.0)
MCHC: 33.9 g/dL (ref 30.0–36.0)
MCV: 96.4 fL (ref 78.0–100.0)
PLATELETS: 269 10*3/uL (ref 150–400)
RBC: 4.13 MIL/uL (ref 3.87–5.11)
RDW: 12.3 % (ref 11.5–15.5)
WBC: 7.7 10*3/uL (ref 4.0–10.5)

## 2016-02-06 LAB — BASIC METABOLIC PANEL
Anion gap: 5 (ref 5–15)
BUN: 7 mg/dL (ref 6–20)
CALCIUM: 9.1 mg/dL (ref 8.9–10.3)
CO2: 28 mmol/L (ref 22–32)
CREATININE: 0.48 mg/dL (ref 0.44–1.00)
Chloride: 102 mmol/L (ref 101–111)
Glucose, Bld: 124 mg/dL — ABNORMAL HIGH (ref 65–99)
Potassium: 3.9 mmol/L (ref 3.5–5.1)
Sodium: 135 mmol/L (ref 135–145)

## 2016-02-06 NOTE — ED Provider Notes (Signed)
Emergency Department Provider Note   I have reviewed the triage vital signs and the nursing notes.   HISTORY  Chief Complaint Dehydration and Weakness   HPI Katie [name] is a 60 y.o. female with PMH of fibromyalgia, anxiety, and HTN presents to the emergency department for evaluation of fatigue and left ear pain. The patient was recently admitted and discharged from [facility] for hyponatremia. The patient states she's had low sodium for several years of unknown etiology. She notes it typically worsens with dehydration. She felt very fatigued at discharge but continued to drink fluids and Gatorade. This morning she felt much more fatigued and noticed left ear pain. She denies any nausea, vomiting, diarrhea. No chest pain or difficulty breathing. No back pain. She continues to have bowel movements and urinate without pain, hasn't seen, urgency. She scheduled to see her primary care physician on Monday for reevaluation of her ongoing hyponatremia. She did take leftover Amoxicillin x 1 today with concern for possible OTM.    Past Medical History:  Diagnosis Date  . Anxiety   . Fibromyalgia   . Hypertension   . Lyme disease   . Lyme disease     Patient Active Problem List   Diagnosis Date Noted  . Diarrhea 11/14/2015  . Acute gastroenteritis 11/14/2015  . Dehydration   . Enteritis 11/13/2015  . Hyponatremia 11/13/2015  . Elevated blood pressure 07/14/2013  . Lyme disease 10/25/2012  . Chronic fatigue syndrome 10/25/2012  . Anxiety and depression 10/25/2012  . Fibromyalgia 10/25/2012  . Hormone replacement therapy - followed by Dr. Lonia Skinner in [place] and by [place] 10/25/2012    Past Surgical History:  Procedure Laterality Date  . Cataract Removal With Lens Implant Placement Left 03-06-2015    Current Outpatient Rx  . Order #: [id number] Class: Phone In  . Order #: [id number] Class: Historical Med  . Order #: [id number] Class: Historical Med  .  Order #: [id number] Class: Historical Med  . Order #: [id number] Class: Historical Med  . Order #: [id number] Class: Historical Med  . Order #: [id number] Class: Normal  . Order #: [id number] Class: Historical Med  . Order #: [id number] Class: Historical Med  . Order #: [id number] Class: Historical Med  . Order #: [id number] Class: Historical Med  . Order #: [id number] Class: Historical Med  . Order #: [id number] Class: Historical Med  . Order #: [id number] Class: Historical Med  . Order #: [id number] Class: Historical Med  . Order #: [id number] Class: Historical Med    Allergies Prednisone; Sulfa antibiotics; Sulfacetamide sodium; and Wheat bran  Family History  Problem Relation Age of Onset  . Heart disease Mother     CHF  . Hypertension Mother   . Cancer Father 38    lung cancer  . Hypertension Paternal Grandmother   . Stroke Paternal Grandmother   . Hypertension Paternal Grandfather     Social History Social History  Substance Use Topics  . Smoking status: Never Smoker  . Smokeless tobacco: Never Used  . Alcohol use No     Comment: Ocaasional glass of wine    Review of Systems  Constitutional: No fever/chills. Positive generalized fatigue.  Eyes: No visual changes. ENT: No sore throat. Positive left ear pain.  Cardiovascular: Denies chest pain. Respiratory: Denies shortness of breath. Gastrointestinal: No abdominal pain.  No nausea, no vomiting.  No diarrhea.  No constipation. Genitourinary: Negative for dysuria. Musculoskeletal: Negative for back pain. Skin: Negative for rash. Neurological: Negative for headaches, focal weakness or numbness.  10-point ROS otherwise negative.  ____________________________________________   PHYSICAL EXAM:  VITAL SIGNS: ED Triage Vitals  Enc Vitals Group     BP 02/06/16 1320 (!) 143/106     Pulse Rate 02/06/16 1320 73     Resp 02/06/16 1320 18     Temp 02/06/16 1320 97.8 F (36.6 C)     Temp Source 02/06/16 1320 Oral     SpO2 02/06/16 1320  100 %     Pain Score 02/06/16 1318 5   Constitutional: Alert and oriented. Well appearing and in no acute distress. Eyes: Conjunctivae are normal. PERRL. Head: Atraumatic. Ears:  Healthy appearing ear canals. Left TM with mild fluid but no erythema, bulging, or clear sign of OTM. Normal right TM.  Nose: No congestion/rhinnorhea. Mouth/Throat: Mucous membranes are moist.  Oropharynx non-erythematous. Neck: No stridor.  Cardiovascular: Normal rate, regular rhythm. Good peripheral circulation. Grossly normal heart sounds.   Respiratory: Normal respiratory effort.  No retractions. Lungs CTAB. Gastrointestinal: Soft and nontender. No distention.  Musculoskeletal: No lower extremity tenderness nor edema. No gross deformities of extremities. Neurologic:  Normal speech and language. No gross focal neurologic deficits are appreciated.  Skin:  Skin is warm, dry and intact. No rash noted. Psychiatric: Mood and affect are normal. Speech and behavior are normal.  ____________________________________________   LABS (all labs ordered are listed, but only abnormal results are displayed)  Labs Reviewed  BASIC METABOLIC PANEL - Abnormal; Notable for the following:       Result Value   Glucose, Bld 124 (*)    All other components within normal limits  CBC   ____________________________________________  RADIOLOGY  None ____________________________________________   PROCEDURES  Procedure(s) performed:   Procedures  None ____________________________________________   INITIAL IMPRESSION / ASSESSMENT AND PLAN / ED COURSE  Pertinent labs & imaging results that were available during my care of the patient were reviewed by me and considered in my medical decision making (see chart for details).  Patient resents emergency room for generalized fatigue and left ear pain. She was discharged from [facility] for hyponatremia. She is eating and drinking normally at home. No chest pain or  difficult to breathing to suggest atypical ACS presentation. Repeat sodium here is 135. The patient will follow with her PCP on Monday to evaluate after her recent admission. No evidence of acute otitis media on the left. She does have mild fluid which may be secondary to developing viral illness or mild sinus drainage. Discussed return precautions in detail with the patient and husband at bedside.  At this time, I do not feel there is any life-threatening condition present. I have reviewed and discussed all results (EKG, imaging, lab, urine as appropriate), exam findings with patient. I have reviewed nursing notes and appropriate previous records.  I feel the patient is safe to be discharged home without further emergent workup. Discussed usual and customary return precautions. Patient and family (if present) verbalize understanding and are comfortable with this plan.  Patient will follow-up with their primary care provider. If they do not have a primary care provider, information for follow-up has been provided to them. All questions have been answered.  __________________________________________  FINAL CLINICAL IMPRESSION(S) / ED DIAGNOSES  Final diagnoses:  Weakness  Left ear pain     MEDICATIONS GIVEN DURING THIS VISIT:  None  NEW OUTPATIENT MEDICATIONS STARTED DURING THIS VISIT:  None   Note:  This document was prepared using Dragon voice recognition software and may include unintentional dictation errors.  Nanda Quinton, MD Emergency  Medicine   Margette Fast, MD 02/06/16 [phone]

## 2016-02-06 NOTE — ED Triage Notes (Signed)
Per pt, she was taken by ambulance to [facility] x 2 days ago and kept overnight for dehydration. Unknown cause for low sodium.  Pt d/c yesterday.  Coming today for weakness and earache.

## 2016-02-06 NOTE — Discharge Instructions (Signed)
You have been seen in the ED today with weakness and left ear pain. The ear does not look infected and does not require antibiotics at this time. Your sodium today with 135, which is normal. Follow up with your PCP on Monday for further evaluation of ongoing low sodium.   Return to the ED with any chest pain, difficulty breathing, abdominal pain, or diarrhea.

## 2016-02-11 ENCOUNTER — Telehealth: Payer: Self-pay | Admitting: Family Medicine

## 2016-02-11 ENCOUNTER — Emergency Department (HOSPITAL_BASED_OUTPATIENT_CLINIC_OR_DEPARTMENT_OTHER)
Admission: EM | Admit: 2016-02-11 | Discharge: 2016-02-11 | Disposition: A | Discharge status: Home / Self Care | Payer: Medicare HMO | Attending: Emergency Medicine | Admitting: Emergency Medicine

## 2016-02-11 ENCOUNTER — Encounter (HOSPITAL_BASED_OUTPATIENT_CLINIC_OR_DEPARTMENT_OTHER): Payer: Self-pay | Admitting: *Deleted

## 2016-02-11 ENCOUNTER — Ambulatory Visit: Payer: Medicare HMO | Admitting: Family Medicine

## 2016-02-11 DIAGNOSIS — E860 Dehydration: Secondary | ICD-10-CM | POA: Diagnosis not present

## 2016-02-11 DIAGNOSIS — R531 Weakness: Secondary | ICD-10-CM | POA: Insufficient documentation

## 2016-02-11 DIAGNOSIS — E871 Hypo-osmolality and hyponatremia: Secondary | ICD-10-CM | POA: Insufficient documentation

## 2016-02-11 LAB — URINALYSIS, ROUTINE W REFLEX MICROSCOPIC
BILIRUBIN URINE: NEGATIVE
Glucose, UA: NEGATIVE mg/dL
HGB URINE DIPSTICK: NEGATIVE
Ketones, ur: NEGATIVE mg/dL
Leukocytes, UA: NEGATIVE
Nitrite: NEGATIVE
PH: 6.5 (ref 5.0–8.0)
Protein, ur: NEGATIVE mg/dL
SPECIFIC GRAVITY, URINE: 1.003 — AB (ref 1.005–1.030)

## 2016-02-11 LAB — CBC WITH DIFFERENTIAL/PLATELET
Basophils Absolute: 0 10*3/uL (ref 0.0–0.1)
Basophils Relative: 1 %
EOS ABS: 0.3 10*3/uL (ref 0.0–0.7)
Eosinophils Relative: 9 %
HEMATOCRIT: 36.8 % (ref 36.0–46.0)
HEMOGLOBIN: 12.4 g/dL (ref 12.0–15.0)
LYMPHS ABS: 1 10*3/uL (ref 0.7–4.0)
LYMPHS PCT: 27 %
MCH: 33 pg (ref 26.0–34.0)
MCHC: 33.7 g/dL (ref 30.0–36.0)
MCV: 97.9 fL (ref 78.0–100.0)
MONOS PCT: 11 %
Monocytes Absolute: 0.4 10*3/uL (ref 0.1–1.0)
NEUTROS ABS: 2 10*3/uL (ref 1.7–7.7)
NEUTROS PCT: 52 %
Platelets: 237 10*3/uL (ref 150–400)
RBC: 3.76 MIL/uL — ABNORMAL LOW (ref 3.87–5.11)
RDW: 11.9 % (ref 11.5–15.5)
WBC: 3.8 10*3/uL — ABNORMAL LOW (ref 4.0–10.5)

## 2016-02-11 LAB — BASIC METABOLIC PANEL
ANION GAP: 5 (ref 5–15)
BUN: 8 mg/dL (ref 6–20)
CHLORIDE: 104 mmol/L (ref 101–111)
CO2: 26 mmol/L (ref 22–32)
Calcium: 8.5 mg/dL — ABNORMAL LOW (ref 8.9–10.3)
Creatinine, Ser: 0.68 mg/dL (ref 0.44–1.00)
GFR calc Af Amer: >60 mL/min (ref >60)
GFR calc non Af Amer: >60 mL/min (ref >60)
Glucose, Bld: 60 mg/dL — ABNORMAL LOW (ref 65–99)
POTASSIUM: 3.9 mmol/L (ref 3.5–5.1)
SODIUM: 135 mmol/L (ref 135–145)

## 2016-02-11 LAB — CBG MONITORING, ED: GLUCOSE-CAPILLARY: 77 mg/dL (ref 65–99)

## 2016-02-11 MED ORDER — SODIUM CHLORIDE 0.9 % IV BOLUS (SEPSIS)
1000.0000 mL | Freq: Once | INTRAVENOUS | Status: AC
  Administered 2016-02-11: 1000 mL via INTRAVENOUS

## 2016-02-11 NOTE — ED Provider Notes (Signed)
Emergency Department Provider Note   I have reviewed the triage vital signs and the nursing notes.   HISTORY  Chief Complaint Dehydration   HPI Katie [name] is a 60 y.o. female with PMH of fibromyalgia, anxiety, and HTN presents to the emergency pertinent for evaluation of intermittent generalized weakness. Patient believes this is secondary to hyponatremia and dehydration. She states she was scheduled to see her primary care physician today but because she was feeling so weak she did not think she could drive all the way to [facility] and presented to the emergency department here. She has felt slightly better since drinking some Gatorade this morning. Symptoms began rather suddenly while driving to take care of her autistic son. She denies any associated chest pain or difficulty breathing. No fevers. No headaches. No unilateral weakness or numbness.   Past Medical History:  Diagnosis Date  . Anxiety   . Fibromyalgia   . Hypertension   . Lyme disease   . Lyme disease     Patient Active Problem List   Diagnosis Date Noted  . Diarrhea 11/14/2015  . Acute gastroenteritis 11/14/2015  . Dehydration   . Enteritis 11/13/2015  . Hyponatremia 11/13/2015  . Elevated blood pressure 07/14/2013  . Lyme disease 10/25/2012  . Chronic fatigue syndrome 10/25/2012  . Anxiety and depression 10/25/2012  . Fibromyalgia 10/25/2012  . Hormone replacement therapy - followed by Dr. Lonia Skinner in gyn and by [place] 10/25/2012    Past Surgical History:  Procedure Laterality Date  . Cataract Removal With Lens Implant Placement Left 03-06-2015    Current Outpatient Rx  . Order #: [id number] Class: Phone In  . Order #: [id number] Class: Historical Med  . Order #: [id number] Class: Historical Med  . Order #: [id number] Class: Historical Med  . Order #: [id number] Class: Historical Med  . Order #: [id number] Class: Historical Med  . Order #: [id number] Class: Normal  . Order #:  [id number] Class: Historical Med  . Order #: [id number] Class: Historical Med  . Order #: [id number] Class: Historical Med  . Order #: [id number] Class: Historical Med  . Order #: [id number] Class: Historical Med  . Order #: [id number] Class: Historical Med  . Order #: [id number] Class: Historical Med  . Order #: [id number] Class: Historical Med  . Order #: [id number] Class: Historical Med    Allergies Prednisone; Sulfa antibiotics; Sulfacetamide sodium; and Wheat bran  Family History  Problem Relation Age of Onset  . Heart disease Mother     CHF  . Hypertension Mother   . Cancer Father 60    lung cancer  . Hypertension Paternal Grandmother   . Stroke Paternal Grandmother   . Hypertension Paternal Grandfather     Social History Social History  Substance Use Topics  . Smoking status: Never Smoker  . Smokeless tobacco: Never Used  . Alcohol use No     Comment: Ocaasional glass of wine    Review of Systems  Constitutional: No fever/chills. Sudden generalized weakness.  Eyes: No visual changes. ENT: No sore throat. Cardiovascular: Denies chest pain. Respiratory: Denies shortness of breath. Gastrointestinal: No abdominal pain.  No nausea, no vomiting.  No diarrhea.  No constipation. Genitourinary: Negative for dysuria. Musculoskeletal: Negative for back pain. Skin: Negative for rash. Neurological: Negative for headaches, focal weakness or numbness.  10-point ROS otherwise negative.  ____________________________________________   PHYSICAL EXAM:  VITAL SIGNS: ED Triage Vitals  Enc Vitals Group     BP 02/11/16 1035 155/84     Pulse Rate 02/11/16 1035 76  Resp 02/11/16 1035 18     Temp 02/11/16 1035 97.7 F (36.5 C)     Temp Source 02/11/16 1035 Oral     SpO2 02/11/16 1035 100 %     Weight 02/11/16 1035 132 lb (59.9 kg)     Height 02/11/16 1035 5\' 7"  (1.702 m)     Pain Score 02/11/16 1031 0   Constitutional: Alert and oriented. Well appearing and in no acute  distress. Eyes: Conjunctivae are normal. PERRL. Head: Atraumatic. Nose: No congestion/rhinnorhea. Mouth/Throat: Mucous membranes are moist.  Oropharynx non-erythematous. Neck: No stridor.  Cardiovascular: Normal rate, regular rhythm. Good peripheral circulation. Grossly normal heart sounds.   Respiratory: Normal respiratory effort.  No retractions. Lungs CTAB. Gastrointestinal: Soft and nontender. No distention.  Musculoskeletal: No lower extremity tenderness nor edema. No gross deformities of extremities. Neurologic:  Normal speech and language. No gross focal neurologic deficits are appreciated.  Skin:  Skin is warm, dry and intact. No rash noted. Psychiatric: Mood and affect are normal. Speech and behavior are normal.  ____________________________________________   LABS (all labs ordered are listed, but only abnormal results are displayed)  Labs Reviewed  CBC WITH DIFFERENTIAL/PLATELET - Abnormal; Notable for the following:       Result Value   WBC 3.8 (*)    RBC 3.76 (*)    All other components within normal limits  URINALYSIS, ROUTINE W REFLEX MICROSCOPIC (NOT AT [facility]) - Abnormal; Notable for the following:    Specific Gravity, Urine 1.003 (*)    All other components within normal limits  BASIC METABOLIC PANEL - Abnormal; Notable for the following:    Glucose, Bld 60 (*)    Calcium 8.5 (*)    All other components within normal limits  CBG MONITORING, ED   ____________________________________________   PROCEDURES  Procedure(s) performed:   Procedures  None ____________________________________________   INITIAL IMPRESSION / ASSESSMENT AND PLAN / ED COURSE  Pertinent labs & imaging results that were available during my care of the patient were reviewed by me and considered in my medical decision making (see chart for details).  Patient was seen in the emergency department for evaluation of generalized weakness. These symptoms seem to be relaxing and remitting. No  unilateral deficits on my exam. The patient appears well-hydrated clinically. Plan for IV fluids and repeat labs. Recent chemistry shows normal sodium. She is following with an endocrinologist later this week.   Repeat sodium is normal. The patient did have mild hypoglycemia. She did not eat breakfast this morning. She is not taking any medications that should affect her glucose levels. Kidney function normal and labs. A clear evidence to suggest infection. Weakness is generalized and not consistent with stroke. No chest pain, nausea, palpitations, dyspnea to suggest atypical ACS presentation.  The patient will follow with her primary care physician and endocrinologist as needed. Discussed eating several small holes during the day to maintain blood sugar levels. Discussed return precautions in detail.  At this time, I do not feel there is any life-threatening condition present. I have reviewed and discussed all results (EKG, imaging, lab, urine as appropriate), exam findings with patient. I have reviewed nursing notes and appropriate previous records.  I feel the patient is safe to be discharged home without further emergent workup. Discussed usual and customary return precautions. Patient and family (if present) verbalize understanding and are comfortable with this plan.  Patient will follow-up with their primary care provider. If they do not have a primary care provider, information for  follow-up has been provided to them. All questions have been answered.  ____________________________________________  FINAL CLINICAL IMPRESSION(S) / ED DIAGNOSES  Final diagnoses:  Generalized weakness     MEDICATIONS GIVEN DURING THIS VISIT:  Medications  sodium chloride 0.9 % bolus 1,000 mL (0 mLs Intravenous Stopped 02/11/16 1233)     NEW OUTPATIENT MEDICATIONS STARTED DURING THIS VISIT:  None   Note:  This document was prepared using Dragon voice recognition software and may include unintentional  dictation errors.  Nanda Quinton, MD Emergency Medicine   Margette Fast, MD 02/11/16 [phone]

## 2016-02-11 NOTE — Telephone Encounter (Signed)
noted 

## 2016-02-11 NOTE — ED Triage Notes (Signed)
Pt reports that she has been at [facility] for dehydration and low sodium.  Reports that she was seen at [facility] for same.  Pt reports that she has an appointment with her PCP today but is going to cancel it.  Pt requesting a full lab panel to determine cause of low sodium.

## 2016-02-11 NOTE — Telephone Encounter (Signed)
fyi Pt cancelled her appt today due to in hospital in [place]

## 2016-02-11 NOTE — Discharge Instructions (Signed)
You were seen in the ED today with generalized weakness. We found your sodium levels to be normal but your blood sugar with low. Be sure to eat plenty of small meals days to keep blood sugar normal. Follow with your PCP in the coming 1-2 days for repeat evaluation.  Return to the ED with any chest pain, difficulty breathing, sudden severe headache, or weakness on one side of your body or the other.

## 2016-02-12 DIAGNOSIS — H66003 Acute suppurative otitis media without spontaneous rupture of ear drum, bilateral: Secondary | ICD-10-CM | POA: Diagnosis not present

## 2016-02-12 DIAGNOSIS — J0141 Acute recurrent pansinusitis: Secondary | ICD-10-CM | POA: Diagnosis not present

## 2016-02-19 DIAGNOSIS — M797 Fibromyalgia: Secondary | ICD-10-CM | POA: Diagnosis not present

## 2016-02-19 DIAGNOSIS — J324 Chronic pansinusitis: Secondary | ICD-10-CM | POA: Diagnosis not present

## 2016-02-19 DIAGNOSIS — J310 Chronic rhinitis: Secondary | ICD-10-CM | POA: Diagnosis not present

## 2016-02-19 DIAGNOSIS — R5383 Other fatigue: Secondary | ICD-10-CM | POA: Diagnosis not present

## 2016-02-19 DIAGNOSIS — Z77120 Contact with and (suspected) exposure to mold (toxic): Secondary | ICD-10-CM | POA: Diagnosis not present

## 2016-02-19 DIAGNOSIS — G479 Sleep disorder, unspecified: Secondary | ICD-10-CM | POA: Diagnosis not present

## 2016-02-19 DIAGNOSIS — M799 Soft tissue disorder, unspecified: Secondary | ICD-10-CM | POA: Diagnosis not present

## 2016-02-20 ENCOUNTER — Other Ambulatory Visit: Payer: Self-pay | Admitting: Endocrinology

## 2016-02-20 DIAGNOSIS — E049 Nontoxic goiter, unspecified: Secondary | ICD-10-CM

## 2016-02-20 DIAGNOSIS — E871 Hypo-osmolality and hyponatremia: Secondary | ICD-10-CM | POA: Diagnosis not present

## 2016-02-22 ENCOUNTER — Encounter (HOSPITAL_BASED_OUTPATIENT_CLINIC_OR_DEPARTMENT_OTHER): Payer: Self-pay | Admitting: *Deleted

## 2016-02-22 ENCOUNTER — Emergency Department (HOSPITAL_BASED_OUTPATIENT_CLINIC_OR_DEPARTMENT_OTHER)
Admission: EM | Admit: 2016-02-22 | Discharge: 2016-02-22 | Disposition: A | Discharge status: Home / Self Care | Payer: Medicare HMO | Attending: Emergency Medicine | Admitting: Emergency Medicine

## 2016-02-22 ENCOUNTER — Ambulatory Visit: Payer: Medicare HMO | Admitting: Family Medicine

## 2016-02-22 DIAGNOSIS — R42 Dizziness and giddiness: Secondary | ICD-10-CM | POA: Diagnosis not present

## 2016-02-22 DIAGNOSIS — H538 Other visual disturbances: Secondary | ICD-10-CM | POA: Diagnosis not present

## 2016-02-22 DIAGNOSIS — R002 Palpitations: Secondary | ICD-10-CM | POA: Insufficient documentation

## 2016-02-22 DIAGNOSIS — I10 Essential (primary) hypertension: Secondary | ICD-10-CM | POA: Insufficient documentation

## 2016-02-22 DIAGNOSIS — R51 Headache: Secondary | ICD-10-CM | POA: Insufficient documentation

## 2016-02-22 DIAGNOSIS — H539 Unspecified visual disturbance: Secondary | ICD-10-CM

## 2016-02-22 DIAGNOSIS — E871 Hypo-osmolality and hyponatremia: Secondary | ICD-10-CM | POA: Diagnosis not present

## 2016-02-22 LAB — CBC WITH DIFFERENTIAL/PLATELET
Basophils Absolute: 0 10*3/uL (ref 0.0–0.1)
Basophils Relative: 1 %
EOS PCT: 4 %
Eosinophils Absolute: 0.3 10*3/uL (ref 0.0–0.7)
HCT: 38.6 % (ref 36.0–46.0)
Hemoglobin: 13.2 g/dL (ref 12.0–15.0)
LYMPHS ABS: 1.8 10*3/uL (ref 0.7–4.0)
LYMPHS PCT: 31 %
MCH: 33.4 pg (ref 26.0–34.0)
MCHC: 34.2 g/dL (ref 30.0–36.0)
MCV: 97.7 fL (ref 78.0–100.0)
MONO ABS: 0.6 10*3/uL (ref 0.1–1.0)
Monocytes Relative: 10 %
Neutro Abs: 3.1 10*3/uL (ref 1.7–7.7)
Neutrophils Relative %: 54 %
PLATELETS: 226 10*3/uL (ref 150–400)
RBC: 3.95 MIL/uL (ref 3.87–5.11)
RDW: 11.8 % (ref 11.5–15.5)
WBC: 5.7 10*3/uL (ref 4.0–10.5)

## 2016-02-22 LAB — COMPREHENSIVE METABOLIC PANEL
ALT: 16 U/L (ref 14–54)
AST: 19 U/L (ref 15–41)
Albumin: 4.3 g/dL (ref 3.5–5.0)
Alkaline Phosphatase: 50 U/L (ref 38–126)
Anion gap: 8 (ref 5–15)
BUN: 12 mg/dL (ref 6–20)
CHLORIDE: 100 mmol/L — AB (ref 101–111)
CO2: 27 mmol/L (ref 22–32)
CREATININE: 0.75 mg/dL (ref 0.44–1.00)
Calcium: 9.4 mg/dL (ref 8.9–10.3)
Glucose, Bld: 100 mg/dL — ABNORMAL HIGH (ref 65–99)
POTASSIUM: 3.4 mmol/L — AB (ref 3.5–5.1)
Sodium: 135 mmol/L (ref 135–145)
Total Bilirubin: 1 mg/dL (ref 0.3–1.2)
Total Protein: 7.6 g/dL (ref 6.5–8.1)

## 2016-02-22 NOTE — ED Notes (Addendum)
Pt presents with a hand written list of symptoms she has experienced multiple times over the past several months including flushing, palpitations, and HTN. Pt states today she had some vision changes which are new for her. Pt states it felt like she had a "lens covering both eyes" with dizziness. Pt states symptoms have subsided upon arrival. Pt states she feels fine now.

## 2016-02-22 NOTE — ED Notes (Signed)
Explained to pt that physician had ordered orthostatic VS, EKG, and urinalysis test to be completed. She refused all procedures to be completed stated that she has had all of the tests completed by other physicians and at [facility].

## 2016-02-22 NOTE — ED Provider Notes (Signed)
[facility] DEPT Provider Note   CSN: [id number] Arrival date & time: 02/22/16  1714   By signing my name below, I, Macon Large, attest that this documentation has been prepared under the direction and in the presence of Julianne Rice, MD. Electronically Signed: Macon Large, ED Scribe. 02/22/16. 7:55 PM.   History   Chief Complaint Chief Complaint  Patient presents with  . Visual Field Change    The history is provided by the patient. No language interpreter was used.    HPI Comments: Katie [name] is a 60 y.o. female with Hx of cataracts, who presents to the Emergency Department complaining of sudden, onset, visual change that occurred at 5 PM this afternoon. Pt states she noticed having a lens shadow in both her eyes, with the sensation of not being able to focus her vision. She notes she has not had a f/u with her optometetrist for several months. Pt states having similar reoccurring episodes for the past 3-4 years with no alleviating factors. She also reports having associated tachycardia, weakness, lightheadedness and HA with this episode after 5 minutes. Pt describes feeling flushed after these episodes which causes her blood pressure to spike to the high 200's. She denies CP, swelling, arthralgia and appetite change. 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 Per Pt, she notes being seen by an endocrinologist to monitor her thyroid levels.  Past Medical History:  Diagnosis Date  . Anxiety   . Fibromyalgia   . Hypertension   . Lyme disease   . Lyme disease     Patient Active Problem List   Diagnosis Date Noted  . Diarrhea 11/14/2015  . Acute gastroenteritis 11/14/2015  . Dehydration   . Enteritis 11/13/2015  . Hyponatremia 11/13/2015  . Elevated blood pressure 07/14/2013  . Lyme disease 10/25/2012  . Chronic fatigue syndrome 10/25/2012  . Anxiety and  depression 10/25/2012  . Fibromyalgia 10/25/2012  . Hormone replacement therapy - followed by Dr. Lonia Skinner in [place] and by [place] 10/25/2012    Past Surgical History:  Procedure Laterality Date  . Cataract Removal With Lens Implant Placement Left 03-06-2015    OB History    No data available       Home Medications    Prior to Admission medications   Medication Sig Start Date End Date Taking? Authorizing Provider  ALPRAZolam Duanne Moron) 0.5 MG tablet TAKE 1 TABLET BY MOUTH AS NEEDED FOR ANXIETY 03/02/16   Eulas Post, MD  b complex vitamins tablet Take 1 tablet by mouth daily. B-SUPREME    Historical Provider, MD  Cholecalciferol (VITAMIN D-3) 5000 UNITS TABS Take 5,000 tablets by mouth daily.    Historical Provider, MD  Coenzyme Q10 (CO Q10 PO) Take 400 mg by mouth daily.    Historical Provider, MD  CYANOCOBALAMIN-METHYLCOBALAMIN SL Inject 1 mL as directed every 7 (seven) days.     Historical Provider, MD  LEVOMEFOLIC ACID PO Take 1 capsule by mouth daily. L-5 MTHF    Historical Provider, MD  loperamide (IMODIUM A-D) 2 MG tablet Take 1 tablet (2 mg total) by mouth 4 (four) times daily as needed for diarrhea or loose stools. 11/14/15   Nishant Dhungel, MD  Misc. Devices (TOPICAL DISPENSER) MISC Apply 1 each topically daily. BI-EST 5 CREAM.  4 CLICKS DAILY (Biest (biestrogen) containing 20% estradiol and 80% estriol)    Historical Provider, MD  NALTREXONE HCL PO Take 1.5 mg by mouth daily.    Historical Provider, MD  Omega-3 Fatty Acids (FISH OIL PO) Take 2 capsules by mouth 2 (two) times daily. BARLEANS FISH OIL    Historical Provider, MD  OVER THE COUNTER MEDICATION Take 2 capsules by mouth daily. S-Acetyl Glutathione (SYNERGY)    Historical Provider, MD  OVER THE COUNTER MEDICATION Take 325 mg by mouth 2 (two) times daily. NATURAL CALM (magnesium carbonate and citric acid)-IONIC MAGNESIUM CITRATE    Historical Provider, MD  PROAIR HFA 108 (90 Base) MCG/ACT  inhaler Inhale 2 puffs into the lungs every 4 (four) hours as needed. For shortness of breath 10/11/15   Historical Provider, MD  Probiotic Product (PROBIOTIC ADVANCED PO) Take 1 capsule by mouth 2 (two) times daily. 100 BILLION    Historical Provider, MD  Progesterone Micronized (PROGESTERONE PO) Take 50 mg by mouth.    Historical Provider, MD  Testosterone 10 MG/ACT (2%) GEL Place 0.25 mLs onto the skin daily.    Historical Provider, MD    Family History Family History  Problem Relation Age of Onset  . Heart disease Mother     CHF  . Hypertension Mother   . Cancer Father 6    lung cancer  . Hypertension Paternal Grandmother   . Stroke Paternal Grandmother   . Hypertension Paternal Grandfather     Social History Social History  Substance Use Topics  . Smoking status: Never Smoker  . Smokeless tobacco: Never Used  . Alcohol use No     Comment: Ocaasional glass of wine     Allergies   Prednisone; Sulfa antibiotics; Sulfacetamide sodium; and Wheat bran   Review of Systems Review of Systems  Constitutional: Negative for chills and fever.  HENT: Negative for congestion, facial swelling and sore throat.   Eyes: Positive for visual disturbance. Negative for photophobia and pain.  Respiratory: Negative for cough and shortness of breath.   Cardiovascular: Positive for palpitations. Negative for chest pain and leg swelling.  Gastrointestinal: Negative for abdominal pain, nausea and vomiting.  Genitourinary: Negative for dysuria, flank pain and frequency.  Musculoskeletal: Negative for back pain and neck pain.  Skin: Negative for rash and wound.  Neurological: Positive for dizziness, light-headedness and headaches. Negative for weakness and numbness.  Psychiatric/Behavioral: The patient is nervous/anxious.   All other systems reviewed and are negative.    Physical Exam Updated Vital Signs BP 143/87 (BP Location: Right Arm)   Pulse 87   Temp 98.3 F (36.8 C) (Oral)   Resp  19   Ht 5\' 7"  (1.702 m)   Wt 132 lb (59.9 kg)   LMP 03/15/2013   SpO2 98%   BMI 20.67 kg/m   Physical Exam  Constitutional: She is oriented to person, place, and time. She appears well-developed and well-nourished.  HENT:  Head: Normocephalic and atraumatic.  Mouth/Throat: Oropharynx is clear and moist. No oropharyngeal exudate.  Eyes: EOM are normal. Pupils are equal, round, and reactive to light.  No nystagmus. Limited retinal exam but no abnormality appreciated.  Neck: Normal range of motion. Neck supple. No thyromegaly present.  No lymphadenopathy  Cardiovascular: Normal rate, regular rhythm and intact distal pulses.  Exam reveals no friction rub.   No murmur heard. Pulmonary/Chest: Effort normal and breath sounds normal. No respiratory distress. She has no wheezes. She has no rales. She exhibits no tenderness.  Abdominal: Soft. Bowel sounds are normal. There is no tenderness. There is no rebound and no guarding.  Musculoskeletal: Normal range of motion. She exhibits no edema or tenderness.  No lower extremity swelling or asymmetry. 2+ distal pulses in all extremities  Neurological: She is alert and oriented to person, place, and time.  Patient is alert and oriented x3 with clear, goal oriented speech. Patient has 5/5 motor in all extremities. Sensation is intact to light touch. Bilateral finger-to-nose is normal with no signs of dysmetria. Patient has a normal gait and walks without assistance.  Skin: Skin is warm and dry. Capillary refill takes less than 2 seconds. No rash noted. No erythema.  Psychiatric: She has a normal mood and affect. Her behavior is normal.  Nursing note and vitals reviewed.    ED Treatments / Results   DIAGNOSTIC STUDIES: Oxygen Saturation is 100% on RA, normal by my interpretation.    COORDINATION OF CARE: 7:37 PM Discussed treatment plan with pt at bedside which includes labs and urinalysis and pt agreed to plan.  Labs (all labs ordered are  listed, but only abnormal results are displayed) Labs Reviewed  COMPREHENSIVE METABOLIC PANEL - Abnormal; Notable for the following:       Result Value   Potassium 3.4 (*)    Chloride 100 (*)    Glucose, Bld 100 (*)    All other components within normal limits  CBC WITH DIFFERENTIAL/PLATELET    EKG  EKG Interpretation None       Radiology No results found.  Procedures Procedures (including critical care time)  Medications Ordered in ED Medications - No data to display   Initial Impression / Assessment and Plan / ED Course  I have reviewed the triage vital signs and the nursing notes.  Pertinent labs & imaging results that were available during my care of the patient were reviewed by me and considered in my medical decision making (see chart for details).  Clinical Course  Patient left prior to completion of full workup. She understands need to return for any worsening of her symptoms and to follow-up with her physician.  Final Clinical Impressions(s) / ED Diagnoses   Final diagnoses:  Vision changes  Palpitations    New Prescriptions Discharge Medication List as of 02/22/2016  8:12 PM     I personally performed the services described in this documentation, which was scribed in my presence. The recorded information has been reviewed and is accurate.       Julianne Rice, MD 02/29/16 [phone]

## 2016-02-22 NOTE — ED Notes (Signed)
MD at bedside. 

## 2016-02-22 NOTE — ED Triage Notes (Signed)
States she lost vision while driving earlier today. Headache afterward. She is being evaluated by an allergist in [place] for an elevated histamine level. She drove herself her and wants blood work for tryptase, beta prospa glandular and catecholmines.

## 2016-02-26 ENCOUNTER — Other Ambulatory Visit: Payer: Self-pay | Admitting: Family Medicine

## 2016-02-26 DIAGNOSIS — R5383 Other fatigue: Secondary | ICD-10-CM | POA: Diagnosis not present

## 2016-02-26 DIAGNOSIS — R509 Fever, unspecified: Secondary | ICD-10-CM | POA: Diagnosis not present

## 2016-02-26 DIAGNOSIS — D899 Disorder involving the immune mechanism, unspecified: Secondary | ICD-10-CM | POA: Diagnosis not present

## 2016-02-26 DIAGNOSIS — M797 Fibromyalgia: Secondary | ICD-10-CM | POA: Diagnosis not present

## 2016-02-26 DIAGNOSIS — J301 Allergic rhinitis due to pollen: Secondary | ICD-10-CM | POA: Diagnosis not present

## 2016-02-27 DIAGNOSIS — D3132 Benign neoplasm of left choroid: Secondary | ICD-10-CM | POA: Diagnosis not present

## 2016-02-27 NOTE — Telephone Encounter (Signed)
Due 03/02/2016.

## 2016-02-28 DIAGNOSIS — J302 Other seasonal allergic rhinitis: Secondary | ICD-10-CM | POA: Diagnosis not present

## 2016-03-04 DIAGNOSIS — R5383 Other fatigue: Secondary | ICD-10-CM | POA: Diagnosis not present

## 2016-03-04 DIAGNOSIS — M797 Fibromyalgia: Secondary | ICD-10-CM | POA: Diagnosis not present

## 2016-03-07 ENCOUNTER — Ambulatory Visit
Admission: RE | Admit: 2016-03-07 | Discharge: 2016-03-07 | Disposition: A | Discharge status: Home / Self Care | Payer: Medicare HMO | Source: Ambulatory Visit | Attending: Endocrinology | Admitting: Endocrinology

## 2016-03-07 DIAGNOSIS — E042 Nontoxic multinodular goiter: Secondary | ICD-10-CM | POA: Diagnosis not present

## 2016-03-07 DIAGNOSIS — E049 Nontoxic goiter, unspecified: Secondary | ICD-10-CM

## 2016-03-10 ENCOUNTER — Encounter (HOSPITAL_BASED_OUTPATIENT_CLINIC_OR_DEPARTMENT_OTHER): Payer: Self-pay | Admitting: *Deleted

## 2016-03-10 ENCOUNTER — Emergency Department (HOSPITAL_BASED_OUTPATIENT_CLINIC_OR_DEPARTMENT_OTHER)
Admission: EM | Admit: 2016-03-10 | Discharge: 2016-03-10 | Disposition: A | Discharge status: Home / Self Care | Payer: Medicare HMO | Attending: Emergency Medicine | Admitting: Emergency Medicine

## 2016-03-10 DIAGNOSIS — R531 Weakness: Secondary | ICD-10-CM

## 2016-03-10 DIAGNOSIS — R51 Headache: Secondary | ICD-10-CM | POA: Diagnosis present

## 2016-03-10 DIAGNOSIS — E871 Hypo-osmolality and hyponatremia: Secondary | ICD-10-CM

## 2016-03-10 DIAGNOSIS — Z79899 Other long term (current) drug therapy: Secondary | ICD-10-CM | POA: Insufficient documentation

## 2016-03-10 DIAGNOSIS — I10 Essential (primary) hypertension: Secondary | ICD-10-CM | POA: Insufficient documentation

## 2016-03-10 LAB — CBC WITH DIFFERENTIAL/PLATELET
BASOS ABS: 0 10*3/uL (ref 0.0–0.1)
Basophils Relative: 0 %
Eosinophils Absolute: 0.3 10*3/uL (ref 0.0–0.7)
Eosinophils Relative: 5 %
HEMATOCRIT: 36.3 % (ref 36.0–46.0)
HEMOGLOBIN: 12.1 g/dL (ref 12.0–15.0)
LYMPHS ABS: 1.3 10*3/uL (ref 0.7–4.0)
LYMPHS PCT: 24 %
MCH: 33.2 pg (ref 26.0–34.0)
MCHC: 33.3 g/dL (ref 30.0–36.0)
MCV: 99.5 fL (ref 78.0–100.0)
Monocytes Absolute: 0.4 10*3/uL (ref 0.1–1.0)
Monocytes Relative: 8 %
NEUTROS ABS: 3.2 10*3/uL (ref 1.7–7.7)
NEUTROS PCT: 63 %
PLATELETS: 207 10*3/uL (ref 150–400)
RBC: 3.65 MIL/uL — AB (ref 3.87–5.11)
RDW: 11.9 % (ref 11.5–15.5)
WBC: 5.2 10*3/uL (ref 4.0–10.5)

## 2016-03-10 LAB — BASIC METABOLIC PANEL
ANION GAP: 7 (ref 5–15)
BUN: 8 mg/dL (ref 6–20)
CHLORIDE: 94 mmol/L — AB (ref 101–111)
CO2: 29 mmol/L (ref 22–32)
Calcium: 8.4 mg/dL — ABNORMAL LOW (ref 8.9–10.3)
Creatinine, Ser: 0.63 mg/dL (ref 0.44–1.00)
GFR calc Af Amer: >60 mL/min (ref >60)
GLUCOSE: 147 mg/dL — AB (ref 65–99)
POTASSIUM: 3.3 mmol/L — AB (ref 3.5–5.1)
Sodium: 130 mmol/L — ABNORMAL LOW (ref 135–145)

## 2016-03-10 MED ORDER — SODIUM CHLORIDE 0.9 % IV SOLN
1000.0000 mL | Freq: Once | INTRAVENOUS | Status: AC
  Administered 2016-03-10: 1000 mL via INTRAVENOUS

## 2016-03-10 MED ORDER — SODIUM CHLORIDE 0.9 % IV BOLUS (SEPSIS)
1000.0000 mL | Freq: Once | INTRAVENOUS | Status: AC
  Administered 2016-03-10: 1000 mL via INTRAVENOUS

## 2016-03-10 MED ORDER — SODIUM CHLORIDE 0.9 % IV SOLN
1000.0000 mL | INTRAVENOUS | Status: DC
  Administered 2016-03-10: 1000 mL via INTRAVENOUS

## 2016-03-10 MED ORDER — KETOROLAC TROMETHAMINE 30 MG/ML IJ SOLN
30.0000 mg | Freq: Once | INTRAMUSCULAR | Status: AC
  Administered 2016-03-10: 30 mg via INTRAVENOUS
  Filled 2016-03-10: qty 1

## 2016-03-10 NOTE — ED Triage Notes (Signed)
Headache. States she has a hx of low sodium and dehydration in the past and feels that may be the cause of her headache.

## 2016-03-10 NOTE — ED Provider Notes (Signed)
[place] DEPT [facility] Provider Note   CSN: [id number] Arrival date & time: 03/10/16  1058     History   Chief Complaint Chief Complaint  Patient presents with  . Headache    HPI Katie [name] is a 60 y.o. female.  HPI Patient presents with generalized weakness and headache today.  States she feels off.  She has a history of hyponatremia and states this feels similar to her prior episodes.  She is being worked up by an endocrinologist for the cause of her hyponatremia.  She is also currently being managed by your nose and throat surgeon for allergies to molds is being worked up from a mast cell standpoint.  No recent illness.  Denies abdominal pain.  No chest pain shortness breath.  No other complaints at this time.  She feels that she's been eating and drinking normally.   Past Medical History:  Diagnosis Date  . Anxiety   . Fibromyalgia   . Hypertension   . Lyme disease   . Lyme disease     Patient Active Problem List   Diagnosis Date Noted  . Diarrhea 11/14/2015  . Acute gastroenteritis 11/14/2015  . Dehydration   . Enteritis 11/13/2015  . Hyponatremia 11/13/2015  . Elevated blood pressure 07/14/2013  . Lyme disease 10/25/2012  . Chronic fatigue syndrome 10/25/2012  . Anxiety and depression 10/25/2012  . Fibromyalgia 10/25/2012  . Hormone replacement therapy - followed by Dr. Lonia Skinner in gyn and by [place] 10/25/2012    Past Surgical History:  Procedure Laterality Date  . Cataract Removal With Lens Implant Placement Left 03-06-2015    OB History    No data available       Home Medications    Prior to Admission medications   Medication Sig Start Date End Date Taking? Authorizing Provider  ALPRAZolam Duanne Moron) 0.5 MG tablet TAKE 1 TABLET BY MOUTH AS NEEDED FOR ANXIETY 03/02/16   Eulas Post, MD  b complex vitamins tablet Take 1 tablet by mouth daily. B-SUPREME    Historical Provider, MD  Cholecalciferol (VITAMIN D-3) 5000  UNITS TABS Take 5,000 tablets by mouth daily.    Historical Provider, MD  Coenzyme Q10 (CO Q10 PO) Take 400 mg by mouth daily.    Historical Provider, MD  CYANOCOBALAMIN-METHYLCOBALAMIN SL Inject 1 mL as directed every 7 (seven) days.     Historical Provider, MD  LEVOMEFOLIC ACID PO Take 1 capsule by mouth daily. L-5 MTHF    Historical Provider, MD  loperamide (IMODIUM A-D) 2 MG tablet Take 1 tablet (2 mg total) by mouth 4 (four) times daily as needed for diarrhea or loose stools. 11/14/15   Nishant Dhungel, MD  Misc. Devices (TOPICAL DISPENSER) MISC Apply 1 each topically daily. BI-EST 5 CREAM.  4 CLICKS DAILY (Biest (biestrogen) containing 20% estradiol and 80% estriol)    Historical Provider, MD  NALTREXONE HCL PO Take 1.5 mg by mouth daily.    Historical Provider, MD  Omega-3 Fatty Acids (FISH OIL PO) Take 2 capsules by mouth 2 (two) times daily. BARLEANS FISH OIL    Historical Provider, MD  OVER THE COUNTER MEDICATION Take 2 capsules by mouth daily. S-Acetyl Glutathione (SYNERGY)    Historical Provider, MD  OVER THE COUNTER MEDICATION Take 325 mg by mouth 2 (two) times daily. NATURAL CALM (magnesium carbonate and citric acid)-IONIC MAGNESIUM CITRATE    Historical Provider, MD  PROAIR HFA 108 (90 Base) MCG/ACT inhaler Inhale 2 puffs into the lungs every  4 (four) hours as needed. For shortness of breath 10/11/15   Historical Provider, MD  Probiotic Product (PROBIOTIC ADVANCED PO) Take 1 capsule by mouth 2 (two) times daily. 100 BILLION    Historical Provider, MD  Progesterone Micronized (PROGESTERONE PO) Take 50 mg by mouth.    Historical Provider, MD  Testosterone 10 MG/ACT (2%) GEL Place 0.25 mLs onto the skin daily.    Historical Provider, MD    Family History Family History  Problem Relation Age of Onset  . Heart disease Mother     CHF  . Hypertension Mother   . Cancer Father 5    lung cancer  . Hypertension Paternal Grandmother   . Stroke Paternal Grandmother   . Hypertension Paternal  Grandfather     Social History Social History  Substance Use Topics  . Smoking status: Never Smoker  . Smokeless tobacco: Never Used  . Alcohol use No     Comment: Ocaasional glass of wine     Allergies   Prednisone; Sulfa antibiotics; Sulfacetamide sodium; and Wheat bran   Review of Systems Review of Systems  All other systems reviewed and are negative.    Physical Exam Updated Vital Signs BP 175/91 (BP Location: Left Arm)   Pulse 88   Resp 22   Ht 5\' 7"  (1.702 m)   Wt 132 lb (59.9 kg)   LMP 03/15/2013   SpO2 100%   BMI 20.67 kg/m   Physical Exam  Constitutional: She is oriented to person, place, and time. She appears well-developed and well-nourished. No distress.  HENT:  Head: Normocephalic and atraumatic.  Eyes: EOM are normal.  Neck: Normal range of motion.  Cardiovascular: Normal rate, regular rhythm and normal heart sounds.   Pulmonary/Chest: Effort normal and breath sounds normal.  Abdominal: Soft. She exhibits no distension. There is no tenderness.  Musculoskeletal: Normal range of motion.  Neurological: She is alert and oriented to person, place, and time.  Skin: Skin is warm and dry.  Psychiatric: She has a normal mood and affect. Judgment normal.  Nursing note and vitals reviewed.    ED Treatments / Results  Labs (all labs ordered are listed, but only abnormal results are displayed) Labs Reviewed  CBC WITH DIFFERENTIAL/PLATELET - Abnormal; Notable for the following:       Result Value   RBC 3.65 (*)    All other components within normal limits  BASIC METABOLIC PANEL - Abnormal; Notable for the following:    Sodium 130 (*)    Potassium 3.3 (*)    Chloride 94 (*)    Glucose, Bld 147 (*)    Calcium 8.4 (*)    All other components within normal limits    EKG  EKG Interpretation None       Radiology No results found.  Procedures Procedures (including critical care time)  Medications Ordered in ED Medications  0.9 %  sodium  chloride infusion (0 mLs Intravenous Stopped 03/10/16 1240)    Followed by  0.9 %  sodium chloride infusion (not administered)  ketorolac (TORADOL) 30 MG/ML injection 30 mg (30 mg Intravenous Given 03/10/16 1137)  sodium chloride 0.9 % bolus 1,000 mL (1,000 mLs Intravenous New Bag/Given 03/10/16 1242)     Initial Impression / Assessment and Plan / ED Course  I have reviewed the triage vital signs and the nursing notes.  Pertinent labs & imaging results that were available during my care of the patient were reviewed by me and considered in my medical decision  making (see chart for details).  Clinical Course     Hydrated in the emergency department.  Normal saline given.  She will need recheck of her sodium and 5 days.  Primary care follow-up.  Ongoing follow-up and management by endocrine regarding her recurrent hyponatremia.  No indication for additional workup at this time or admission for IV fluids.  Ambulatory in the ER.  She's feeling better this time  Final Clinical Impressions(s) / ED Diagnoses   Final diagnoses:  Weakness  Hyponatremia    New Prescriptions New Prescriptions   No medications on file     Jola Schmidt, MD 03/10/16 1311

## 2016-03-11 DIAGNOSIS — J3489 Other specified disorders of nose and nasal sinuses: Secondary | ICD-10-CM | POA: Diagnosis not present

## 2016-03-11 DIAGNOSIS — J324 Chronic pansinusitis: Secondary | ICD-10-CM | POA: Diagnosis not present

## 2016-03-14 DIAGNOSIS — Z779 Other contact with and (suspected) exposures hazardous to health: Secondary | ICD-10-CM | POA: Diagnosis not present

## 2016-03-14 DIAGNOSIS — Z01419 Encounter for gynecological examination (general) (routine) without abnormal findings: Secondary | ICD-10-CM | POA: Diagnosis not present

## 2016-03-14 DIAGNOSIS — Z124 Encounter for screening for malignant neoplasm of cervix: Secondary | ICD-10-CM | POA: Diagnosis not present

## 2016-03-22 ENCOUNTER — Emergency Department (HOSPITAL_BASED_OUTPATIENT_CLINIC_OR_DEPARTMENT_OTHER)
Admission: EM | Admit: 2016-03-22 | Discharge: 2016-03-22 | Disposition: A | Discharge status: Home / Self Care | Payer: Medicare HMO | Attending: Emergency Medicine | Admitting: Emergency Medicine

## 2016-03-22 ENCOUNTER — Encounter (HOSPITAL_BASED_OUTPATIENT_CLINIC_OR_DEPARTMENT_OTHER): Payer: Self-pay | Admitting: Emergency Medicine

## 2016-03-22 DIAGNOSIS — J0110 Acute frontal sinusitis, unspecified: Secondary | ICD-10-CM | POA: Diagnosis not present

## 2016-03-22 DIAGNOSIS — I10 Essential (primary) hypertension: Secondary | ICD-10-CM | POA: Insufficient documentation

## 2016-03-22 DIAGNOSIS — Z79899 Other long term (current) drug therapy: Secondary | ICD-10-CM | POA: Insufficient documentation

## 2016-03-22 DIAGNOSIS — R0981 Nasal congestion: Secondary | ICD-10-CM | POA: Diagnosis present

## 2016-03-22 LAB — CBC WITH DIFFERENTIAL/PLATELET
Basophils Absolute: 0 10*3/uL (ref 0.0–0.1)
Basophils Relative: 1 %
Eosinophils Absolute: 0.2 10*3/uL (ref 0.0–0.7)
Eosinophils Relative: 4 %
HEMATOCRIT: 40.5 % (ref 36.0–46.0)
Hemoglobin: 13.7 g/dL (ref 12.0–15.0)
LYMPHS PCT: 19 %
Lymphs Abs: 1.2 10*3/uL (ref 0.7–4.0)
MCH: 32.8 pg (ref 26.0–34.0)
MCHC: 33.8 g/dL (ref 30.0–36.0)
MCV: 96.9 fL (ref 78.0–100.0)
MONO ABS: 0.5 10*3/uL (ref 0.1–1.0)
MONOS PCT: 8 %
NEUTROS ABS: 4.5 10*3/uL (ref 1.7–7.7)
Neutrophils Relative %: 68 %
Platelets: 245 10*3/uL (ref 150–400)
RBC: 4.18 MIL/uL (ref 3.87–5.11)
RDW: 11.7 % (ref 11.5–15.5)
WBC: 6.5 10*3/uL (ref 4.0–10.5)

## 2016-03-22 LAB — BASIC METABOLIC PANEL
ANION GAP: 9 (ref 5–15)
BUN: 8 mg/dL (ref 6–20)
CALCIUM: 9.3 mg/dL (ref 8.9–10.3)
CO2: 25 mmol/L (ref 22–32)
Chloride: 99 mmol/L — ABNORMAL LOW (ref 101–111)
Creatinine, Ser: 0.67 mg/dL (ref 0.44–1.00)
GFR calc Af Amer: >60 mL/min (ref >60)
GFR calc non Af Amer: >60 mL/min (ref >60)
GLUCOSE: 104 mg/dL — AB (ref 65–99)
POTASSIUM: 3.6 mmol/L (ref 3.5–5.1)
Sodium: 133 mmol/L — ABNORMAL LOW (ref 135–145)

## 2016-03-22 LAB — URINALYSIS, ROUTINE W REFLEX MICROSCOPIC
Bilirubin Urine: NEGATIVE
GLUCOSE, UA: NEGATIVE mg/dL
KETONES UR: NEGATIVE mg/dL
LEUKOCYTES UA: NEGATIVE
Nitrite: NEGATIVE
PH: 6.5 (ref 5.0–8.0)
Protein, ur: NEGATIVE mg/dL
Specific Gravity, Urine: 1.002 — ABNORMAL LOW (ref 1.005–1.030)

## 2016-03-22 LAB — URINE MICROSCOPIC-ADD ON
Squamous Epithelial / LPF: NONE SEEN
WBC, UA: NONE SEEN WBC/hpf (ref 0–5)

## 2016-03-22 MED ORDER — SODIUM CHLORIDE 0.9 % IV BOLUS (SEPSIS)
1000.0000 mL | Freq: Once | INTRAVENOUS | Status: AC
  Administered 2016-03-22: 1000 mL via INTRAVENOUS

## 2016-03-22 MED ORDER — DIPHENHYDRAMINE HCL 50 MG/ML IJ SOLN
25.0000 mg | Freq: Once | INTRAMUSCULAR | Status: AC
  Administered 2016-03-22: 25 mg via INTRAVENOUS
  Filled 2016-03-22: qty 1

## 2016-03-22 MED ORDER — KETOROLAC TROMETHAMINE 30 MG/ML IJ SOLN
30.0000 mg | Freq: Once | INTRAMUSCULAR | Status: DC
Start: 2016-03-22 — End: 2016-03-22
  Filled 2016-03-22: qty 1

## 2016-03-22 MED ORDER — ONDANSETRON HCL 4 MG/2ML IJ SOLN
4.0000 mg | Freq: Once | INTRAMUSCULAR | Status: DC
  Filled 2016-03-22: qty 2

## 2016-03-22 NOTE — ED Provider Notes (Signed)
[place] DEPT [facility] Provider Note   CSN: [id number] Arrival date & time: 03/22/16  1323     History   Chief Complaint Chief Complaint  Patient presents with  . Headache  . Nasal Congestion  . Diarrhea    HPI GOPI GOODNOW is a 60 y.o. female.  HPI   DARINKA MARCELLO is a 60 y.o. female, with a history of Anxiety, fibromyalgia, chronic fatigue syndrome, hyponatremia, and hypertension, presenting to the ED with sinus congestion, sinus pressure, postnasal drip, sore throat, and headache beginning this morning. Patient also endorses headache is right sided, described as a pressure, rated 7/10, nonradiating. Patient states her symptoms feel similar to sinus infection she has had in the past.  States she has a history of hyponatremia, for which she is supposed to see an endocrinologist at [facility] in January. She has been on a high sodium diet for the past couple weeks. She says she wants to make sure her electrolytes are ok.   Patient denies fever/chills, difficulty breathing or swallowing, dizziness, neuro deficits, or any other complaints.  Past Medical History:  Diagnosis Date  . Anxiety   . Fibromyalgia   . Hypertension   . Hyponatremia   . Lyme disease   . Lyme disease     Patient Active Problem List   Diagnosis Date Noted  . Diarrhea 11/14/2015  . Acute gastroenteritis 11/14/2015  . Dehydration   . Enteritis 11/13/2015  . Hyponatremia 11/13/2015  . Elevated blood pressure 07/14/2013  . Lyme disease 10/25/2012  . Chronic fatigue syndrome 10/25/2012  . Anxiety and depression 10/25/2012  . Fibromyalgia 10/25/2012  . Hormone replacement therapy - followed by Dr. Lonia Skinner in gyn and by [place] 10/25/2012    Past Surgical History:  Procedure Laterality Date  . Cataract Removal With Lens Implant Placement Left 03-06-2015    OB History    No data available       Home Medications    Prior to Admission medications   Medication Sig  Start Date End Date Taking? Authorizing Provider  ALPRAZolam Duanne Moron) 0.5 MG tablet TAKE 1 TABLET BY MOUTH AS NEEDED FOR ANXIETY 03/02/16  Yes Eulas Post, MD  b complex vitamins tablet Take 1 tablet by mouth daily. B-SUPREME    Historical Provider, MD  Cholecalciferol (VITAMIN D-3) 5000 UNITS TABS Take 5,000 tablets by mouth daily.    Historical Provider, MD  Coenzyme Q10 (CO Q10 PO) Take 400 mg by mouth daily.    Historical Provider, MD  CYANOCOBALAMIN-METHYLCOBALAMIN SL Inject 1 mL as directed every 7 (seven) days.     Historical Provider, MD  LEVOMEFOLIC ACID PO Take 1 capsule by mouth daily. L-5 MTHF    Historical Provider, MD  loperamide (IMODIUM A-D) 2 MG tablet Take 1 tablet (2 mg total) by mouth 4 (four) times daily as needed for diarrhea or loose stools. 11/14/15   Nishant Dhungel, MD  Misc. Devices (TOPICAL DISPENSER) MISC Apply 1 each topically daily. BI-EST 5 CREAM.  4 CLICKS DAILY (Biest (biestrogen) containing 20% estradiol and 80% estriol)    Historical Provider, MD  NALTREXONE HCL PO Take 1.5 mg by mouth daily.    Historical Provider, MD  Omega-3 Fatty Acids (FISH OIL PO) Take 2 capsules by mouth 2 (two) times daily. BARLEANS FISH OIL    Historical Provider, MD  OVER THE COUNTER MEDICATION Take 2 capsules by mouth daily. S-Acetyl Glutathione (SYNERGY)    Historical Provider, MD  [place]  Take 325 mg by mouth 2 (two) times daily. NATURAL CALM (magnesium carbonate and citric acid)-IONIC MAGNESIUM CITRATE    Historical Provider, MD  PROAIR HFA 108 (90 Base) MCG/ACT inhaler Inhale 2 puffs into the lungs every 4 (four) hours as needed. For shortness of breath 10/11/15   Historical Provider, MD  Probiotic Product (PROBIOTIC ADVANCED PO) Take 1 capsule by mouth 2 (two) times daily. 100 BILLION    Historical Provider, MD  Progesterone Micronized (PROGESTERONE PO) Take 50 mg by mouth.    Historical Provider, MD  Testosterone 10 MG/ACT (2%) GEL Place 0.25 mLs onto the skin  daily.    Historical Provider, MD    Family History Family History  Problem Relation Age of Onset  . Heart disease Mother     CHF  . Hypertension Mother   . Cancer Father 4    lung cancer  . Hypertension Paternal Grandmother   . Stroke Paternal Grandmother   . Hypertension Paternal Grandfather     Social History Social History  Substance Use Topics  . Smoking status: Never Smoker  . Smokeless tobacco: Never Used  . Alcohol use Yes     Comment: Ocaasional glass of wine     Allergies   Prednisone; Sulfa antibiotics; Sulfacetamide sodium; and Wheat bran   Review of Systems Review of Systems  Constitutional: Negative for chills and fever.  HENT: Positive for congestion, postnasal drip, sinus pain, sinus pressure and sore throat. Negative for trouble swallowing and voice change.   Respiratory: Negative for shortness of breath.   Cardiovascular: Negative for chest pain.  Gastrointestinal: Negative for abdominal pain, constipation, diarrhea, nausea and vomiting.  Musculoskeletal: Negative for neck pain and neck stiffness.  Neurological: Positive for headaches. Negative for dizziness, syncope, weakness and numbness.  All other systems reviewed and are negative.    Physical Exam Updated Vital Signs BP 168/90 (BP Location: Left Arm)   Pulse 101   Temp 97.9 F (36.6 C) (Oral)   Resp 21   Ht 5\' 7"  (1.702 m)   Wt 59 kg   LMP 03/15/2013   SpO2 100%   BMI 20.36 kg/m   Physical Exam  Constitutional: She appears well-developed and well-nourished. No distress.  HENT:  Head: Normocephalic and atraumatic.    Eyes: Conjunctivae and EOM are normal. Pupils are equal, round, and reactive to light.  Neck: Normal range of motion. Neck supple.  Cardiovascular: Normal rate, regular rhythm, normal heart sounds and intact distal pulses.   Pulmonary/Chest: Effort normal and breath sounds normal. No respiratory distress.  Abdominal: Soft. There is no tenderness. There is no  guarding.  Musculoskeletal: She exhibits no edema.  Normal motor function intact in all extremities and spine.  Lymphadenopathy:    She has no cervical adenopathy.  Neurological: She is alert.  No sensory deficits. Strength 5/5 in all extremities. No gait disturbance. Coordination intact. Cranial nerves III-XII grossly intact. No facial droop.   Skin: Skin is warm and dry. She is not diaphoretic.  Psychiatric: She has a normal mood and affect. Her behavior is normal.  Nursing note and vitals reviewed.    ED Treatments / Results  Labs (all labs ordered are listed, but only abnormal results are displayed) Labs Reviewed  URINALYSIS, ROUTINE W REFLEX MICROSCOPIC (NOT AT [facility]) - Abnormal; Notable for the following:       Result Value   Specific Gravity, Urine 1.002 (*)    Hgb urine dipstick TRACE (*)    All other components within  normal limits  BASIC METABOLIC PANEL - Abnormal; Notable for the following:    Sodium 133 (*)    Chloride 99 (*)    Glucose, Bld 104 (*)    All other components within normal limits  URINE MICROSCOPIC-ADD ON - Abnormal; Notable for the following:    Bacteria, UA RARE (*)    All other components within normal limits  CBC WITH DIFFERENTIAL/PLATELET    EKG  EKG Interpretation None       Radiology No results found.  Procedures Procedures (including critical care time)  Medications Ordered in ED Medications  ketorolac (TORADOL) 30 MG/ML injection 30 mg (not administered)  ondansetron (ZOFRAN) injection 4 mg (not administered)  sodium chloride 0.9 % bolus 1,000 mL (1,000 mLs Intravenous New Bag/Given 03/22/16 1647)  diphenhydrAMINE (BENADRYL) injection 25 mg (25 mg Intravenous Given 03/22/16 1650)     Initial Impression / Assessment and Plan / ED Course  I have reviewed the triage vital signs and the nursing notes.  Pertinent labs & imaging results that were available during my care of the patient were reviewed by me and considered in my  medical decision making (see chart for details).  Clinical Course     Patient presents with sinus pressure, congestion, headache, and sore throat beginning this morning. Patient is nontoxic appearing, afebrile, not tachycardic on my evaluation, not tachypneic, not hypotensive, maintains SPO2 of 100% on room air, and is in no apparent distress. Patient has no signs of sepsis or other serious or life-threatening condition. Symptoms are consistent with sinusitis, most likely to be viral. The patient was given instructions for home care as well as return precautions. Patient voices understanding of these instructions, accepts the plan, and is comfortable with discharge.  Patient was offered Toradol for her discomfort. Zofran was offered due to the patient stating that she tends to have a strong reaction to any medications and might get nauseous. Patient accepted both of these. Patient then told the nurse that she would like to try some Benadryl first due to a recent history of eating "high histamine containing foods." Patient does not present with any symptoms of allergic reaction.  End of shift patient care handoff report given to Domenic Moras, PA-C. Plan: Finish IV fluids and d/c with PCP follow up.  Vitals:   03/22/16 1330 03/22/16 1606 03/22/16 1711  BP: 168/90 150/95 (!) 157/109  Pulse: 101 89 89  Resp: 21 20 24   Temp: 97.9 F (36.6 C)    TempSrc: Oral    SpO2: 100% 100% 100%  Weight: 59 kg    Height: 5\' 7"  (1.702 m)       Final Clinical Impressions(s) / ED Diagnoses   Final diagnoses:  Acute non-recurrent frontal sinusitis    New Prescriptions New Prescriptions   No medications on file     Lorayne Bender, PA-C 03/22/16 1726    Blanchie Dessert, MD 03/30/16 2036

## 2016-03-22 NOTE — ED Notes (Signed)
Pt requested trying Benadryl instead of previously ordered medications d/t her hx of "histamine issues." New order obtained.

## 2016-03-22 NOTE — ED Notes (Signed)
HEENT assessment, VS and blank note below CHARTED IN ERROR. DISREGARD.

## 2016-03-22 NOTE — Discharge Instructions (Signed)
Your symptoms are consistent with a viral illness. Viruses do not require antibiotics. Treatment is symptomatic care and it is important to note that these symptoms may last for 7-10 days. Get plenty of rest and stay well-hydrated. Ibuprofen, Naproxen, or Tylenol for pain or fever. Plain Mucinex may help relieve congestion. Warm liquids or Chloraseptic spray may help soothe a sore throat. Follow up with a primary care provider, as needed, for any future management of this issue.

## 2016-03-22 NOTE — ED Notes (Signed)
Pt sts she feels better after Benadryl

## 2016-03-22 NOTE — ED Notes (Signed)
Pt called out and said she was having difficulty swallowing; VS stable, pt is extremely anxious.

## 2016-03-22 NOTE — ED Triage Notes (Signed)
Pt c/o sinus congestion, headache and scratchy throat since this morning.  Pt took OTC cold medications and this afternoon states symptoms have worsened and pt now has diarrhea as well.

## 2016-03-26 ENCOUNTER — Ambulatory Visit: Payer: Medicare HMO | Admitting: Family Medicine

## 2016-04-01 DIAGNOSIS — J343 Hypertrophy of nasal turbinates: Secondary | ICD-10-CM | POA: Diagnosis not present

## 2016-04-01 DIAGNOSIS — J321 Chronic frontal sinusitis: Secondary | ICD-10-CM | POA: Diagnosis not present

## 2016-04-01 DIAGNOSIS — J342 Deviated nasal septum: Secondary | ICD-10-CM | POA: Diagnosis not present

## 2016-04-01 DIAGNOSIS — J324 Chronic pansinusitis: Secondary | ICD-10-CM | POA: Diagnosis not present

## 2016-04-02 ENCOUNTER — Other Ambulatory Visit: Payer: Self-pay | Admitting: Family Medicine

## 2016-04-03 NOTE — Telephone Encounter (Signed)
Last refill 03/02/2016 #30 Last OV 12-07-2015 Please advise

## 2016-04-04 NOTE — Telephone Encounter (Signed)
Refill once.  Set up office visit within next month to discuss.

## 2016-04-15 ENCOUNTER — Ambulatory Visit (INDEPENDENT_AMBULATORY_CARE_PROVIDER_SITE_OTHER): Payer: Medicare HMO | Admitting: Family Medicine

## 2016-04-15 VITALS — BP 122/90 | HR 98 | Temp 98.0°F | Ht 67.0 in | Wt 133.8 lb

## 2016-04-15 DIAGNOSIS — R7989 Other specified abnormal findings of blood chemistry: Secondary | ICD-10-CM

## 2016-04-15 DIAGNOSIS — E559 Vitamin D deficiency, unspecified: Secondary | ICD-10-CM

## 2016-04-15 DIAGNOSIS — E871 Hypo-osmolality and hyponatremia: Secondary | ICD-10-CM | POA: Diagnosis not present

## 2016-04-15 LAB — BASIC METABOLIC PANEL
BUN: 12 mg/dL (ref 6–23)
CALCIUM: 9.6 mg/dL (ref 8.4–10.5)
CHLORIDE: 101 meq/L (ref 96–112)
CO2: 31 meq/L (ref 19–32)
Creatinine, Ser: 1.13 mg/dL (ref 0.40–1.20)
GFR: 51.97 mL/min — ABNORMAL LOW (ref >60.00)
Glucose, Bld: 95 mg/dL (ref 70–99)
POTASSIUM: 4.3 meq/L (ref 3.5–5.1)
SODIUM: 138 meq/L (ref 135–145)

## 2016-04-15 LAB — VITAMIN D 25 HYDROXY (VIT D DEFICIENCY, FRACTURES): VITD: 71.53 ng/mL (ref 30.00–100.00)

## 2016-04-15 NOTE — Progress Notes (Signed)
Subjective:     Patient ID: Katie [name], female   DOB: 21-Feb-1956, 60 y.o.   MRN: [id number]  HPI Patient here requesting repeat sodium level. She's had some mild hyponatremia recently. She had a lab in November with sodium level on Nov 6th of 130 and on the 18th 133 . She has history of IBS and intermittent diarrhea. She apparently has been very restrictive of sodium intake and at the suggestion of the ENT recently started adding a teaspoon of sodium per day and feels much better overall. She does not take any thiazides. She has seen endocrinologist the past and states that they did ADH level which was normal. She has apparently been scheduled to see an endocrinologist at [facility] in January. No history of diabetes or other explanation for pseudohyponatremia. She does not take any serotonin reuptake inhibitors or other prescription medications that would likely be causing  History of low vitamin D. Requesting repeat levels. Currently takes 5000 to 10,000 international units once daily  Past Medical History:  Diagnosis Date  . Anxiety   . Fibromyalgia   . Hypertension   . Hyponatremia   . Lyme disease   . Lyme disease    Past Surgical History:  Procedure Laterality Date  . Cataract Removal With Lens Implant Placement Left 03-06-2015    reports that she has never smoked. She has never used smokeless tobacco. She reports that she drinks alcohol. She reports that she does not use drugs. family history includes Cancer (age of onset: 44) in her father; Heart disease in her mother; Hypertension in her mother, paternal grandfather, and paternal grandmother; Stroke in her paternal grandmother. Allergies  Allergen Reactions  . Azithromycin Diarrhea  . Prednisone Other (See Comments)    Was told not to use because of Lymes disease. States she also cannot use steroid drops.   . Sulfa Antibiotics     Caused white count to decrease  . Sulfacetamide Sodium     Caused white count to decrease  .  Wheat Bran Other (See Comments)     Review of Systems  Constitutional: Negative for chills, fatigue and fever.  Respiratory: Negative for shortness of breath.   Cardiovascular: Negative for chest pain.  Gastrointestinal: Negative for abdominal pain, nausea and vomiting.  Neurological: Negative for weakness and headaches.       Objective:   Physical Exam  Constitutional: She appears well-developed and well-nourished. No distress.  HENT:  Mouth/Throat: Oropharynx is clear and moist.  Neck: Neck supple.  Cardiovascular: Normal rate and regular rhythm.   Pulmonary/Chest: Effort normal and breath sounds normal. No respiratory distress. She has no wheezes. She has no rales.  Lymphadenopathy:    She has no cervical adenopathy.       Assessment:     #1 history of mild hyponatremia. Possible dietary related.  No thiazides or other medications that would be causing.  #2 history of low vitamin D    Plan:     -Recheck basic metabolic panel and 25 hydroxy vitamin D -Consider fractional excretion of sodium and further workup of hyponatremia if low sodium persists  Eulas Post MD [facility]

## 2016-04-15 NOTE — Patient Instructions (Signed)
Hyponatremia Introduction Hyponatremia is when the amount of salt (sodium) in your blood is too low. When sodium levels are low, your cells absorb extra water and they swell. The swelling happens throughout the body, but it mostly affects the brain. What are the causes? This condition may be caused by:  Heart, kidney, or liver problems.  Thyroid problems.  Adrenal gland problems.  Metabolic conditions, such as syndrome of inappropriate antidiuretic hormone (SIADH).  Severe vomiting and diarrhea.  Certain medicines or illegal drugs.  Dehydration.  Drinking too much water.  Eating a diet that is low in sodium.  Large burns on your body.  Sweating. What increases the risk? This condition is more likely to develop in people who:  Have long-term (chronic) kidney disease.  Have heart failure.  Have a medical condition that causes frequent or excessive diarrhea.  Have metabolic conditions, such as Addison disease or SIADH.  Take certain medicines that affect the sodium and fluid balance in the blood. Some of these medicine types include:  Diuretics.  NSAIDs.  Some opioid pain medicines.  Some antidepressants.  Some seizure prevention medicines. What are the signs or symptoms? Symptoms of this condition include:  Nausea and vomiting.  Confusion.  Lethargy.  Agitation.  Headache.  Seizures.  Unconsciousness.  Appetite loss.  Muscle weakness and cramping.  Feeling weak or light-headed.  Having a rapid heart rate.  Fainting, in severe cases. How is this diagnosed? This condition is diagnosed with a medical history and physical exam. You will also have other tests, including:  Blood tests.  Urine tests. How is this treated? Treatment for this condition depends on the cause. Treatment may include:  Fluids given through an IV tube that is inserted into one of your veins.  Medicines to correct the sodium imbalance. If medicines are causing the  condition, the medicines will need to be adjusted.  Limiting water or fluid intake to get the correct sodium balance. Follow these instructions at home:  Take medicines only as directed by your health care provider. Many medicines can make this condition worse. Talk with your health care provider about any medicines that you are currently taking.  Carefully follow a recommended diet as directed by your health care provider.  Carefully follow instructions from your health care provider about fluid restrictions.  Keep all follow-up visits as directed by your health care provider. This is important.  Do not drink alcohol. Contact a health care provider if:  You develop worsening nausea, fatigue, headache, confusion, or weakness.  Your symptoms go away and then return.  You have problems following the recommended diet. Get help right away if:  You have a seizure.  You faint.  You have ongoing diarrhea or vomiting. This information is not intended to replace advice given to you by your health care provider. Make sure you discuss any questions you have with your health care provider. Document Released: 04/11/2002 Document Revised: 09/27/2015 Document Reviewed: 05/11/2014  2017 Elsevier

## 2016-05-06 ENCOUNTER — Other Ambulatory Visit: Payer: Self-pay | Admitting: Family Medicine

## 2016-05-08 ENCOUNTER — Telehealth: Payer: Self-pay | Admitting: Family Medicine

## 2016-05-08 NOTE — Telephone Encounter (Signed)
° ° ° ° °  Pt request refill of the following:    ALPRAZolam (XANAX) 0.5 MG tablet   RX was sent to [facility] pt said do to insurance [facility]   Pham[facility]   [facility]   [id number]

## 2016-05-09 MED ORDER — ALPRAZOLAM 0.5 MG PO TABS: ORAL_TABLET | ORAL | 0 refills | Status: DC

## 2016-05-09 NOTE — Telephone Encounter (Signed)
Verbally called in for patient.  

## 2016-05-13 DIAGNOSIS — R5383 Other fatigue: Secondary | ICD-10-CM | POA: Diagnosis not present

## 2016-05-13 DIAGNOSIS — Z79899 Other long term (current) drug therapy: Secondary | ICD-10-CM | POA: Diagnosis not present

## 2016-05-13 DIAGNOSIS — E871 Hypo-osmolality and hyponatremia: Secondary | ICD-10-CM | POA: Diagnosis not present

## 2016-06-07 ENCOUNTER — Other Ambulatory Visit: Payer: Self-pay | Admitting: Family Medicine

## 2016-06-09 NOTE — Telephone Encounter (Signed)
Last refill 05-09-2016 #30, 0rf Last OV 04/15/2016 Please advise

## 2016-06-10 NOTE — Telephone Encounter (Signed)
This is listed as an "as needed" medication.  She is apparently take one daily.  May refill once but would like for her to taper off, as we had discussed previously.

## 2016-07-09 ENCOUNTER — Telehealth: Payer: Self-pay | Admitting: Family Medicine

## 2016-07-09 DIAGNOSIS — S92532G Displaced fracture of distal phalanx of left lesser toe(s), subsequent encounter for fracture with delayed healing: Secondary | ICD-10-CM | POA: Diagnosis not present

## 2016-07-09 NOTE — Telephone Encounter (Signed)
Last refill 06/10/16.  Office visit 04/15/16.  Okay to fill?

## 2016-07-09 NOTE — Telephone Encounter (Signed)
Pt state that she has on her L foot middle toe broken and they would like for her to be referred to podiatrist in [facility] or [place] area she has gone to a clinic while she is on vacation in [place] she has a copy of the xrays.  Pt will be back in town Monday 07/14/16.  Pt need new Rx for alprazolam and she is out.  Pharm:  [facility] [phone]

## 2016-07-10 NOTE — Telephone Encounter (Signed)
She has this listed as "prn" but has apparently been taking about one daily.  I feel it is in her best interest to get off benzodiazepines altogether.  I would be happy to see here to discuss with her .  Refill #15 only but I strongly advise that she not continue daily.  Would not recommened further refills. If she is battling with anxiety still offer f/u so we can discuss alternatives.

## 2016-07-11 MED ORDER — ALPRAZOLAM 0.5 MG PO TABS: ORAL_TABLET | ORAL | 0 refills | Status: DC

## 2016-07-11 NOTE — Telephone Encounter (Signed)
Patient is aware.  #15 with no refills called in.  Alprazolam taken off med list.

## 2016-07-16 NOTE — Telephone Encounter (Signed)
Patient is aware 

## 2016-07-16 NOTE — Telephone Encounter (Signed)
Pt had not heard about a referral to a pediatrist from message below. Due to broke toe. Pt prefers to see pediatrist in [place], and would like Dr Erick Blinks recommendation. Would like someone good. If Dr Elease Hashimoto doesn't know someone in [place], pt will consider [place] dr.

## 2016-07-16 NOTE — Telephone Encounter (Signed)
Dr Caffie Pinto is in [facility]

## 2016-07-19 ENCOUNTER — Emergency Department (HOSPITAL_BASED_OUTPATIENT_CLINIC_OR_DEPARTMENT_OTHER)
Admission: EM | Admit: 2016-07-19 | Discharge: 2016-07-19 | Disposition: A | Discharge status: Home / Self Care | Payer: Medicare HMO | Attending: Emergency Medicine | Admitting: Emergency Medicine

## 2016-07-19 ENCOUNTER — Encounter (HOSPITAL_BASED_OUTPATIENT_CLINIC_OR_DEPARTMENT_OTHER): Payer: Self-pay | Admitting: Emergency Medicine

## 2016-07-19 DIAGNOSIS — I10 Essential (primary) hypertension: Secondary | ICD-10-CM | POA: Insufficient documentation

## 2016-07-19 DIAGNOSIS — R531 Weakness: Secondary | ICD-10-CM | POA: Diagnosis not present

## 2016-07-19 DIAGNOSIS — E876 Hypokalemia: Secondary | ICD-10-CM | POA: Diagnosis not present

## 2016-07-19 LAB — CBC
HCT: 39.2 % (ref 36.0–46.0)
HEMOGLOBIN: 13.2 g/dL (ref 12.0–15.0)
MCH: 32.8 pg (ref 26.0–34.0)
MCHC: 33.7 g/dL (ref 30.0–36.0)
MCV: 97.5 fL (ref 78.0–100.0)
Platelets: 201 10*3/uL (ref 150–400)
RBC: 4.02 MIL/uL (ref 3.87–5.11)
RDW: 12.5 % (ref 11.5–15.5)
WBC: 4.2 10*3/uL (ref 4.0–10.5)

## 2016-07-19 LAB — COMPREHENSIVE METABOLIC PANEL
ALK PHOS: 69 U/L (ref 38–126)
ALT: 14 U/L (ref 14–54)
ANION GAP: 7 (ref 5–15)
AST: 19 U/L (ref 15–41)
Albumin: 3.9 g/dL (ref 3.5–5.0)
BUN: 12 mg/dL (ref 6–20)
CALCIUM: 8.9 mg/dL (ref 8.9–10.3)
CO2: 28 mmol/L (ref 22–32)
Chloride: 100 mmol/L — ABNORMAL LOW (ref 101–111)
Creatinine, Ser: 0.67 mg/dL (ref 0.44–1.00)
GFR calc Af Amer: >60 mL/min (ref >60)
GFR calc non Af Amer: >60 mL/min (ref >60)
Glucose, Bld: 114 mg/dL — ABNORMAL HIGH (ref 65–99)
POTASSIUM: 3.2 mmol/L — AB (ref 3.5–5.1)
Sodium: 135 mmol/L (ref 135–145)
Total Bilirubin: 1.7 mg/dL — ABNORMAL HIGH (ref 0.3–1.2)
Total Protein: 7.3 g/dL (ref 6.5–8.1)

## 2016-07-19 MED ORDER — POTASSIUM CHLORIDE CRYS ER 20 MEQ PO TBCR
40.0000 meq | EXTENDED_RELEASE_TABLET | Freq: Once | ORAL | Status: AC
  Administered 2016-07-19: 40 meq via ORAL
  Filled 2016-07-19: qty 2

## 2016-07-19 MED ORDER — SODIUM CHLORIDE 0.9 % IV BOLUS (SEPSIS)
500.0000 mL | Freq: Once | INTRAVENOUS | Status: AC
  Administered 2016-07-19: 500 mL via INTRAVENOUS

## 2016-07-19 MED ORDER — SODIUM CHLORIDE 0.9 % IV BOLUS (SEPSIS)
1000.0000 mL | Freq: Once | INTRAVENOUS | Status: AC
  Administered 2016-07-19: 1000 mL via INTRAVENOUS

## 2016-07-19 NOTE — ED Triage Notes (Addendum)
Pt reports generalized weakness and head fullness x 3 days. Pt started herself on an extra rx of amoxicillin that she had thinking she had a sinus infection with no improvement. Pt also reports high BP readings at home with no hx of HTN. Pt states her BP is typically high when her sodium is low.

## 2016-07-19 NOTE — ED Notes (Signed)
Pt ambulated to restroom without difficulty

## 2016-07-19 NOTE — Discharge Instructions (Signed)
Keep your scheduled appointment on Wednesday.  Return to ER for new or worsening symptoms, any additional concerns.

## 2016-07-19 NOTE — ED Provider Notes (Signed)
[facility] DEPT [facility] Provider Note   CSN: [id number] Arrival date & time: 07/19/16  7169     History   Chief Complaint Chief Complaint  Patient presents with  . Weakness    HPI Katie [name] is a 61 y.o. female.  The history is provided by the patient and medical records. No language interpreter was used.  Weakness  Associated symptoms include headaches ("head fullness" sinus pressure). Pertinent negatives include no shortness of breath, no chest pain and no vomiting.   Katie [name] is a 61 y.o. female  with a PMH as listed below who presents to the Emergency Department complaining of generalized fatigue x  3 days. Associated symptoms include nasal congestion and "head pressure". Patient states that her head feels similar to sinus infections she has had in the past. Per patient, she has a history of mast cell activation syndrome, therefore she gets sinus infections frequently. She had an rx for amoxil at home that was given to her "just in case" while she was traveling a few months ago. She started taking this yesterday and has taken four doses total (TID yesterday, one this morning) with little improvement of symptoms. She states that she took her BP at home and it has been running very high over the last two days. Per patient, fatigue and elevated BP often occurs when her sodium gets low or she gets very dehydrated. Patient states they just came back from vacation in [place], so she had a drastic change in the temperature (70's in [place], 30's in [place]) which she has been told can exacerbate her mast cell symptoms (sinus trouble, fatigue). She has an appointment scheduled with a specialist at [facility] on Wednesday (4 days from now). No fever or chills. No chest pain, shortness of breath, abdominal pain, nausea, vomiting, diarrhea, back pain, dysuria.  Past Medical History:  Diagnosis Date  . Anxiety   . Fibromyalgia   . Hypertension   . Hyponatremia   . Lyme disease   . Lyme disease      Patient Active Problem List   Diagnosis Date Noted  . Diarrhea 11/14/2015  . Acute gastroenteritis 11/14/2015  . Dehydration   . Enteritis 11/13/2015  . Hyponatremia 11/13/2015  . Elevated blood pressure 07/14/2013  . Lyme disease 10/25/2012  . Chronic fatigue syndrome 10/25/2012  . Anxiety and depression 10/25/2012  . Fibromyalgia 10/25/2012  . Hormone replacement therapy - followed by Dr. Lonia Skinner in gyn and by [place] 10/25/2012    Past Surgical History:  Procedure Laterality Date  . Cataract Removal With Lens Implant Placement Left 03-06-2015    OB History    No data available       Home Medications    Prior to Admission medications   Medication Sig Start Date End Date Taking? Authorizing Provider  cetirizine (ZYRTEC ALLERGY) 10 MG tablet Take 10 mg by mouth daily.   Yes Historical Provider, MD  ranitidine (ZANTAC) 150 MG capsule Take 150 mg by mouth 2 (two) times daily.   Yes Historical Provider, MD  b complex vitamins tablet Take 1 tablet by mouth daily. B-SUPREME    Historical Provider, MD  Cholecalciferol (VITAMIN D-3) 5000 UNITS TABS Take 5,000 tablets by mouth daily.    Historical Provider, MD  Coenzyme Q10 (CO Q10 PO) Take 400 mg by mouth daily.    Historical Provider, MD  CYANOCOBALAMIN-METHYLCOBALAMIN SL Inject 1 mL as directed every 7 (seven) days.     Historical Provider,  MD  folic acid (FOLVITE) 1 MG tablet Take 1 mg by mouth daily.    Historical Provider, MD  Nutritional Supplements (RESTORE-X PO) Take by mouth.    Historical Provider, MD  Omega-3 Fatty Acids (FISH OIL PO) Take 2 capsules by mouth 2 (two) times daily. BARLEANS FISH OIL    Historical Provider, MD  OVER THE COUNTER MEDICATION Take 2 capsules by mouth daily. S-Acetyl Glutathione (SYNERGY)    Historical Provider, MD  OVER THE COUNTER MEDICATION Take 325 mg by mouth 2 (two) times daily. NATURAL CALM (magnesium carbonate and citric acid)-IONIC MAGNESIUM CITRATE     Historical Provider, MD  Probiotic Product (PROBIOTIC ADVANCED PO) Take 1 capsule by mouth 2 (two) times daily. 100 BILLION    Historical Provider, MD    Family History Family History  Problem Relation Age of Onset  . Heart disease Mother     CHF  . Hypertension Mother   . Cancer Father 42    lung cancer  . Hypertension Paternal Grandmother   . Stroke Paternal Grandmother   . Hypertension Paternal Grandfather     Social History Social History  Substance Use Topics  . Smoking status: Never Smoker  . Smokeless tobacco: Never Used  . Alcohol use Yes     Comment: Ocaasional glass of wine     Allergies   Azithromycin; Prednisone; Sulfa antibiotics; Sulfacetamide sodium; and Wheat bran   Review of Systems Review of Systems  Constitutional: Negative for chills and fever.  HENT: Positive for congestion and sinus pressure.   Eyes: Negative for visual disturbance.  Respiratory: Negative for cough and shortness of breath.   Cardiovascular: Negative for chest pain.  Gastrointestinal: Negative for abdominal pain, nausea and vomiting.  Genitourinary: Negative for dysuria.  Musculoskeletal: Negative for back pain and neck pain.  Skin: Negative for rash.  Neurological: Positive for weakness and headaches ("head fullness" sinus pressure).     Physical Exam Updated Vital Signs BP (!) 149/95 (BP Location: Left Arm)   Pulse 80   Temp 98 F (36.7 C) (Oral)   Resp 18   Ht 5\' 7"  (1.702 m)   Wt 61.2 kg   LMP 03/15/2013   SpO2 98%   BMI 21.14 kg/m   Physical Exam  Constitutional: She is oriented to person, place, and time. She appears well-developed and well-nourished. No distress.  HENT:  Head: Normocephalic and atraumatic.  Mouth/Throat: Oropharynx is clear and moist.  Focal tenderness to right maxillary sinus and bilateral frontal sinuses (maxillary > frontal).   Neck: Normal range of motion. Neck supple.  Cardiovascular: Normal rate, regular rhythm and normal heart  sounds.   No murmur heard. Pulmonary/Chest: Effort normal and breath sounds normal. No respiratory distress. She has no wheezes. She has no rales.  Abdominal: Soft. She exhibits no distension. There is no tenderness.  Musculoskeletal: She exhibits no edema.  5/5 muscle strength in all four extremities. Full ROM.   Neurological: She is alert and oriented to person, place, and time.  Skin: Skin is warm and dry.  Nursing note and vitals reviewed.    ED Treatments / Results  Labs (all labs ordered are listed, but only abnormal results are displayed) Labs Reviewed  COMPREHENSIVE METABOLIC PANEL - Abnormal; Notable for the following:       Result Value   Potassium 3.2 (*)    Chloride 100 (*)    Glucose, Bld 114 (*)    Total Bilirubin 1.7 (*)    All other components within  normal limits  CBC    EKG  EKG Interpretation None       Radiology No results found.  Procedures Procedures (including critical care time)  Medications Ordered in ED Medications  sodium chloride 0.9 % bolus 1,000 mL (0 mLs Intravenous Stopped 07/19/16 1042)  potassium chloride SA (K-DUR,KLOR-CON) CR tablet 40 mEq (40 mEq Oral Given 07/19/16 1044)  sodium chloride 0.9 % bolus 500 mL (0 mLs Intravenous Stopped 07/19/16 1140)     Initial Impression / Assessment and Plan / ED Course  I have reviewed the triage vital signs and the nursing notes.  Pertinent labs & imaging results that were available during my care of the patient were reviewed by me and considered in my medical decision making (see chart for details).    Katie [name] is a 61 y.o. female who presents to ED for fatigue and sinus pressure. Patient states fatigue feels similar to when she has been hyponatremic in the past. Labs reviewed: sodium is normal today at 135. K+ od 3.2 which was replenished in ED.  Patient feels much better after IV fluids. She has ambulated in ED without difficulty and feels comfortable with discharge to home. BP  initially elevated, but improved to 149/95. Patient has follow up appointment on Wednesday where BP can be check at that time. Encouraged to keep scheduled appointment. Evaluation does not show pathology that would require ongoing emergent intervention or inpatient treatment. Return precautions discussed and all questions answered.    Final Clinical Impressions(s) / ED Diagnoses   Final diagnoses:  Weakness  Hypokalemia    New Prescriptions Discharge Medication List as of 07/19/2016 11:36 AM       [place], PA-C 07/19/16 1218    Fredia Sorrow, MD 07/21/16 1635

## 2016-07-21 ENCOUNTER — Emergency Department (HOSPITAL_BASED_OUTPATIENT_CLINIC_OR_DEPARTMENT_OTHER): Payer: Medicare HMO

## 2016-07-21 ENCOUNTER — Encounter (HOSPITAL_BASED_OUTPATIENT_CLINIC_OR_DEPARTMENT_OTHER): Payer: Self-pay

## 2016-07-21 ENCOUNTER — Emergency Department (HOSPITAL_BASED_OUTPATIENT_CLINIC_OR_DEPARTMENT_OTHER)
Admission: EM | Admit: 2016-07-21 | Discharge: 2016-07-21 | Disposition: A | Discharge status: Home / Self Care | Payer: Medicare HMO | Attending: Emergency Medicine | Admitting: Emergency Medicine

## 2016-07-21 DIAGNOSIS — I10 Essential (primary) hypertension: Secondary | ICD-10-CM | POA: Insufficient documentation

## 2016-07-21 DIAGNOSIS — R030 Elevated blood-pressure reading, without diagnosis of hypertension: Secondary | ICD-10-CM

## 2016-07-21 DIAGNOSIS — J3489 Other specified disorders of nose and nasal sinuses: Secondary | ICD-10-CM | POA: Diagnosis not present

## 2016-07-21 DIAGNOSIS — R079 Chest pain, unspecified: Secondary | ICD-10-CM | POA: Diagnosis not present

## 2016-07-21 DIAGNOSIS — E871 Hypo-osmolality and hyponatremia: Secondary | ICD-10-CM | POA: Insufficient documentation

## 2016-07-21 DIAGNOSIS — Z79899 Other long term (current) drug therapy: Secondary | ICD-10-CM | POA: Diagnosis not present

## 2016-07-21 DIAGNOSIS — R0602 Shortness of breath: Secondary | ICD-10-CM | POA: Diagnosis not present

## 2016-07-21 HISTORY — DX: Mast cell activation, unspecified: D89.40

## 2016-07-21 LAB — BASIC METABOLIC PANEL
Anion gap: 10 (ref 5–15)
BUN: 10 mg/dL (ref 6–20)
CALCIUM: 9.6 mg/dL (ref 8.9–10.3)
CO2: 26 mmol/L (ref 22–32)
Chloride: 97 mmol/L — ABNORMAL LOW (ref 101–111)
Creatinine, Ser: 0.72 mg/dL (ref 0.44–1.00)
GFR calc Af Amer: >60 mL/min (ref >60)
GFR calc non Af Amer: >60 mL/min (ref >60)
GLUCOSE: 114 mg/dL — AB (ref 65–99)
Potassium: 3.6 mmol/L (ref 3.5–5.1)
Sodium: 133 mmol/L — ABNORMAL LOW (ref 135–145)

## 2016-07-21 LAB — CBC
HEMATOCRIT: 40.4 % (ref 36.0–46.0)
Hemoglobin: 13.6 g/dL (ref 12.0–15.0)
MCH: 32.8 pg (ref 26.0–34.0)
MCHC: 33.7 g/dL (ref 30.0–36.0)
MCV: 97.3 fL (ref 78.0–100.0)
Platelets: 239 10*3/uL (ref 150–400)
RBC: 4.15 MIL/uL (ref 3.87–5.11)
RDW: 12.3 % (ref 11.5–15.5)
WBC: 5.4 10*3/uL (ref 4.0–10.5)

## 2016-07-21 LAB — TROPONIN I: Troponin I: <0.03 ng/mL (ref <0.03)

## 2016-07-21 MED ORDER — SODIUM CHLORIDE 0.9 % IV BOLUS (SEPSIS)
1000.0000 mL | Freq: Once | INTRAVENOUS | Status: AC
  Administered 2016-07-21: 1000 mL via INTRAVENOUS

## 2016-07-21 MED ORDER — KETOROLAC TROMETHAMINE 15 MG/ML IJ SOLN
15.0000 mg | Freq: Once | INTRAMUSCULAR | Status: AC
  Administered 2016-07-21: 15 mg via INTRAVENOUS
  Filled 2016-07-21: qty 1

## 2016-07-21 NOTE — ED Notes (Signed)
Pt taken to restroom.

## 2016-07-21 NOTE — ED Notes (Signed)
Pt states chest pressure has gone away at this time.

## 2016-07-21 NOTE — ED Provider Notes (Signed)
[facility] DEPT [facility] Provider Note   CSN: [id number] Arrival date & time: 07/21/16  1559   By signing my name below, I, Avnee Patel, attest that this documentation has been prepared under the direction and in the presence of  [employer]. Electronically Signed: Delton Prairie, ED Scribe. 07/21/16. 5:23 PM.   History   Chief Complaint Chief Complaint  Patient presents with  . Shortness of Breath   The history is provided by the patient. No language interpreter was used.   HPI Comments:  Katie [name] is a 61 y.o. female, with a PMHx of anxiety, HTN, hyponatremia and mast cell activation syndrome with frequent sinus infections, who presents to the Emergency Department complaining of acute onset SOB x 3 days which worsened today. Pt also reports lightheadedness, nausea, headache, chest pressure and elevated blood pressure (183/110 at 4 PM today). She notes she has been taking amoxicillin for a sinus infection (last dose at 4 AM today) and thinks this medication worsened her symptoms. Pt states she has taken Zantac, Mucinex and Zyrtec with no relief. She notes she visited the ED on 07/19/2016 and was given fluids which improved her symptoms. Pt denies slurred speech, facial droop, weakness, gait problem or any other associated symptoms. She also denies a hx of blood clots, recent surgeries, estrogen/hormone use or recent long distance travel. Pt has an appoitment with a specialist on 07/23/2016. No other complaints noted.   Past Medical History:  Diagnosis Date  . Anxiety   . Fibromyalgia   . Hypertension   . Hyponatremia   . Lyme disease   . Lyme disease   . Mast cell activation syndrome [facility])     Patient Active Problem List   Diagnosis Date Noted  . Diarrhea 11/14/2015  . Acute gastroenteritis 11/14/2015  . Dehydration   . Enteritis 11/13/2015  . Hyponatremia 11/13/2015  . Elevated blood pressure 07/14/2013  . Lyme disease 10/25/2012  . Chronic fatigue syndrome 10/25/2012   . Anxiety and depression 10/25/2012  . Fibromyalgia 10/25/2012  . Hormone replacement therapy - followed by Dr. Lonia Skinner in gyn and by [place] 10/25/2012    Past Surgical History:  Procedure Laterality Date  . Cataract Removal With Lens Implant Placement Left 03-06-2015    OB History    No data available       Home Medications    Prior to Admission medications   Medication Sig Start Date End Date Taking? Authorizing Provider  b complex vitamins tablet Take 1 tablet by mouth daily. B-SUPREME    Historical Provider, MD  cetirizine (ZYRTEC ALLERGY) 10 MG tablet Take 10 mg by mouth daily.    Historical Provider, MD  Cholecalciferol (VITAMIN D-3) 5000 UNITS TABS Take 5,000 tablets by mouth daily.    Historical Provider, MD  Coenzyme Q10 (CO Q10 PO) Take 400 mg by mouth daily.    Historical Provider, MD  CYANOCOBALAMIN-METHYLCOBALAMIN SL Inject 1 mL as directed every 7 (seven) days.     Historical Provider, MD  folic acid (FOLVITE) 1 MG tablet Take 1 mg by mouth daily.    Historical Provider, MD  Nutritional Supplements (RESTORE-X PO) Take by mouth.    Historical Provider, MD  Omega-3 Fatty Acids (FISH OIL PO) Take 2 capsules by mouth 2 (two) times daily. BARLEANS FISH OIL    Historical Provider, MD  OVER THE COUNTER MEDICATION Take 2 capsules by mouth daily. S-Acetyl Glutathione (SYNERGY)    Historical Provider, MD  OVER THE COUNTER  MEDICATION Take 325 mg by mouth 2 (two) times daily. NATURAL CALM (magnesium carbonate and citric acid)-IONIC MAGNESIUM CITRATE    Historical Provider, MD  Probiotic Product (PROBIOTIC ADVANCED PO) Take 1 capsule by mouth 2 (two) times daily. 100 BILLION    Historical Provider, MD  ranitidine (ZANTAC) 150 MG capsule Take 150 mg by mouth 2 (two) times daily.    Historical Provider, MD    Family History Family History  Problem Relation Age of Onset  . Heart disease Mother     CHF  . Hypertension Mother   . Cancer Father 71     lung cancer  . Hypertension Paternal Grandmother   . Stroke Paternal Grandmother   . Hypertension Paternal Grandfather     Social History Social History  Substance Use Topics  . Smoking status: Never Smoker  . Smokeless tobacco: Never Used  . Alcohol use Yes     Comment: Ocaasional glass of wine     Allergies   Azithromycin; Prednisone; Sulfa antibiotics; Sulfacetamide sodium; and Wheat bran   Review of Systems Review of Systems  Constitutional: Negative for diaphoresis and fever.  HENT: Positive for congestion, sinus pain and sinus pressure.   Eyes: Negative for redness.  Respiratory: Positive for shortness of breath. Negative for cough.   Cardiovascular: Positive for chest pain (pressure). Negative for palpitations and leg swelling.  Gastrointestinal: Positive for nausea. Negative for abdominal pain and vomiting.  Genitourinary: Negative for dysuria.  Musculoskeletal: Negative for back pain, gait problem and neck pain.  Skin: Negative for rash.  Neurological: Positive for light-headedness and headaches. Negative for syncope, facial asymmetry, speech difficulty and weakness.  Psychiatric/Behavioral: The patient is not nervous/anxious.      Physical Exam Updated Vital Signs BP (!) 185/107   Pulse 87   Temp 98.1 F (36.7 C) (Oral)   Resp 13   Ht 5\' 7"  (1.702 m)   Wt 135 lb (61.2 kg)   LMP 03/15/2013   SpO2 99%   BMI 21.14 kg/m   Physical Exam  Constitutional: She is oriented to person, place, and time. She appears well-developed and well-nourished. No distress.  HENT:  Head: Normocephalic and atraumatic.  Nose: Mucosal edema present.  Mouth/Throat: Mucous membranes are dry.  Eyes: Conjunctivae are normal.  Neck: Trachea normal and normal range of motion. Neck supple. Normal carotid pulses and no JVD present. No muscular tenderness present. Carotid bruit is not present. No tracheal deviation present.  Cardiovascular: Normal rate, regular rhythm, S1 normal, S2  normal, normal heart sounds and intact distal pulses.  Exam reveals no decreased pulses.   No murmur heard. Pulmonary/Chest: Effort normal. No respiratory distress. She has no wheezes. She exhibits no tenderness.  Abdominal: Soft. Normal aorta and bowel sounds are normal. She exhibits no distension. There is no tenderness. There is no rebound and no guarding.  Musculoskeletal: Normal range of motion. She exhibits no edema or tenderness.  No clinical signs of DVT.   Neurological: She is alert and oriented to person, place, and time.  Skin: Skin is warm and dry. She is not diaphoretic. No cyanosis. No pallor.  Psychiatric: She has a normal mood and affect.  Nursing note and vitals reviewed.    ED Treatments / Results  DIAGNOSTIC STUDIES:  Oxygen Saturation is 99% on RA, normal by my interpretation.    COORDINATION OF CARE:  5:23 PM Will provide fluids. Discussed treatment plan with pt at bedside and pt agreed to plan.  Labs (all labs ordered are  listed, but only abnormal results are displayed) Labs Reviewed  BASIC METABOLIC PANEL - Abnormal; Notable for the following:       Result Value   Sodium 133 (*)    Chloride 97 (*)    Glucose, Bld 114 (*)    All other components within normal limits  CBC  TROPONIN I    EKG  EKG Interpretation  Date/Time:  Monday July 21 2016 16:03:11 EDT Ventricular Rate:  93 PR Interval:  162 QRS Duration: 84 QT Interval:  356 QTC Calculation: 442 R Axis:   78 Text Interpretation:  Normal sinus rhythm Septal infarct , age undetermined Abnormal ECG Confirmed by Hazle Coca [phone]) on 07/21/2016 4:06:55 PM       Radiology Dg Chest 2 View  Result Date: 07/21/2016 CLINICAL DATA:  Sudden onset of chest pain and shortness of breath. EXAM: CHEST  2 VIEW COMPARISON:  02/04/2016 FINDINGS: The heart size and mediastinal contours are within normal limits. Both lungs are clear. The visualized skeletal structures are unremarkable. IMPRESSION: Normal chest.  Electronically Signed   By: Lorriane Shire M.D.   On: 07/21/2016 16:53    Procedures Procedures (including critical care time)  Medications Ordered in ED Medications  sodium chloride 0.9 % bolus 1,000 mL (0 mLs Intravenous Stopped 07/21/16 1828)  ketorolac (TORADOL) 15 MG/ML injection 15 mg (15 mg Intravenous Given 07/21/16 1738)     Initial Impression / Assessment and Plan / ED Course  I have reviewed the triage vital signs and the nursing notes.  Pertinent labs & imaging results that were available during my care of the patient were reviewed by me and considered in my medical decision making (see chart for details).     Patient seen and examined.   Vital signs reviewed and are as follows: BP 138/82   Pulse 72   Temp 98.1 F (36.7 C) (Oral)   Resp 12   Ht 5\' 7"  (1.702 m)   Wt 61.2 kg   LMP 03/15/2013   SpO2 96%   BMI 21.14 kg/m   7:05 PM results reviewed with patient at bedside. She is encouraged to keep her upcoming appointment.  Patient urged to return with worsening symptoms or other concerns. Patient verbalized understanding and agrees with plan.    Final Clinical Impressions(s) / ED Diagnoses   Final diagnoses:  Sinus pressure  Elevated blood pressure reading  Hyponatremia   Patient with recent sinusitis symptoms, history of mast cell syndrome. Upcoming appointment with a specialist. Symptoms today are consistent with previous constellation of symptoms after she takes an antibiotic. This may be related to her underlying mast cell syndrome. Mild hyponatremia noted here, treated with IV fluids. Overall, patient is feeling better. She is particularly concerned over transient spikes in blood pressure, which is now normalized. She does seem somewhat anxious. Low suspicion for DVT/PE. Low suspicion for ACS. EKG reviewed as above. Troponin negative 1. Low risk heart score. Feel safe for discharge to home at this time with follow-up as above.  New Prescriptions New  Prescriptions   No medications on file  I personally performed the services described in this documentation, which was scribed in my presence. The recorded information has been reviewed and is accurate.     Carlisle Cater, PA-C 07/21/16 [place], MD 07/24/16 [phone]

## 2016-07-21 NOTE — ED Notes (Signed)
Pt made aware to return if symptoms worsen or if any life threatening symptoms occur.   

## 2016-07-21 NOTE — ED Triage Notes (Addendum)
Pt reports sudden onset of chest pain, shortness of breath, dizziness, lightheadedness. Pt reports she is on Amoxicillin, had recent low potassium/NA level, and is concerned because she has mass cell activation disorder and thinks this could be a reaction.

## 2016-07-21 NOTE — Discharge Instructions (Signed)
Please read and follow all provided instructions.  Your diagnoses today include:  1. Sinus pressure   2. Elevated blood pressure reading   3. Hyponatremia     Tests performed today include:  Blood counts and electrolytes - sodium 133  EKG and heart test - normal  Vital signs. See below for your results today.   Medications prescribed:   None  Take any prescribed medications only as directed.  Home care instructions:  Follow any educational materials contained in this packet.  Follow-up instructions: Please follow-up with your primary care provider in the next 3 days for further evaluation of your symptoms. Keep your appointment with the mast cell specialist for further management recommendations.   Return instructions:   Please return to the Emergency Department if you experience worsening symptoms.   Please return if you have any other emergent concerns.  Additional Information:  Your vital signs today were: BP 138/82    Pulse 72    Temp 98.1 F (36.7 C) (Oral)    Resp 12    Ht 5\' 7"  (1.702 m)    Wt 61.2 kg    LMP 03/15/2013    SpO2 96%    BMI 21.14 kg/m  If your blood pressure (BP) was elevated above 135/85 this visit, please have this repeated by your doctor within one month. --------------

## 2016-07-22 ENCOUNTER — Ambulatory Visit: Payer: Self-pay | Admitting: Podiatry

## 2016-07-23 DIAGNOSIS — I159 Secondary hypertension, unspecified: Secondary | ICD-10-CM | POA: Diagnosis not present

## 2016-07-23 DIAGNOSIS — R5383 Other fatigue: Secondary | ICD-10-CM | POA: Diagnosis not present

## 2016-07-23 DIAGNOSIS — R6510 Systemic inflammatory response syndrome (SIRS) of non-infectious origin without acute organ dysfunction: Secondary | ICD-10-CM | POA: Diagnosis not present

## 2016-07-23 DIAGNOSIS — D899 Disorder involving the immune mechanism, unspecified: Secondary | ICD-10-CM | POA: Diagnosis not present

## 2016-07-23 DIAGNOSIS — E233 Hypothalamic dysfunction, not elsewhere classified: Secondary | ICD-10-CM | POA: Diagnosis not present

## 2016-07-23 DIAGNOSIS — J329 Chronic sinusitis, unspecified: Secondary | ICD-10-CM | POA: Diagnosis not present

## 2016-07-23 DIAGNOSIS — R5382 Chronic fatigue, unspecified: Secondary | ICD-10-CM | POA: Diagnosis not present

## 2016-07-24 DIAGNOSIS — J3089 Other allergic rhinitis: Secondary | ICD-10-CM | POA: Diagnosis not present

## 2016-07-24 DIAGNOSIS — J019 Acute sinusitis, unspecified: Secondary | ICD-10-CM | POA: Diagnosis not present

## 2016-07-24 DIAGNOSIS — D8940 Mast cell activation, unspecified: Secondary | ICD-10-CM | POA: Diagnosis not present

## 2016-07-28 ENCOUNTER — Ambulatory Visit: Payer: Self-pay | Admitting: Podiatry

## 2016-07-30 DIAGNOSIS — Z79899 Other long term (current) drug therapy: Secondary | ICD-10-CM | POA: Diagnosis not present

## 2016-07-30 DIAGNOSIS — R350 Frequency of micturition: Secondary | ICD-10-CM | POA: Diagnosis not present

## 2016-07-30 DIAGNOSIS — E871 Hypo-osmolality and hyponatremia: Secondary | ICD-10-CM | POA: Diagnosis not present

## 2016-07-30 DIAGNOSIS — D899 Disorder involving the immune mechanism, unspecified: Secondary | ICD-10-CM | POA: Diagnosis not present

## 2016-07-30 DIAGNOSIS — R5382 Chronic fatigue, unspecified: Secondary | ICD-10-CM | POA: Diagnosis not present

## 2016-07-30 DIAGNOSIS — J329 Chronic sinusitis, unspecified: Secondary | ICD-10-CM | POA: Diagnosis not present

## 2016-08-07 DIAGNOSIS — J302 Other seasonal allergic rhinitis: Secondary | ICD-10-CM | POA: Diagnosis not present

## 2016-08-08 ENCOUNTER — Telehealth: Payer: Self-pay | Admitting: *Deleted

## 2016-08-08 NOTE — Telephone Encounter (Signed)
Patient requesting Alprazolam 0.5 mg #15.  Last refill 07/11/16 last office visit 04/15/16. Okay to fill?

## 2016-08-09 NOTE — Telephone Encounter (Signed)
We had discussed trying to get off this.  Offer follow up if she would like to discuss.  If she is having persistent/frequent anxiety we can discuss other, safer options.

## 2016-08-11 NOTE — Telephone Encounter (Signed)
Noted and note faxed to pharmacy

## 2016-08-12 ENCOUNTER — Ambulatory Visit (INDEPENDENT_AMBULATORY_CARE_PROVIDER_SITE_OTHER): Payer: Medicare HMO | Admitting: Family Medicine

## 2016-08-12 ENCOUNTER — Encounter: Payer: Self-pay | Admitting: Family Medicine

## 2016-08-12 ENCOUNTER — Encounter: Payer: Self-pay | Admitting: Podiatry

## 2016-08-12 ENCOUNTER — Ambulatory Visit (INDEPENDENT_AMBULATORY_CARE_PROVIDER_SITE_OTHER): Payer: Medicare HMO | Admitting: Podiatry

## 2016-08-12 VITALS — BP 130/80 | HR 86 | Temp 97.6°F | Wt 141.5 lb

## 2016-08-12 VITALS — Ht 67.0 in | Wt 141.0 lb

## 2016-08-12 DIAGNOSIS — M2042 Other hammer toe(s) (acquired), left foot: Secondary | ICD-10-CM | POA: Diagnosis not present

## 2016-08-12 DIAGNOSIS — S92513A Displaced fracture of proximal phalanx of unspecified lesser toe(s), initial encounter for closed fracture: Secondary | ICD-10-CM

## 2016-08-12 DIAGNOSIS — E871 Hypo-osmolality and hyponatremia: Secondary | ICD-10-CM | POA: Diagnosis not present

## 2016-08-12 DIAGNOSIS — J329 Chronic sinusitis, unspecified: Secondary | ICD-10-CM

## 2016-08-12 DIAGNOSIS — S99922A Unspecified injury of left foot, initial encounter: Secondary | ICD-10-CM

## 2016-08-12 DIAGNOSIS — D8940 Mast cell activation, unspecified: Secondary | ICD-10-CM | POA: Diagnosis not present

## 2016-08-12 LAB — BASIC METABOLIC PANEL
BUN: 7 mg/dL (ref 6–23)
CHLORIDE: 100 meq/L (ref 96–112)
CO2: 32 mEq/L (ref 19–32)
CREATININE: 0.64 mg/dL (ref 0.40–1.20)
Calcium: 9.3 mg/dL (ref 8.4–10.5)
GFR: 100.05 mL/min (ref >60.00)
GLUCOSE: 92 mg/dL (ref 70–99)
POTASSIUM: 4.2 meq/L (ref 3.5–5.1)
Sodium: 138 mEq/L (ref 135–145)

## 2016-08-12 MED ORDER — ALPRAZOLAM 0.5 MG PO TABS: 0.5000 mg | ORAL_TABLET | Freq: Three times a day (TID) | ORAL | 0 refills | Status: DC | PRN

## 2016-08-12 NOTE — Progress Notes (Signed)
SUBJECTIVE: 61 y.o. year old female presents complaining of painful toe 3rd left. Was told that the toe is fractured after been wearing Flip Flaps while in [place] in mid February 2018. After been 2 months since the incident, the is still hurt at times if on Flip Flaps.  REVIEW OF SYSTEMS: Pertinent items noted in HPI and remainder of comprehensive ROS otherwise negative.  OBJECTIVE: DERMATOLOGIC EXAMINATION: No abnormal findings.  VASCULAR EXAMINATION OF LOWER LIMBS: All pedal pulses are palpable with normal pulsation except right Dorsalis pedis artery that is not palpable.  Capillary Filling times within 3 seconds in all digits.  No edema or erythema noted. Temperature gradient from tibial crest to dorsum of foot is within normal bilateral.  NEUROLOGIC EXAMINATION OF THE LOWER LIMBS: All epicritic and tactile sensations grossly intact. Sharp and Dull discriminatory sensations at the plantar ball of hallux is intact bilateral.   MUSCULOSKELETAL EXAMINATION: Positive for severe digital contracture at DIPJ 3rd toe left with enlarged joint.   Patient brought her old X-ray that was taken in March 2018. Noted of transverse fracture of distal phalangeal base near the cartilage with distal displacement.  ASSESSMENT: Fractured phalangeal base, distal phalanx 3rd digit left. Severe contracted joint 3rd at DIPJ left. Pain at times.  PLAN: Reviewed clinical findings and available treatment options. Patient will return for possible surgical intervention if pain persist.

## 2016-08-12 NOTE — Progress Notes (Signed)
Subjective:     Patient ID: Katie [name], female   DOB: 04/02/1956, 61 y.o.   MRN: [id number]  HPI Patient is here to discuss medications. She is also requesting ENT referral. She has past medical history significant for fibromyalgia, reported Lyme disease, "chronic fatigue syndrome ", and anxiety and depression. She's had multiple episodes apparently over the past couple of years where her blood pressure increases and she is been to nursery him several times with these symptoms. She states that sinus infections tend to correlate with these episodes. He has seen a specialist in [facility] who is diagnosed her with dysautonomia and tachycardia prominent POTS.  She has episodes of hypertension and tachycardia and these tend to mimic panic attacks. She has responded well to IV hydration the past. She specifically has requested Xanax and states that this seems to help her blood pressure spots as well. There is been some question of mass cell activation syndrome and she was told by someone that Xanax seemed to offset that. She does not take this regularly.  Patient saw ENT last November and had CT the sinuses and reportedly had occluded sinuses including frontal and maxillary. She was advised to have surgery and basically wants a second opinion. She recently had flareup with sinusitis symptoms and was treated with amoxicillin and did not resolve and was switched Augmentin and improved after that.  Patient has history of mild hyponatremia in the past. She had recent sodium 133 during lab work through [place] visit. She is requesting repeat today. No thiazide use.  Past Medical History:  Diagnosis Date  . Anxiety   . Fibromyalgia   . Hypertension   . Hyponatremia   . Lyme disease   . Lyme disease   . Mast cell activation syndrome [facility])    Past Surgical History:  Procedure Laterality Date  . Cataract Removal With Lens Implant Placement Left 03-06-2015    reports that she has never smoked. She has never  used smokeless tobacco. She reports that she drinks alcohol. She reports that she does not use drugs. family history includes Cancer (age of onset: 57) in her father; Heart disease in her mother; Hypertension in her mother, paternal grandfather, and paternal grandmother; Stroke in her paternal grandmother. Allergies  Allergen Reactions  . Azithromycin Diarrhea  . Prednisone Other (See Comments)    Was told not to use because of Lymes disease. States she also cannot use steroid drops.   . Sulfa Antibiotics     Caused white count to decrease  . Sulfacetamide Sodium     Caused white count to decrease  . Wheat Bran Other (See Comments)     Review of Systems  Constitutional: Negative for fatigue.  HENT: Positive for congestion.   Eyes: Negative for visual disturbance.  Respiratory: Negative for cough, chest tightness, shortness of breath and wheezing.   Cardiovascular: Negative for chest pain, palpitations and leg swelling.  Neurological: Negative for dizziness, seizures, syncope, weakness, light-headedness and headaches.       Objective:   Physical Exam  Constitutional: She appears well-developed and well-nourished.  Eyes: Pupils are equal, round, and reactive to light.  Neck: Neck supple. No JVD present. No thyromegaly present.  Cardiovascular: Normal rate and regular rhythm.  Exam reveals no gallop.   Pulmonary/Chest: Effort normal and breath sounds normal. No respiratory distress. She has no wheezes. She has no rales.  Musculoskeletal: She exhibits no edema.  Neurological: She is alert.       Assessment:     #  1 history of recurrent/chronic sinusitis  #2 history of reported mast cell activation syndrome  #3 history of dysautonomia and tachycardia prominent POTS  #4 history of recurrent hyponatremia    Plan:     -Set up ENT referral -We discussed adverse issues with chronic benzo use and have strongly advocated that she get off alprazolam completely. She states that  this seems to be the only thing that helps during episodes of extreme elevation of blood pressure. We will consult with her physician in [place] to get their thoughts -Recheck basic metabolic panel  Eulas Post MD [facility]

## 2016-08-12 NOTE — Progress Notes (Signed)
Pre visit review using our clinic review tool, if applicable. No additional management support is needed unless otherwise documented below in the visit note. 

## 2016-08-12 NOTE — Patient Instructions (Signed)
Seen for painful toe 3rd left following an injury occurred in February 2018. Still painful at times. Noted of broken cartilage with severe contracture at the distal interphalangeal joint. May require surgical intervention in a future. Mean time may resume activity as tolerated. Return as needed.

## 2016-08-14 ENCOUNTER — Emergency Department (HOSPITAL_BASED_OUTPATIENT_CLINIC_OR_DEPARTMENT_OTHER)
Admission: EM | Admit: 2016-08-14 | Discharge: 2016-08-14 | Disposition: A | Discharge status: Home / Self Care | Payer: Medicare HMO | Attending: Emergency Medicine | Admitting: Emergency Medicine

## 2016-08-14 ENCOUNTER — Encounter (HOSPITAL_BASED_OUTPATIENT_CLINIC_OR_DEPARTMENT_OTHER): Payer: Self-pay | Admitting: *Deleted

## 2016-08-14 DIAGNOSIS — Z79899 Other long term (current) drug therapy: Secondary | ICD-10-CM | POA: Insufficient documentation

## 2016-08-14 DIAGNOSIS — E860 Dehydration: Secondary | ICD-10-CM | POA: Diagnosis not present

## 2016-08-14 DIAGNOSIS — I10 Essential (primary) hypertension: Secondary | ICD-10-CM | POA: Insufficient documentation

## 2016-08-14 DIAGNOSIS — R5383 Other fatigue: Secondary | ICD-10-CM | POA: Diagnosis present

## 2016-08-14 HISTORY — DX: Tachycardia, unspecified: R00.0

## 2016-08-14 HISTORY — DX: Orthostatic hypotension: I95.1

## 2016-08-14 HISTORY — DX: Postural orthostatic tachycardia syndrome (POTS): G90.A

## 2016-08-14 HISTORY — DX: Other specified cardiac arrhythmias: I49.8

## 2016-08-14 LAB — COMPREHENSIVE METABOLIC PANEL
ALBUMIN: 3.8 g/dL (ref 3.5–5.0)
ALK PHOS: 63 U/L (ref 38–126)
ALT: 14 U/L (ref 14–54)
AST: 21 U/L (ref 15–41)
Anion gap: 6 (ref 5–15)
BILIRUBIN TOTAL: 0.6 mg/dL (ref 0.3–1.2)
BUN: 12 mg/dL (ref 6–20)
CALCIUM: 9 mg/dL (ref 8.9–10.3)
CO2: 31 mmol/L (ref 22–32)
Chloride: 97 mmol/L — ABNORMAL LOW (ref 101–111)
Creatinine, Ser: 0.74 mg/dL (ref 0.44–1.00)
GLUCOSE: 99 mg/dL (ref 65–99)
POTASSIUM: 3.3 mmol/L — AB (ref 3.5–5.1)
Sodium: 134 mmol/L — ABNORMAL LOW (ref 135–145)
TOTAL PROTEIN: 6.9 g/dL (ref 6.5–8.1)

## 2016-08-14 LAB — CBC WITH DIFFERENTIAL/PLATELET
BASOS PCT: 0 %
Basophils Absolute: 0 10*3/uL (ref 0.0–0.1)
EOS ABS: 0.3 10*3/uL (ref 0.0–0.7)
Eosinophils Relative: 5 %
HEMATOCRIT: 36.3 % (ref 36.0–46.0)
Hemoglobin: 12.2 g/dL (ref 12.0–15.0)
LYMPHS ABS: 2.3 10*3/uL (ref 0.7–4.0)
LYMPHS PCT: 37 %
MCH: 33.2 pg (ref 26.0–34.0)
MCHC: 33.6 g/dL (ref 30.0–36.0)
MCV: 98.9 fL (ref 78.0–100.0)
MONO ABS: 0.5 10*3/uL (ref 0.1–1.0)
MONOS PCT: 8 %
NEUTROS ABS: 3.3 10*3/uL (ref 1.7–7.7)
NEUTROS PCT: 50 %
Platelets: 226 10*3/uL (ref 150–400)
RBC: 3.67 MIL/uL — ABNORMAL LOW (ref 3.87–5.11)
RDW: 12.4 % (ref 11.5–15.5)
WBC: 6.4 10*3/uL (ref 4.0–10.5)

## 2016-08-14 MED ORDER — SODIUM CHLORIDE 0.9 % IV BOLUS (SEPSIS)
1000.0000 mL | Freq: Once | INTRAVENOUS | Status: AC
  Administered 2016-08-14: 1000 mL via INTRAVENOUS

## 2016-08-14 MED ORDER — SODIUM CHLORIDE 0.9 % IV BOLUS (SEPSIS)
500.0000 mL | Freq: Once | INTRAVENOUS | Status: AC
  Administered 2016-08-14: 500 mL via INTRAVENOUS

## 2016-08-14 NOTE — ED Triage Notes (Signed)
Pt c/o weakness and dehydration, hx POTS

## 2016-08-14 NOTE — ED Provider Notes (Signed)
[facility] DEPT [facility] Provider Note   CSN: [id number] Arrival date & time: 08/14/16  1932  By signing my name below, I, Hansel Feinstein, attest that this documentation has been prepared under the direction and in the presence of Fatima Blank, MD. Electronically Signed: Hansel Feinstein, ED Scribe. 08/14/16. 9:05 PM.    History   Chief Complaint Chief Complaint  Patient presents with  . Dehydration    HPI Katie [name] is a 61 y.o. female with h/o POTS, mast cell activation syndrome who presents to the Emergency Department complaining of moderate, generalized fatigue that began 5 days ago. Pt notes she finished amoxicillin and Augmentin last week for a sinus infection. Per pt, her symptoms lasted 2 weeks, have currently resolved, but she has some residual sinus pressure. She reports frequent h/o similar symptoms secondary to POTS and mast cell activation. Pt states she has attempted to remain hydrated at home, but feels as though her symptoms are consistent with prior episodes of dehydration. She denies diarrhea, nausea, vomiting, fever, dysuria, dental pain.   The history is provided by the patient. No language interpreter was used.    Past Medical History:  Diagnosis Date  . Anxiety   . Fibromyalgia   . Hypertension   . Hyponatremia   . Lyme disease   . Lyme disease   . Mast cell activation syndrome ([place])   . POTS (postural orthostatic tachycardia syndrome)     Patient Active Problem List   Diagnosis Date Noted  . Diarrhea 11/14/2015  . Acute gastroenteritis 11/14/2015  . Dehydration   . Enteritis 11/13/2015  . Hyponatremia 11/13/2015  . Elevated blood pressure 07/14/2013  . Lyme disease 10/25/2012  . Chronic fatigue syndrome 10/25/2012  . Anxiety and depression 10/25/2012  . Fibromyalgia 10/25/2012  . Hormone replacement therapy - followed by Dr. Lonia Skinner in gyn and by [facility] 10/25/2012    Past Surgical History:  Procedure Laterality Date   . Cataract Removal With Lens Implant Placement Left 03-06-2015    OB History    No data available       Home Medications    Prior to Admission medications   Medication Sig Start Date End Date Taking? Authorizing Provider  b complex vitamins tablet Take 1 tablet by mouth daily. B-SUPREME    Historical Provider, MD  cetirizine (ZYRTEC ALLERGY) 10 MG tablet Take 10 mg by mouth daily.    Historical Provider, MD  Cholecalciferol (VITAMIN D-3) 5000 UNITS TABS Take 5,000 tablets by mouth daily.    Historical Provider, MD  Coenzyme Q10 (CO Q10 PO) Take 400 mg by mouth daily.    Historical Provider, MD  CYANOCOBALAMIN-METHYLCOBALAMIN SL Inject 1 mL as directed every 7 (seven) days.     Historical Provider, MD  folic acid (FOLVITE) 1 MG tablet Take 1 mg by mouth daily.    Historical Provider, MD  KETOTIFEN FUMARATE OP Apply to eye.    Historical Provider, MD  montelukast (SINGULAIR) 10 MG tablet Take 10 mg by mouth at bedtime.    Historical Provider, MD  Nutritional Supplements (RESTORE-X PO) Take by mouth.    Historical Provider, MD  Omega-3 Fatty Acids (FISH OIL PO) Take 2 capsules by mouth 2 (two) times daily. BARLEANS FISH OIL    Historical Provider, MD  OVER THE COUNTER MEDICATION Take 2 capsules by mouth daily. S-Acetyl Glutathione (SYNERGY)    Historical Provider, MD  OVER THE COUNTER MEDICATION Take 325 mg by mouth 2 (two) times daily.  NATURAL CALM (magnesium carbonate and citric acid)-IONIC MAGNESIUM CITRATE    Historical Provider, MD  Probiotic Product (PROBIOTIC ADVANCED PO) Take 1 capsule by mouth 2 (two) times daily. 100 BILLION    Historical Provider, MD  QUERCETIN PO Take by mouth.    Historical Provider, MD  ranitidine (ZANTAC) 150 MG capsule Take 150 mg by mouth 2 (two) times daily.    Historical Provider, MD  THEANINE PO Take by mouth.    Historical Provider, MD    Family History Family History  Problem Relation Age of Onset  . Heart disease Mother     CHF  .  Hypertension Mother   . Cancer Father 89    lung cancer  . Hypertension Paternal Grandmother   . Stroke Paternal Grandmother   . Hypertension Paternal Grandfather     Social History Social History  Substance Use Topics  . Smoking status: Never Smoker  . Smokeless tobacco: Never Used  . Alcohol use Yes     Comment: Ocaasional glass of wine     Allergies   Azithromycin; Prednisone; Sulfa antibiotics; Sulfacetamide sodium; and Wheat bran   Review of Systems Review of Systems A complete 10 system review of systems was obtained and all systems are negative except as noted in the HPI and PMH.    Physical Exam Updated Vital Signs BP (!) 162/99   Pulse 95   Temp 98.4 F (36.9 C) (Oral)   Resp 16   Ht 5\' 7"  (1.702 m)   Wt 141 lb (64 kg)   LMP 03/15/2013   SpO2 100%   BMI 22.08 kg/m   Physical Exam  Constitutional: She is oriented to person, place, and time. She appears well-developed and well-nourished. No distress.  HENT:  Head: Normocephalic and atraumatic.  Right Ear: Tympanic membrane normal.  Left Ear: Tympanic membrane normal.  Nose: Nose normal. No mucosal edema.  Mouth/Throat: Uvula is midline, oropharynx is clear and moist and mucous membranes are normal. No oropharyngeal exudate, posterior oropharyngeal edema or posterior oropharyngeal erythema. No tonsillar exudate.  Mild sinus tenderness.   Eyes: Conjunctivae and EOM are normal. Pupils are equal, round, and reactive to light. Right eye exhibits no discharge. Left eye exhibits no discharge. No scleral icterus.  Neck: Normal range of motion. Neck supple.  Cardiovascular: Normal rate and regular rhythm.  Exam reveals no gallop and no friction rub.   No murmur heard. Pulmonary/Chest: Effort normal and breath sounds normal. No stridor. No respiratory distress. She has no rales.  Abdominal: Soft. She exhibits no distension. There is no tenderness.  Musculoskeletal: She exhibits no edema or tenderness.    Neurological: She is alert and oriented to person, place, and time.  Skin: Skin is warm and dry. No rash noted. She is not diaphoretic. No erythema.  Psychiatric: She has a normal mood and affect.  Vitals reviewed.    ED Treatments / Results   DIAGNOSTIC STUDIES: Oxygen Saturation is 100% on RA, normal by my interpretation.    COORDINATION OF CARE: 9:01 PM Discussed treatment plan with pt at bedside which includes labs and pt agreed to plan.    Labs (all labs ordered are listed, but only abnormal results are displayed) Labs Reviewed  CBC WITH DIFFERENTIAL/PLATELET - Abnormal; Notable for the following:       Result Value   RBC 3.67 (*)    All other components within normal limits  COMPREHENSIVE METABOLIC PANEL - Abnormal; Notable for the following:    Sodium 134 (*)  Potassium 3.3 (*)    Chloride 97 (*)    All other components within normal limits    EKG  EKG Interpretation None       Radiology No results found.  Procedures Procedures (including critical care time)  Medications Ordered in ED Medications  sodium chloride 0.9 % bolus 1,000 mL (0 mLs Intravenous Stopped 08/14/16 2111)  sodium chloride 0.9 % bolus 500 mL (0 mLs Intravenous Stopped 08/14/16 2142)     Initial Impression / Assessment and Plan / ED Course  I have reviewed the triage vital signs and the nursing notes.  Pertinent labs & imaging results that were available during my care of the patient were reviewed by me and considered in my medical decision making (see chart for details).     Consistent with exacerbation of patient's Potts. No infectious processes identified. Labs grossly reassuring. Patient provided with IV fluids and oral hydration with significant improvement of her symptomatology.  The patient is safe for discharge with strict return precautions.   Final Clinical Impressions(s) / ED Diagnoses   Final diagnoses:  Dehydration   Disposition: Discharge  Condition:  Good  I have discussed the results, Dx and Tx plan with the patient who expressed understanding and agree(s) with the plan. Discharge instructions discussed at great length. The patient was given strict return precautions who verbalized understanding of the instructions. No further questions at time of discharge.    New Prescriptions   No medications on file    Follow Up: Eulas Post, MD [place] [id number] [phone]  Schedule an appointment as soon as possible for a visit     I personally performed the services described in this documentation, which was scribed in my presence. The recorded information has been reviewed and is accurate.        Fatima Blank, MD 08/14/16 2152

## 2016-08-18 ENCOUNTER — Ambulatory Visit: Payer: Medicare HMO | Admitting: Family Medicine

## 2016-08-21 ENCOUNTER — Encounter (HOSPITAL_BASED_OUTPATIENT_CLINIC_OR_DEPARTMENT_OTHER): Payer: Self-pay | Admitting: *Deleted

## 2016-08-21 ENCOUNTER — Emergency Department (HOSPITAL_BASED_OUTPATIENT_CLINIC_OR_DEPARTMENT_OTHER)
Admission: EM | Admit: 2016-08-21 | Discharge: 2016-08-21 | Disposition: A | Discharge status: Home / Self Care | Payer: Medicare HMO | Attending: Emergency Medicine | Admitting: Emergency Medicine

## 2016-08-21 DIAGNOSIS — E860 Dehydration: Secondary | ICD-10-CM | POA: Insufficient documentation

## 2016-08-21 DIAGNOSIS — I10 Essential (primary) hypertension: Secondary | ICD-10-CM | POA: Insufficient documentation

## 2016-08-21 DIAGNOSIS — R000 Tachycardia, unspecified: Secondary | ICD-10-CM

## 2016-08-21 DIAGNOSIS — I498 Other specified cardiac arrhythmias: Secondary | ICD-10-CM | POA: Diagnosis not present

## 2016-08-21 DIAGNOSIS — I951 Orthostatic hypotension: Secondary | ICD-10-CM

## 2016-08-21 DIAGNOSIS — G90A Postural orthostatic tachycardia syndrome (POTS): Secondary | ICD-10-CM

## 2016-08-21 DIAGNOSIS — R5383 Other fatigue: Secondary | ICD-10-CM | POA: Diagnosis present

## 2016-08-21 DIAGNOSIS — R0981 Nasal congestion: Secondary | ICD-10-CM | POA: Diagnosis not present

## 2016-08-21 LAB — BASIC METABOLIC PANEL
Anion gap: 7 (ref 5–15)
BUN: 10 mg/dL (ref 6–20)
CHLORIDE: 101 mmol/L (ref 101–111)
CO2: 29 mmol/L (ref 22–32)
CREATININE: 0.57 mg/dL (ref 0.44–1.00)
Calcium: 9.4 mg/dL (ref 8.9–10.3)
GFR calc Af Amer: >60 mL/min (ref >60)
Glucose, Bld: 107 mg/dL — ABNORMAL HIGH (ref 65–99)
Potassium: 4 mmol/L (ref 3.5–5.1)
SODIUM: 137 mmol/L (ref 135–145)

## 2016-08-21 MED ORDER — SODIUM CHLORIDE 0.9 % IV BOLUS (SEPSIS)
1000.0000 mL | Freq: Once | INTRAVENOUS | Status: AC
  Administered 2016-08-21: 1000 mL via INTRAVENOUS

## 2016-08-21 MED ORDER — AMOXICILLIN-POT CLAVULANATE 875-125 MG PO TABS: 1.0000 | ORAL_TABLET | Freq: Two times a day (BID) | ORAL | 0 refills | Status: DC

## 2016-08-21 MED FILL — AMOX-CLAV 875-125 MG TABLET: 875-125 | 7 days supply | Qty: 14 | Fill #0

## 2016-08-21 NOTE — ED Provider Notes (Signed)
[facility] DEPT [facility] Provider Note   CSN: [id number] Arrival date & time: 08/21/16  1004     History   Chief Complaint No chief complaint on file.   HPI Katie [name] is a 61 y.o. female.  Patient is a 61 year old female with a history of POTS, Mass cell activation syndrome, hyponatremia, hypertension presenting today with a complaint of feeling dehydrated. Patient dates in the last few weeks she has been overdoing it. She has rented out her home for furniture market and has been staying in a [facility]. She has been doing a lot of work and has been trying to keep up on her fluids but knows that she must be dehydrated. She describes her symptoms as a general fatigue, elevated blood pressures, no energy. She did have several episodes of diarrhea this morning it denies any abdominal pain nausea or vomiting. She was seen in the emergency room approximately one week ago for similar symptoms. She felt significantly better after fluids but states that she continues to overdo it at home. Several weeks ago she was treated for a sinus infection but has been off all antibiotics. She denies fever, cough, congestion or shortness of breath.   The history is provided by the patient.    Past Medical History:  Diagnosis Date  . Anxiety   . Fibromyalgia   . Hypertension   . Hyponatremia   . Lyme disease   . Lyme disease   . Mast cell activation syndrome ([place])   . POTS (postural orthostatic tachycardia syndrome)     Patient Active Problem List   Diagnosis Date Noted  . Diarrhea 11/14/2015  . Acute gastroenteritis 11/14/2015  . Dehydration   . Enteritis 11/13/2015  . Hyponatremia 11/13/2015  . Elevated blood pressure 07/14/2013  . Lyme disease 10/25/2012  . Chronic fatigue syndrome 10/25/2012  . Anxiety and depression 10/25/2012  . Fibromyalgia 10/25/2012  . Hormone replacement therapy - followed by Dr. Lonia Skinner in gyn and by [facility] 10/25/2012    Past  Surgical History:  Procedure Laterality Date  . Cataract Removal With Lens Implant Placement Left 03-06-2015    OB History    No data available       Home Medications    Prior to Admission medications   Medication Sig Start Date End Date Taking? Authorizing Provider  b complex vitamins tablet Take 1 tablet by mouth daily. B-SUPREME    Historical Provider, MD  cetirizine (ZYRTEC ALLERGY) 10 MG tablet Take 10 mg by mouth daily.    Historical Provider, MD  Cholecalciferol (VITAMIN D-3) 5000 UNITS TABS Take 5,000 tablets by mouth daily.    Historical Provider, MD  Coenzyme Q10 (CO Q10 PO) Take 400 mg by mouth daily.    Historical Provider, MD  CYANOCOBALAMIN-METHYLCOBALAMIN SL Inject 1 mL as directed every 7 (seven) days.     Historical Provider, MD  folic acid (FOLVITE) 1 MG tablet Take 1 mg by mouth daily.    Historical Provider, MD  KETOTIFEN FUMARATE OP Apply to eye.    Historical Provider, MD  montelukast (SINGULAIR) 10 MG tablet Take 10 mg by mouth at bedtime.    Historical Provider, MD  Nutritional Supplements (RESTORE-X PO) Take by mouth.    Historical Provider, MD  Omega-3 Fatty Acids (FISH OIL PO) Take 2 capsules by mouth 2 (two) times daily. BARLEANS FISH OIL    Historical Provider, MD  OVER THE COUNTER MEDICATION Take 2 capsules by mouth daily. S-Acetyl Glutathione (SYNERGY)  Historical Provider, MD  OVER THE COUNTER MEDICATION Take 325 mg by mouth 2 (two) times daily. NATURAL CALM (magnesium carbonate and citric acid)-IONIC MAGNESIUM CITRATE    Historical Provider, MD  Probiotic Product (PROBIOTIC ADVANCED PO) Take 1 capsule by mouth 2 (two) times daily. 100 BILLION    Historical Provider, MD  QUERCETIN PO Take by mouth.    Historical Provider, MD  ranitidine (ZANTAC) 150 MG capsule Take 150 mg by mouth 2 (two) times daily.    Historical Provider, MD  THEANINE PO Take by mouth.    Historical Provider, MD    Family History Family History  Problem Relation Age of Onset  .  Heart disease Mother     CHF  . Hypertension Mother   . Cancer Father 1    lung cancer  . Hypertension Paternal Grandmother   . Stroke Paternal Grandmother   . Hypertension Paternal Grandfather     Social History Social History  Substance Use Topics  . Smoking status: Never Smoker  . Smokeless tobacco: Never Used  . Alcohol use Yes     Comment: Ocaasional glass of wine     Allergies   Azithromycin; Prednisone; Sulfa antibiotics; Sulfacetamide sodium; and Wheat bran   Review of Systems Review of Systems  All other systems reviewed and are negative.    Physical Exam Updated Vital Signs BP (!) 179/111 (BP Location: Right Arm)   Pulse 84   Temp 97.5 F (36.4 C) (Oral)   Resp 20   LMP 03/15/2013   SpO2 100%   Physical Exam  Constitutional: She is oriented to person, place, and time. She appears well-developed and well-nourished. No distress.  HENT:  Head: Normocephalic and atraumatic.  Mouth/Throat: Oropharynx is clear and moist.  Eyes: Conjunctivae and EOM are normal. Pupils are equal, round, and reactive to light.  Neck: Normal range of motion. Neck supple.  Cardiovascular: Normal rate, regular rhythm and intact distal pulses.   No murmur heard. Pulmonary/Chest: Effort normal and breath sounds normal. No respiratory distress. She has no wheezes. She has no rales.  Abdominal: Soft. She exhibits no distension. There is no tenderness. There is no rebound and no guarding.  Musculoskeletal: Normal range of motion. She exhibits no edema or tenderness.  Neurological: She is alert and oriented to person, place, and time.  Skin: Skin is warm and dry. No rash noted. No erythema.  Psychiatric: She has a normal mood and affect. Her behavior is normal.  Nursing note and vitals reviewed.    ED Treatments / Results  Labs (all labs ordered are listed, but only abnormal results are displayed) Labs Reviewed  BASIC METABOLIC PANEL    EKG  EKG Interpretation None        Radiology No results found.  Procedures Procedures (including critical care time)  Medications Ordered in ED Medications  sodium chloride 0.9 % bolus 1,000 mL (1,000 mLs Intravenous New Bag/Given 08/21/16 1035)     Initial Impression / Assessment and Plan / ED Course  I have reviewed the triage vital signs and the nursing notes.  Pertinent labs & imaging results that were available during my care of the patient were reviewed by me and considered in my medical decision making (see chart for details).     Pt with hx of POTS who presents today with concern for dehydration.  She is HTN here but in no acute distress.  Norma HR.  No focal pain or concern for abdominal/chest pathology.  Denies urinary sx.  Sx consistent the POTS.  Pt given 1015mL of NS.  BMP with normal sodium and potassium today. Pt improved after fluids and d/ced home.  On reevaluation patient is complaining of pressure in her sinuses she already takes nasal spray and oral antihistamines. Recommended she continue using that. She was given prescription for Augmentin to start if symptoms do not improve.  Final Clinical Impressions(s) / ED Diagnoses   Final diagnoses:  POTS (postural orthostatic tachycardia syndrome)  Dehydration  Sinus congestion    New Prescriptions New Prescriptions   AMOXICILLIN-CLAVULANATE (AUGMENTIN) 875-125 MG TABLET    Take 1 tablet by mouth every 12 (twelve) hours.     Blanchie Dessert, MD 08/21/16 1213

## 2016-08-21 NOTE — ED Triage Notes (Signed)
Pt reports her usual dehydration symptoms after cleaning her house on Monday and Tuesday. Pt has a letter from her specialist at [facility] requesting iv fluids for these symptoms. Pt denies any other c/o or needs, denies any pain.

## 2016-08-27 ENCOUNTER — Encounter (HOSPITAL_BASED_OUTPATIENT_CLINIC_OR_DEPARTMENT_OTHER): Payer: Self-pay

## 2016-08-27 ENCOUNTER — Emergency Department (HOSPITAL_BASED_OUTPATIENT_CLINIC_OR_DEPARTMENT_OTHER)
Admission: EM | Admit: 2016-08-27 | Discharge: 2016-08-27 | Disposition: A | Discharge status: Home / Self Care | Payer: Medicare HMO | Attending: Emergency Medicine | Admitting: Emergency Medicine

## 2016-08-27 DIAGNOSIS — E860 Dehydration: Secondary | ICD-10-CM | POA: Insufficient documentation

## 2016-08-27 DIAGNOSIS — Z79899 Other long term (current) drug therapy: Secondary | ICD-10-CM | POA: Diagnosis not present

## 2016-08-27 DIAGNOSIS — J0101 Acute recurrent maxillary sinusitis: Secondary | ICD-10-CM | POA: Insufficient documentation

## 2016-08-27 DIAGNOSIS — R531 Weakness: Secondary | ICD-10-CM

## 2016-08-27 DIAGNOSIS — I10 Essential (primary) hypertension: Secondary | ICD-10-CM | POA: Diagnosis not present

## 2016-08-27 LAB — COMPREHENSIVE METABOLIC PANEL
ALK PHOS: 59 U/L (ref 38–126)
ALT: 18 U/L (ref 14–54)
ANION GAP: 9 (ref 5–15)
AST: 24 U/L (ref 15–41)
Albumin: 3.9 g/dL (ref 3.5–5.0)
BILIRUBIN TOTAL: 0.6 mg/dL (ref 0.3–1.2)
BUN: 8 mg/dL (ref 6–20)
CALCIUM: 9.1 mg/dL (ref 8.9–10.3)
CO2: 28 mmol/L (ref 22–32)
CREATININE: 0.67 mg/dL (ref 0.44–1.00)
Chloride: 97 mmol/L — ABNORMAL LOW (ref 101–111)
GFR calc non Af Amer: >60 mL/min (ref >60)
GLUCOSE: 110 mg/dL — AB (ref 65–99)
Potassium: 3.4 mmol/L — ABNORMAL LOW (ref 3.5–5.1)
Sodium: 134 mmol/L — ABNORMAL LOW (ref 135–145)
TOTAL PROTEIN: 7.4 g/dL (ref 6.5–8.1)

## 2016-08-27 LAB — CBC WITH DIFFERENTIAL/PLATELET
BASOS ABS: 0 10*3/uL (ref 0.0–0.1)
BASOS PCT: 1 %
EOS ABS: 0.3 10*3/uL (ref 0.0–0.7)
Eosinophils Relative: 7 %
HEMATOCRIT: 37.6 % (ref 36.0–46.0)
Hemoglobin: 12.6 g/dL (ref 12.0–15.0)
Lymphocytes Relative: 22 %
Lymphs Abs: 0.9 10*3/uL (ref 0.7–4.0)
MCH: 33.2 pg (ref 26.0–34.0)
MCHC: 33.5 g/dL (ref 30.0–36.0)
MCV: 99.2 fL (ref 78.0–100.0)
MONO ABS: 0.6 10*3/uL (ref 0.1–1.0)
Monocytes Relative: 15 %
NEUTROS ABS: 2.2 10*3/uL (ref 1.7–7.7)
NEUTROS PCT: 55 %
Platelets: 215 10*3/uL (ref 150–400)
RBC: 3.79 MIL/uL — ABNORMAL LOW (ref 3.87–5.11)
RDW: 12.3 % (ref 11.5–15.5)
WBC: 3.9 10*3/uL — ABNORMAL LOW (ref 4.0–10.5)

## 2016-08-27 MED ORDER — DOXYCYCLINE HYCLATE 100 MG PO CAPS: 100.0000 mg | ORAL_CAPSULE | Freq: Two times a day (BID) | ORAL | 0 refills | Status: AC

## 2016-08-27 MED ORDER — SODIUM CHLORIDE 0.9 % IV BOLUS (SEPSIS)
1000.0000 mL | Freq: Once | INTRAVENOUS | Status: AC
  Administered 2016-08-27: 1000 mL via INTRAVENOUS

## 2016-08-27 NOTE — Discharge Instructions (Signed)
You were seen in the ED today with dehydration and sinusitis. We gave IV fluids and you are feeling better. I provided an antibiotic for use if symptoms worsen in the next week but often these infections are non-bacterial. I would recommend completing the current antibiotic and discuss the case with your PCP.

## 2016-08-27 NOTE — ED Triage Notes (Addendum)
Pt reports generalized weakness, "brain fog" since last night states similar to previous episodes of POTS, and dysautonomia. Reports ongoing sinus pressure, nasal congestion for one week. Reports starting Augmentin on Friday night. Pt states she needs IV fluids.

## 2016-08-27 NOTE — ED Provider Notes (Signed)
Emergency Department Provider Note   I have reviewed the triage vital signs and the nursing notes.   HISTORY  Chief Complaint Weakness   HPI Katie [name] is a 61 y.o. female with PMH of anxiety, fibromyalgia, POTS, and mast cell activation syndrome presents to the emergency department for evaluation of sinus pressure, dehydration, MCAS flare symptoms. Patient states that she began to develop sinus symptoms 4-5 days ago. She saw her primary care physician who started her on Augmentin. She continues to have sinus pressure and drainage. She is blowing her nose and having a large amount of thick mucus come out. No blood. She has baseline dyspnea which she attributes to her chronic fatigue. Patient states that she is followed by her provider for her mast cell activation syndrome who recommends IV fluids for symptoms. Last night she had an "attack" that spontaneously resolved. This AM she continues to feel generally weak and have "brain fog" which she frequently gets with MCAS. No fever, chills, neck stiffness. No unilateral symptoms. No difficulty walking. No vision changes. She does have a mild headache with her face pressure. Also reports a mild sore throat.   Past Medical History:  Diagnosis Date  . Anxiety   . Fibromyalgia   . Hypertension   . Hyponatremia   . Lyme disease   . Lyme disease   . Mast cell activation syndrome ([place])   . POTS (postural orthostatic tachycardia syndrome)     Patient Active Problem List   Diagnosis Date Noted  . Diarrhea 11/14/2015  . Acute gastroenteritis 11/14/2015  . Dehydration   . Enteritis 11/13/2015  . Hyponatremia 11/13/2015  . Elevated blood pressure 07/14/2013  . Lyme disease 10/25/2012  . Chronic fatigue syndrome 10/25/2012  . Anxiety and depression 10/25/2012  . Fibromyalgia 10/25/2012  . Hormone replacement therapy - followed by Dr. Lonia Skinner in gyn and by [place] 10/25/2012    Past Surgical History:    Procedure Laterality Date  . Cataract Removal With Lens Implant Placement Left 03-06-2015    Current Outpatient Rx  . Order #: [id number] Class: Print  . Order #: [id number] Class: Historical Med  . Order #: [id number] Class: Historical Med  . Order #: [id number] Class: Historical Med  . Order #: [id number] Class: Historical Med  . Order #: [id number] Class: Historical Med  . Order #: [id number] Class: Historical Med  . Order #: [id number] Class: Historical Med  . Order #: [id number] Class: Historical Med  . Order #: [id number] Class: Historical Med  . Order #: [id number] Class: Historical Med  . Order #: [id number] Class: Historical Med  . Order #: [id number] Class: Historical Med  . Order #: [id number] Class: Historical Med  . Order #: [id number] Class: Historical Med  . Order #: [id number] Class: Historical Med  . Order #: [id number] Class: Historical Med  . Order #: [id number] Class: Print    Allergies Azithromycin; Prednisone; Sulfa antibiotics; Sulfacetamide sodium; and Wheat bran  Family History  Problem Relation Age of Onset  . Heart disease Mother     CHF  . Hypertension Mother   . Cancer Father 13    lung cancer  . Hypertension Paternal Grandmother   . Stroke Paternal Grandmother   . Hypertension Paternal Grandfather     Social History Social History  Substance Use Topics  . Smoking status: Never Smoker  . Smokeless tobacco: Never Used  . Alcohol use Yes     Comment: Ocaasional glass of wine    Review of Systems  Constitutional: No fever/chills. Positive weakness and fatigue.  Eyes: No visual  changes. ENT: No sore throat. Positive face pain.  Cardiovascular: Denies chest pain. Respiratory: Mild shortness of breath. Gastrointestinal: No abdominal pain. No nausea, no vomiting. No diarrhea. No constipation. Genitourinary: Negative for dysuria. Musculoskeletal: Negative for back pain. Skin: Negative for rash. Neurological: Negative for headaches, focal weakness or numbness.  10-point  ROS otherwise negative.  ____________________________________________   PHYSICAL EXAM:  VITAL SIGNS: ED Triage Vitals [08/27/16 1157]  Enc Vitals Group     BP (!) 158/93     Pulse Rate 92     Resp 20     Temp 98.1 F (36.7 C)     Temp Source Oral     SpO2 100 %     Weight 140 lb (63.5 kg)     Height 5\' 7"  (1.702 m)     Pain Score 5   Constitutional: Alert and oriented. Well appearing and in no acute distress. Eyes: Conjunctivae are normal.  Head: Atraumatic. Ears:  Healthy appearing ear canals and TMs bilaterally Nose: No congestion/rhinnorhea. Mouth/Throat: Mucous membranes are dry. Oropharynx non-erythematous. Neck: No stridor.   Cardiovascular: Normal rate, regular rhythm. Good peripheral circulation. Grossly normal heart sounds.   Respiratory: Normal respiratory effort.  No retractions. Lungs CTAB. Gastrointestinal: Soft and nontender. No distention.  Musculoskeletal: No lower extremity tenderness nor edema. No gross deformities of extremities. Neurologic:  Normal speech and language. No gross focal neurologic deficits are appreciated.  Skin:  Skin is warm, dry and intact. No rash noted.  ____________________________________________   LABS (all labs ordered are listed, but only abnormal results are displayed)  Labs Reviewed  COMPREHENSIVE METABOLIC PANEL - Abnormal; Notable for the following:       Result Value   Sodium 134 (*)    Potassium 3.4 (*)    Chloride 97 (*)    Glucose, Bld 110 (*)    All other components within normal limits  CBC WITH DIFFERENTIAL/PLATELET - Abnormal; Notable for the following:    WBC 3.9 (*)    RBC 3.79 (*)    All other components within normal limits   ____________________________________________   PROCEDURES  Procedure(s) performed:   Procedures  None ____________________________________________   INITIAL IMPRESSION / ASSESSMENT AND PLAN / ED COURSE  Pertinent labs & imaging results that were available during my  care of the patient were reviewed by me and considered in my medical decision making (see chart for details).  Patient presents to the emergency room department for evaluation of generalized fatigue in the setting of sinusitis and mast cell activation syndrome flare. No fever, chills, or other concerning symptoms of infection. The patient is here requesting IV fluids which typically improve her symptoms. She has no focal neurological findings on my exam. Plan for IV fluids and baseline lab work with her generalized weakness to rule out severe anemia or electrolyte issue.   01:27 PM Patient's feeling much better after IV fluids. No significant lab abnormalities. Discussed continuing her current antibiotic. She is interested in possibly changing antibiotics if we think this is best. I encouraged her to complete her current course and then reassess I will provide a prescription for doxycycline which she could start if symptoms significantly worsen but discussed that often these infections are either viral or related to seasonal allergies. The patient verbalizes understanding and will discuss with her primary care physician prior to starting any new antibiotics.  At this time, I do not feel there is any life-threatening condition present. I have reviewed and discussed all results (EKG, imaging,  lab, urine as appropriate), exam findings with patient. I have reviewed nursing notes and appropriate previous records.  I feel the patient is safe to be discharged home without further emergent workup. Discussed usual and customary return precautions. Patient and family (if present) verbalize understanding and are comfortable with this plan.  Patient will follow-up with their primary care provider. If they do not have a primary care provider, information for follow-up has been provided to them. All questions have been answered.  ____________________________________________  FINAL CLINICAL IMPRESSION(S) / ED  DIAGNOSES  Final diagnoses:  Generalized weakness  Dehydration  Acute recurrent maxillary sinusitis     MEDICATIONS GIVEN DURING THIS VISIT:  Medications  sodium chloride 0.9 % bolus 1,000 mL (0 mLs Intravenous Stopped 08/27/16 1331)     NEW OUTPATIENT MEDICATIONS STARTED DURING THIS VISIT:  Discharge Medication List as of 08/27/2016  1:30 PM    START taking these medications   Details  doxycycline (VIBRAMYCIN) 100 MG capsule Take 1 capsule (100 mg total) by mouth 2 (two) times daily., Starting Wed 08/27/2016, Until Wed 09/03/2016, Print        Note:  This document was prepared using Dragon voice recognition software and may include unintentional dictation errors.  Nanda Quinton, MD Emergency Medicine   Margette Fast, MD 08/27/16 [phone]

## 2016-09-08 DIAGNOSIS — R5382 Chronic fatigue, unspecified: Secondary | ICD-10-CM | POA: Diagnosis not present

## 2016-09-08 DIAGNOSIS — E233 Hypothalamic dysfunction, not elsewhere classified: Secondary | ICD-10-CM | POA: Diagnosis not present

## 2016-09-08 DIAGNOSIS — J329 Chronic sinusitis, unspecified: Secondary | ICD-10-CM | POA: Diagnosis not present

## 2016-09-08 DIAGNOSIS — I159 Secondary hypertension, unspecified: Secondary | ICD-10-CM | POA: Diagnosis not present

## 2016-09-14 ENCOUNTER — Emergency Department (HOSPITAL_BASED_OUTPATIENT_CLINIC_OR_DEPARTMENT_OTHER)
Admission: EM | Admit: 2016-09-14 | Discharge: 2016-09-14 | Disposition: A | Discharge status: Home / Self Care | Payer: Medicare HMO | Attending: Emergency Medicine | Admitting: Emergency Medicine

## 2016-09-14 ENCOUNTER — Encounter (HOSPITAL_BASED_OUTPATIENT_CLINIC_OR_DEPARTMENT_OTHER): Payer: Self-pay | Admitting: Emergency Medicine

## 2016-09-14 DIAGNOSIS — R000 Tachycardia, unspecified: Secondary | ICD-10-CM

## 2016-09-14 DIAGNOSIS — I951 Orthostatic hypotension: Secondary | ICD-10-CM

## 2016-09-14 DIAGNOSIS — I498 Other specified cardiac arrhythmias: Secondary | ICD-10-CM | POA: Insufficient documentation

## 2016-09-14 DIAGNOSIS — R197 Diarrhea, unspecified: Secondary | ICD-10-CM | POA: Insufficient documentation

## 2016-09-14 DIAGNOSIS — I10 Essential (primary) hypertension: Secondary | ICD-10-CM | POA: Insufficient documentation

## 2016-09-14 DIAGNOSIS — R531 Weakness: Secondary | ICD-10-CM | POA: Diagnosis present

## 2016-09-14 DIAGNOSIS — R110 Nausea: Secondary | ICD-10-CM | POA: Diagnosis not present

## 2016-09-14 DIAGNOSIS — G90A Postural orthostatic tachycardia syndrome (POTS): Secondary | ICD-10-CM

## 2016-09-14 LAB — BASIC METABOLIC PANEL
ANION GAP: 5 (ref 5–15)
BUN: 10 mg/dL (ref 6–20)
CHLORIDE: 104 mmol/L (ref 101–111)
CO2: 29 mmol/L (ref 22–32)
Calcium: 8.7 mg/dL — ABNORMAL LOW (ref 8.9–10.3)
Creatinine, Ser: 0.87 mg/dL (ref 0.44–1.00)
GFR calc non Af Amer: >60 mL/min (ref >60)
Glucose, Bld: 92 mg/dL (ref 65–99)
POTASSIUM: 3.4 mmol/L — AB (ref 3.5–5.1)
Sodium: 138 mmol/L (ref 135–145)

## 2016-09-14 LAB — URINALYSIS, ROUTINE W REFLEX MICROSCOPIC
Bilirubin Urine: NEGATIVE
Glucose, UA: NEGATIVE mg/dL
Hgb urine dipstick: NEGATIVE
Ketones, ur: NEGATIVE mg/dL
NITRITE: NEGATIVE
PROTEIN: NEGATIVE mg/dL
SPECIFIC GRAVITY, URINE: 1.003 — AB (ref 1.005–1.030)
pH: 7.5 (ref 5.0–8.0)

## 2016-09-14 LAB — CBC WITH DIFFERENTIAL/PLATELET
Basophils Absolute: 0 10*3/uL (ref 0.0–0.1)
Basophils Relative: 0 %
EOS PCT: 5 %
Eosinophils Absolute: 0.3 10*3/uL (ref 0.0–0.7)
HEMATOCRIT: 35.5 % — AB (ref 36.0–46.0)
Hemoglobin: 11.8 g/dL — ABNORMAL LOW (ref 12.0–15.0)
LYMPHS PCT: 33 %
Lymphs Abs: 1.8 10*3/uL (ref 0.7–4.0)
MCH: 32.9 pg (ref 26.0–34.0)
MCHC: 33.2 g/dL (ref 30.0–36.0)
MCV: 98.9 fL (ref 78.0–100.0)
MONO ABS: 0.5 10*3/uL (ref 0.1–1.0)
MONOS PCT: 9 %
NEUTROS ABS: 2.9 10*3/uL (ref 1.7–7.7)
Neutrophils Relative %: 53 %
Platelets: 232 10*3/uL (ref 150–400)
RBC: 3.59 MIL/uL — ABNORMAL LOW (ref 3.87–5.11)
RDW: 11.9 % (ref 11.5–15.5)
WBC: 5.4 10*3/uL (ref 4.0–10.5)

## 2016-09-14 LAB — URINALYSIS, MICROSCOPIC (REFLEX): RBC / HPF: NONE SEEN RBC/hpf (ref 0–5)

## 2016-09-14 MED ORDER — ONDANSETRON HCL 4 MG/2ML IJ SOLN
4.0000 mg | Freq: Once | INTRAMUSCULAR | Status: AC
  Administered 2016-09-14: 4 mg via INTRAVENOUS

## 2016-09-14 MED ORDER — ONDANSETRON HCL 4 MG/2ML IJ SOLN
INTRAMUSCULAR | Status: AC
  Filled 2016-09-14: qty 2

## 2016-09-14 MED ORDER — SODIUM CHLORIDE 0.9 % IV BOLUS (SEPSIS)
1000.0000 mL | Freq: Once | INTRAVENOUS | Status: AC
  Administered 2016-09-14: 1000 mL via INTRAVENOUS

## 2016-09-14 NOTE — ED Provider Notes (Signed)
[facility] Provider Note   CSN: [id number] Arrival date & time: 09/14/16  1625  By signing my name below, I, Reola Mosher, attest that this documentation has been prepared under the direction and in the presence of Malvin Johns, MD. Electronically Signed: Reola Mosher, ED Scribe. 09/14/16. 5:40 PM.  History   Chief Complaint Chief Complaint  Patient presents with  . Weakness   The history is provided by the patient and medical records. No language interpreter was used.   HPI Comments: Katie [name] is a 61 y.o. female with a h/o POTS, MCAS, fibromyalgia, chronic fatigue syndrome, and lyme disease, who presents to the Emergency Department complaining of persistent, worsening generalized weakness beginning this morning. Per pt, she woke up with her generalized weakness this morning and approximately four hours ago she had an acute onset of diarrhea with some associated nausea as well. Pt has a h/o MCAS and states that her symptoms today are similar to a flare-up of this in relation to being dehydrated. She has been seen in the ED for this issue previously, noting that her symptoms typically resolve following IV fluid hydration; additionally, she arrives with a note from the specialist she is followed w/ for her h/o MCAS who states that typical treatment for these issues includes a 500cc-1L bolus of NSL. She has attempted to remedy her symptoms at home by increasing her fluid intake but without significant improvement. She denies fever, vomiting, abdominal pain, urgency, frequency, hematuria, dysuria, difficulty urinating, or any other associated symptoms.    Past Medical History:  Diagnosis Date  . Anxiety   . Fibromyalgia   . Hypertension   . Hyponatremia   . Lyme disease   . Lyme disease   . Mast cell activation syndrome ([place])   . POTS (postural orthostatic tachycardia syndrome)    Patient Active Problem List   Diagnosis Date Noted  . Diarrhea  11/14/2015  . Acute gastroenteritis 11/14/2015  . Dehydration   . Enteritis 11/13/2015  . Hyponatremia 11/13/2015  . Elevated blood pressure 07/14/2013  . Lyme disease 10/25/2012  . Chronic fatigue syndrome 10/25/2012  . Anxiety and depression 10/25/2012  . Fibromyalgia 10/25/2012  . Hormone replacement therapy - followed by Dr. Lonia Skinner in gyn and by [place] 10/25/2012   Past Surgical History:  Procedure Laterality Date  . Cataract Removal With Lens Implant Placement Left 03-06-2015   OB History    No data available     Home Medications    Prior to Admission medications   Medication Sig Start Date End Date Taking? Authorizing Provider  amoxicillin-clavulanate (AUGMENTIN) 875-125 MG tablet Take 1 tablet by mouth every 12 (twelve) hours. 08/21/16   Blanchie Dessert, MD  b complex vitamins tablet Take 1 tablet by mouth daily. B-SUPREME    [provider]  cetirizine (ZYRTEC ALLERGY) 10 MG tablet Take 10 mg by mouth daily.    [provider]  Cholecalciferol (VITAMIN D-3) 5000 UNITS TABS Take 5,000 tablets by mouth daily.    [provider]  Coenzyme Q10 (CO Q10 PO) Take 400 mg by mouth daily.    [provider]  CYANOCOBALAMIN-METHYLCOBALAMIN SL Inject 1 mL as directed every 7 (seven) days.     [provider]  folic acid (FOLVITE) 1 MG tablet Take 1 mg by mouth daily.    [provider]  KETOTIFEN FUMARATE OP Apply to eye.    [provider]  montelukast (SINGULAIR) 10 MG tablet  Take 10 mg by mouth at bedtime.    [provider]  Nutritional Supplements (RESTORE-X PO) Take by mouth.    [provider]  Omega-3 Fatty Acids (FISH OIL PO) Take 2 capsules by mouth 2 (two) times daily. BARLEANS FISH OIL    [provider]  OVER THE COUNTER MEDICATION Take 2 capsules by mouth daily. S-Acetyl Glutathione (SYNERGY)    [provider]  OVER THE COUNTER MEDICATION Take  325 mg by mouth 2 (two) times daily. NATURAL CALM (magnesium carbonate and citric acid)-IONIC MAGNESIUM CITRATE    [provider]  Probiotic Product (PROBIOTIC ADVANCED PO) Take 1 capsule by mouth 2 (two) times daily. 100 BILLION    [provider]  QUERCETIN PO Take by mouth.    [provider]  ranitidine (ZANTAC) 150 MG capsule Take 150 mg by mouth 2 (two) times daily.    [provider]  THEANINE PO Take by mouth.    [provider]   Family History Family History  Problem Relation Age of Onset  . Heart disease Mother        CHF  . Hypertension Mother   . Cancer Father 40       lung cancer  . Hypertension Paternal Grandmother   . Stroke Paternal Grandmother   . Hypertension Paternal Grandfather    Social History Social History  Substance Use Topics  . Smoking status: Never Smoker  . Smokeless tobacco: Never Used  . Alcohol use Yes     Comment: Ocaasional glass of wine   Allergies   Azithromycin; Prednisone; Sulfa antibiotics; Sulfacetamide sodium; and Wheat bran  Review of Systems Review of Systems  Constitutional: Negative for chills, diaphoresis, fatigue and fever.  HENT: Negative for congestion, rhinorrhea and sneezing.   Eyes: Negative.   Respiratory: Negative for cough, chest tightness and shortness of breath.   Cardiovascular: Negative for chest pain and leg swelling.  Gastrointestinal: Positive for diarrhea and nausea. Negative for abdominal pain, blood in stool and vomiting.  Genitourinary: Negative for difficulty urinating, dysuria, flank pain, frequency, hematuria and urgency.  Musculoskeletal: Negative for arthralgias and back pain.  Skin: Negative for rash.  Neurological: Positive for weakness (generalized). Negative for dizziness, speech difficulty, numbness and headaches.  All other systems reviewed and are negative.  Physical Exam Updated Vital Signs BP (!) 167/106 (BP Location: Left Arm)   Pulse 90   Temp  98.4 F (36.9 C) (Oral)   Resp 20   Ht 5\' 7"  (1.702 m)   Wt 140 lb (63.5 kg)   LMP 03/15/2013   SpO2 100%   BMI 21.93 kg/m   Physical Exam  Constitutional: She is oriented to person, place, and time. She appears well-developed and well-nourished.  HENT:  Head: Normocephalic and atraumatic.  Eyes: Pupils are equal, round, and reactive to light.  Neck: Normal range of motion. Neck supple.  Cardiovascular: Normal rate, regular rhythm and normal heart sounds.   Pulmonary/Chest: Effort normal and breath sounds normal. No respiratory distress. She has no wheezes. She has no rales. She exhibits no tenderness.  Abdominal: Soft. Bowel sounds are normal. There is no tenderness. There is no rebound and no guarding.  Musculoskeletal: Normal range of motion. She exhibits no edema.  Lymphadenopathy:    She has no cervical adenopathy.  Neurological: She is alert and oriented to person, place, and time.  Skin: Skin is warm and dry. No rash noted.  Psychiatric: She has a normal mood and affect.  ED Treatments / Results  DIAGNOSTIC STUDIES: Oxygen Saturation is 100% on RA, normal by my interpretation.   COORDINATION OF CARE: 5:40 PM-Discussed next steps with pt. Pt verbalized understanding and is agreeable with the plan.   Labs (all labs ordered are listed, but only abnormal results are displayed) Labs Reviewed  CBC WITH DIFFERENTIAL/PLATELET - Abnormal; Notable for the following:       Result Value   RBC 3.59 (*)    Hemoglobin 11.8 (*)    HCT 35.5 (*)    All other components within normal limits  BASIC METABOLIC PANEL - Abnormal; Notable for the following:    Potassium 3.4 (*)    Calcium 8.7 (*)    All other components within normal limits  URINALYSIS, ROUTINE W REFLEX MICROSCOPIC - Abnormal; Notable for the following:    Specific Gravity, Urine 1.003 (*)    Leukocytes, UA TRACE (*)    All other components within normal limits  URINALYSIS, MICROSCOPIC (REFLEX) - Abnormal; Notable  for the following:    Bacteria, UA RARE (*)    Squamous Epithelial / LPF 0-5 (*)    All other components within normal limits   EKG  EKG Interpretation None      Radiology No results found.  Procedures Procedures   Medications Ordered in ED Medications  sodium chloride 0.9 % bolus 1,000 mL (0 mLs Intravenous Stopped 09/14/16 1739)  ondansetron (ZOFRAN) injection 4 mg (4 mg Intravenous Given 09/14/16 1746)   Initial Impression / Assessment and Plan / ED Course  I have reviewed the triage vital signs and the nursing notes.  Pertinent labs & imaging results that were available during my care of the patient were reviewed by me and considered in my medical decision making (see chart for details).     Patient is feeling much better after IV fluids and Zofran. Her vital signs are stable. Her labs are non-concerning. She was discharged home good condition. She was encouraged to follow-up with her PCP. Return precautions were given.  Final Clinical Impressions(s) / ED Diagnoses   Final diagnoses:  POTS (postural orthostatic tachycardia syndrome)   New Prescriptions New Prescriptions   No medications on file   I personally performed the services described in this documentation, which was scribed in my presence.  The recorded information has been reviewed and considered.     Malvin Johns, MD 09/14/16 [phone]

## 2016-09-14 NOTE — ED Triage Notes (Signed)
Generalized weakness and diarrhea since this morning. Hx of POTS. Pt requesting IV fluids.

## 2016-09-14 NOTE — ED Notes (Signed)
States," I feel better and please let the DR know, so I can go home" Up to BR, gait steady, denies dizziness. ED MD informed

## 2016-09-14 NOTE — ED Notes (Signed)
ED Provider at bedside. 

## 2016-09-15 ENCOUNTER — Telehealth: Payer: Self-pay

## 2016-09-15 NOTE — Telephone Encounter (Signed)
Patient is on the list for [employer] [facility] and may be a good candidate for an AWV. Please let me know if/when appt is scheduled.   

## 2016-09-24 DIAGNOSIS — H35371 Puckering of macula, right eye: Secondary | ICD-10-CM | POA: Diagnosis not present

## 2016-09-24 DIAGNOSIS — H43813 Vitreous degeneration, bilateral: Secondary | ICD-10-CM | POA: Diagnosis not present

## 2016-09-24 DIAGNOSIS — H35373 Puckering of macula, bilateral: Secondary | ICD-10-CM | POA: Diagnosis not present

## 2016-09-24 DIAGNOSIS — H35372 Puckering of macula, left eye: Secondary | ICD-10-CM | POA: Diagnosis not present

## 2016-09-24 DIAGNOSIS — D3132 Benign neoplasm of left choroid: Secondary | ICD-10-CM | POA: Diagnosis not present

## 2016-09-24 DIAGNOSIS — H43811 Vitreous degeneration, right eye: Secondary | ICD-10-CM | POA: Diagnosis not present

## 2016-09-24 DIAGNOSIS — H43812 Vitreous degeneration, left eye: Secondary | ICD-10-CM | POA: Diagnosis not present

## 2016-09-27 ENCOUNTER — Encounter (HOSPITAL_BASED_OUTPATIENT_CLINIC_OR_DEPARTMENT_OTHER): Payer: Self-pay | Admitting: Emergency Medicine

## 2016-09-27 ENCOUNTER — Emergency Department (HOSPITAL_BASED_OUTPATIENT_CLINIC_OR_DEPARTMENT_OTHER)
Admission: EM | Admit: 2016-09-27 | Discharge: 2016-09-27 | Disposition: A | Discharge status: Home / Self Care | Payer: Medicare HMO | Attending: Emergency Medicine | Admitting: Emergency Medicine

## 2016-09-27 DIAGNOSIS — D709 Neutropenia, unspecified: Secondary | ICD-10-CM

## 2016-09-27 DIAGNOSIS — I10 Essential (primary) hypertension: Secondary | ICD-10-CM | POA: Insufficient documentation

## 2016-09-27 DIAGNOSIS — R531 Weakness: Secondary | ICD-10-CM

## 2016-09-27 DIAGNOSIS — Z79899 Other long term (current) drug therapy: Secondary | ICD-10-CM | POA: Insufficient documentation

## 2016-09-27 LAB — COMPREHENSIVE METABOLIC PANEL
ALBUMIN: 3.9 g/dL (ref 3.5–5.0)
ALT: 16 U/L (ref 14–54)
ANION GAP: 9 (ref 5–15)
AST: 20 U/L (ref 15–41)
Alkaline Phosphatase: 70 U/L (ref 38–126)
BILIRUBIN TOTAL: 1.1 mg/dL (ref 0.3–1.2)
BUN: 9 mg/dL (ref 6–20)
CHLORIDE: 97 mmol/L — AB (ref 101–111)
CO2: 30 mmol/L (ref 22–32)
Calcium: 9 mg/dL (ref 8.9–10.3)
Creatinine, Ser: 0.77 mg/dL (ref 0.44–1.00)
GFR calc Af Amer: >60 mL/min (ref >60)
Glucose, Bld: 101 mg/dL — ABNORMAL HIGH (ref 65–99)
POTASSIUM: 3.8 mmol/L (ref 3.5–5.1)
Sodium: 136 mmol/L (ref 135–145)
TOTAL PROTEIN: 7.3 g/dL (ref 6.5–8.1)

## 2016-09-27 LAB — CBC WITH DIFFERENTIAL/PLATELET
BASOS ABS: 0.1 10*3/uL (ref 0.0–0.1)
Basophils Relative: 2 %
Eosinophils Absolute: 0.2 10*3/uL (ref 0.0–0.7)
Eosinophils Relative: 6 %
HEMATOCRIT: 38.4 % (ref 36.0–46.0)
HEMOGLOBIN: 13 g/dL (ref 12.0–15.0)
LYMPHS ABS: 1.3 10*3/uL (ref 0.7–4.0)
LYMPHS PCT: 39 %
MCH: 32.9 pg (ref 26.0–34.0)
MCHC: 33.9 g/dL (ref 30.0–36.0)
MCV: 97.2 fL (ref 78.0–100.0)
Monocytes Absolute: 0.4 10*3/uL (ref 0.1–1.0)
Monocytes Relative: 12 %
NEUTROS ABS: 1.4 10*3/uL — AB (ref 1.7–7.7)
Neutrophils Relative %: 41 %
Platelets: 238 10*3/uL (ref 150–400)
RBC: 3.95 MIL/uL (ref 3.87–5.11)
RDW: 11.9 % (ref 11.5–15.5)
WBC: 3.4 10*3/uL — AB (ref 4.0–10.5)

## 2016-09-27 LAB — MAGNESIUM: Magnesium: 1.9 mg/dL (ref 1.7–2.4)

## 2016-09-27 NOTE — ED Notes (Signed)
Pt refused wheelchair.  

## 2016-09-27 NOTE — ED Notes (Signed)
Patient stated during her discharge instructions that she will not follow up with her Doctor next week because she will be out of town.

## 2016-09-27 NOTE — ED Provider Notes (Signed)
[facility] DEPT Provider Note   CSN: [id number] Arrival date & time: 09/27/16  1107     History   Chief Complaint Chief Complaint  Patient presents with  . Weakness    HPI Katie [name] is a 61 y.o. female.  The history is provided by the patient. No language interpreter was used.  Weakness    Katie [name] is a 61 y.o. female who presents to the Emergency Department complaining of weakness. She has a history of pots and comes in for weakness and feeling poorly since yesterday. She states these symptoms are typical for her POTS. She is about to leave for the [facility] for a week and wants to make sure that she is well hydrated before she goes. She states that her body cannot hold onto water. She has increased stress at home due to her autistic son. No fevers, nausea, vomiting, diarrhea, chest pain or shortness of breath, no abdominal pain.  She also has a history of chronic fatigue syndrome and chronic Lyme.   She currently sees a specialist in [place]. She presents with a letter dated 07/24/2016 from Dr. Mariann Barter. It states that Katie [name] has dysautonomia and tachycardia prominent by mouth TLS. It states that she experiences hypertension and tachycardia and symptoms which may appear to be panic episodes and they respond well to IV hydration. Past Medical History:  Diagnosis Date  . Anxiety   . Fibromyalgia   . Hypertension   . Hyponatremia   . Lyme disease   . Lyme disease   . Mast cell activation syndrome ([place])   . POTS (postural orthostatic tachycardia syndrome)     Patient Active Problem List   Diagnosis Date Noted  . Diarrhea 11/14/2015  . Acute gastroenteritis 11/14/2015  . Dehydration   . Enteritis 11/13/2015  . Hyponatremia 11/13/2015  . Elevated blood pressure 07/14/2013  . Lyme disease 10/25/2012  . Chronic fatigue syndrome 10/25/2012  . Anxiety and depression 10/25/2012  . Fibromyalgia 10/25/2012  . Hormone replacement therapy - followed by  Dr. Lonia Skinner in gyn and by [place] 10/25/2012    Past Surgical History:  Procedure Laterality Date  . Cataract Removal With Lens Implant Placement Left 03-06-2015    OB History    No data available       Home Medications    Prior to Admission medications   Medication Sig Start Date End Date Taking? Authorizing Provider  amoxicillin-clavulanate (AUGMENTIN) 875-125 MG tablet Take 1 tablet by mouth every 12 (twelve) hours. 08/21/16   Blanchie Dessert, MD  b complex vitamins tablet Take 1 tablet by mouth daily. B-SUPREME    [provider]  cetirizine (ZYRTEC ALLERGY) 10 MG tablet Take 10 mg by mouth daily.    [provider]  Cholecalciferol (VITAMIN D-3) 5000 UNITS TABS Take 5,000 tablets by mouth daily.    [provider]  Coenzyme Q10 (CO Q10 PO) Take 400 mg by mouth daily.    [provider]  CYANOCOBALAMIN-METHYLCOBALAMIN SL Inject 1 mL as directed every 7 (seven) days.     [provider]  folic acid (FOLVITE) 1 MG tablet Take 1 mg by mouth daily.    [provider]  KETOTIFEN FUMARATE OP Apply to eye.    [provider]  montelukast (SINGULAIR) 10 MG tablet Take 10 mg by mouth at bedtime.    [provider]  Nutritional Supplements (RESTORE-X PO) Take by mouth.    [provider]  Omega-3 Fatty Acids (FISH OIL PO) Take 2 capsules by mouth 2 (two) times daily. BARLEANS FISH OIL    [provider]  OVER THE COUNTER MEDICATION Take 2 capsules by mouth daily. S-Acetyl Glutathione (SYNERGY)    [provider]  OVER THE COUNTER MEDICATION Take 325 mg by mouth 2 (two) times daily. NATURAL CALM (magnesium carbonate and citric acid)-IONIC MAGNESIUM CITRATE    [provider]  Probiotic Product (PROBIOTIC ADVANCED PO) Take 1 capsule by mouth 2 (two) times daily. 100 BILLION    [provider]  QUERCETIN PO Take by mouth.    [provider]    ranitidine (ZANTAC) 150 MG capsule Take 150 mg by mouth 2 (two) times daily.    [provider]  THEANINE PO Take by mouth.    [provider]    Family History Family History  Problem Relation Age of Onset  . Heart disease Mother        CHF  . Hypertension Mother   . Cancer Father 36       lung cancer  . Hypertension Paternal Grandmother   . Stroke Paternal Grandmother   . Hypertension Paternal Grandfather     Social History Social History  Substance Use Topics  . Smoking status: Never Smoker  . Smokeless tobacco: Never Used  . Alcohol use Yes     Comment: Ocaasional glass of wine     Allergies   Azithromycin; Prednisone; Sulfa antibiotics; Sulfacetamide sodium; and Wheat bran   Review of Systems Review of Systems  Neurological: Positive for weakness.  All other systems reviewed and are negative.    Physical Exam Updated Vital Signs BP 130/81 (BP Location: Right Arm)   Pulse 88   Temp 98.1 F (36.7 C) (Oral)   Resp 18   LMP 03/15/2013   SpO2 100%   Physical Exam  Constitutional: She is oriented to person, place, and time. She appears well-developed and well-nourished.  HENT:  Head: Normocephalic and atraumatic.  Cardiovascular: Normal rate and regular rhythm.   No murmur heard. Pulmonary/Chest: Effort normal and breath sounds normal. No respiratory distress.  Abdominal: Soft. There is no tenderness. There is no rebound and no guarding.  Musculoskeletal: She exhibits no edema or tenderness.  Neurological: She is alert and oriented to person, place, and time.  Skin: Skin is warm and dry.  Psychiatric: She has a normal mood and affect. Her behavior is normal.  Nursing note and vitals reviewed.    ED Treatments / Results  Labs (all labs ordered are listed, but only abnormal results are displayed) Labs Reviewed  COMPREHENSIVE METABOLIC PANEL - Abnormal; Notable for the following:       Result Value   Chloride 97 (*)    Glucose,  Bld 101 (*)    All other components within normal limits  CBC WITH DIFFERENTIAL/PLATELET - Abnormal; Notable for the following:    WBC 3.4 (*)    Neutro Abs 1.4 (*)    All other components within normal limits  MAGNESIUM    EKG  EKG Interpretation None       Radiology No results found.  Procedures Procedures (including critical care time)  Medications Ordered in ED Medications - No data to display   Initial Impression / Assessment and Plan / ED Course  I have reviewed the triage vital signs and the nursing notes.  Pertinent labs & imaging results that were available during my care of the patient were reviewed by me and  considered in my medical decision making (see chart for details).     Patient with history of POTS syndrome here for feeling poorly. She has no focal neurologic deficits on examination. No significant electrolyte abnormalities. Following IV fluid hydration she is feeling improved. Counseled pt on outpatient follow-up, home care, return precautions.  Final Clinical Impressions(s) / ED Diagnoses   Final diagnoses:  Generalized weakness  Neutropenia, unspecified type [facility])    New Prescriptions Discharge Medication List as of 09/27/2016 12:51 PM       Quintella Reichert, MD 09/28/16 1207

## 2016-09-27 NOTE — ED Triage Notes (Signed)
Pt with hx of POTS c/o weakness. Presents with note from specialist stating to tx with fluid bolus.

## 2016-09-27 NOTE — Discharge Instructions (Signed)
Your White Blood Cell count with 3.4 today, which is slightly low - please have this rechecked by your family doctor in the next week.

## 2016-10-06 ENCOUNTER — Encounter (HOSPITAL_BASED_OUTPATIENT_CLINIC_OR_DEPARTMENT_OTHER): Payer: Self-pay

## 2016-10-06 ENCOUNTER — Emergency Department (HOSPITAL_BASED_OUTPATIENT_CLINIC_OR_DEPARTMENT_OTHER)
Admission: EM | Admit: 2016-10-06 | Discharge: 2016-10-06 | Disposition: A | Discharge status: Home / Self Care | Payer: Medicare HMO | Attending: Emergency Medicine | Admitting: Emergency Medicine

## 2016-10-06 ENCOUNTER — Emergency Department (HOSPITAL_BASED_OUTPATIENT_CLINIC_OR_DEPARTMENT_OTHER): Payer: Medicare HMO

## 2016-10-06 DIAGNOSIS — R531 Weakness: Secondary | ICD-10-CM

## 2016-10-06 DIAGNOSIS — R05 Cough: Secondary | ICD-10-CM | POA: Diagnosis not present

## 2016-10-06 DIAGNOSIS — I10 Essential (primary) hypertension: Secondary | ICD-10-CM | POA: Insufficient documentation

## 2016-10-06 DIAGNOSIS — J208 Acute bronchitis due to other specified organisms: Secondary | ICD-10-CM | POA: Diagnosis not present

## 2016-10-06 DIAGNOSIS — R0602 Shortness of breath: Secondary | ICD-10-CM | POA: Diagnosis not present

## 2016-10-06 LAB — CBC WITH DIFFERENTIAL/PLATELET
BASOS ABS: 0 10*3/uL (ref 0.0–0.1)
BASOS PCT: 1 %
Eosinophils Absolute: 0.2 10*3/uL (ref 0.0–0.7)
Eosinophils Relative: 3 %
HEMATOCRIT: 38.2 % (ref 36.0–46.0)
Hemoglobin: 12.8 g/dL (ref 12.0–15.0)
LYMPHS PCT: 18 %
Lymphs Abs: 1.2 10*3/uL (ref 0.7–4.0)
MCH: 33.2 pg (ref 26.0–34.0)
MCHC: 33.5 g/dL (ref 30.0–36.0)
MCV: 99 fL (ref 78.0–100.0)
Monocytes Absolute: 0.7 10*3/uL (ref 0.1–1.0)
Monocytes Relative: 11 %
NEUTROS ABS: 4.3 10*3/uL (ref 1.7–7.7)
Neutrophils Relative %: 67 %
PLATELETS: 203 10*3/uL (ref 150–400)
RBC: 3.86 MIL/uL — AB (ref 3.87–5.11)
RDW: 12.4 % (ref 11.5–15.5)
WBC: 6.4 10*3/uL (ref 4.0–10.5)

## 2016-10-06 LAB — BASIC METABOLIC PANEL
ANION GAP: 7 (ref 5–15)
BUN: 7 mg/dL (ref 6–20)
CO2: 29 mmol/L (ref 22–32)
Calcium: 8.8 mg/dL — ABNORMAL LOW (ref 8.9–10.3)
Chloride: 102 mmol/L (ref 101–111)
Creatinine, Ser: 0.53 mg/dL (ref 0.44–1.00)
GLUCOSE: 111 mg/dL — AB (ref 65–99)
POTASSIUM: 3.6 mmol/L (ref 3.5–5.1)
Sodium: 138 mmol/L (ref 135–145)

## 2016-10-06 MED ORDER — SODIUM CHLORIDE 0.9 % IV BOLUS (SEPSIS)
1000.0000 mL | Freq: Once | INTRAVENOUS | Status: AC
  Administered 2016-10-06: 1000 mL via INTRAVENOUS

## 2016-10-06 NOTE — ED Triage Notes (Signed)
Pt reports feeling "bad" since Saturday. Cough and weakness. History of POTS. Reports husband has been sick.

## 2016-10-06 NOTE — ED Notes (Signed)
Pt returned from XR. Cardiac monitor reapplied to patient.

## 2016-10-06 NOTE — ED Notes (Signed)
Pt transported to XR at this time via wheelchair. 
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
## 2016-10-06 NOTE — ED Notes (Signed)
ED Provider at bedside. 

## 2016-10-06 NOTE — ED Notes (Signed)
Pt verbalized understanding of discharge instructions and denies any further questions at this time.   

## 2016-10-08 ENCOUNTER — Encounter (HOSPITAL_BASED_OUTPATIENT_CLINIC_OR_DEPARTMENT_OTHER): Payer: Self-pay

## 2016-10-08 ENCOUNTER — Emergency Department (HOSPITAL_BASED_OUTPATIENT_CLINIC_OR_DEPARTMENT_OTHER)
Admission: EM | Admit: 2016-10-08 | Discharge: 2016-10-09 | Disposition: A | Discharge status: Home / Self Care | Payer: Medicare HMO | Attending: Emergency Medicine | Admitting: Emergency Medicine

## 2016-10-08 DIAGNOSIS — J0100 Acute maxillary sinusitis, unspecified: Secondary | ICD-10-CM

## 2016-10-08 DIAGNOSIS — I10 Essential (primary) hypertension: Secondary | ICD-10-CM

## 2016-10-08 DIAGNOSIS — Z79899 Other long term (current) drug therapy: Secondary | ICD-10-CM | POA: Insufficient documentation

## 2016-10-08 DIAGNOSIS — R51 Headache: Secondary | ICD-10-CM | POA: Diagnosis present

## 2016-10-08 LAB — CBC
HEMATOCRIT: 36.4 % (ref 36.0–46.0)
HEMOGLOBIN: 12.1 g/dL (ref 12.0–15.0)
MCH: 33 pg (ref 26.0–34.0)
MCHC: 33.2 g/dL (ref 30.0–36.0)
MCV: 99.2 fL (ref 78.0–100.0)
Platelets: 244 10*3/uL (ref 150–400)
RBC: 3.67 MIL/uL — ABNORMAL LOW (ref 3.87–5.11)
RDW: 12 % (ref 11.5–15.5)
WBC: 7.2 10*3/uL (ref 4.0–10.5)

## 2016-10-08 LAB — BASIC METABOLIC PANEL
ANION GAP: 7 (ref 5–15)
BUN: 12 mg/dL (ref 6–20)
CHLORIDE: 98 mmol/L — AB (ref 101–111)
CO2: 28 mmol/L (ref 22–32)
CREATININE: 0.7 mg/dL (ref 0.44–1.00)
Calcium: 8.8 mg/dL — ABNORMAL LOW (ref 8.9–10.3)
GFR calc non Af Amer: >60 mL/min (ref >60)
Glucose, Bld: 114 mg/dL — ABNORMAL HIGH (ref 65–99)
Potassium: 3.5 mmol/L (ref 3.5–5.1)
Sodium: 133 mmol/L — ABNORMAL LOW (ref 135–145)

## 2016-10-08 MED ORDER — SODIUM CHLORIDE 0.9 % IV BOLUS (SEPSIS)
1000.0000 mL | Freq: Once | INTRAVENOUS | Status: AC
  Administered 2016-10-08: 1000 mL via INTRAVENOUS

## 2016-10-08 NOTE — ED Triage Notes (Addendum)
C/o elevated BP yesterday and today-recent viral infection dx-also c/o HA, "sinus congestion", left ear fullness-NAD-steady gait

## 2016-10-08 NOTE — ED Notes (Addendum)
Pt states that she is dehydrated and needs fluids. States that she has an infection and also needs abx. States that she took an abx from previous illness while in waiting room and is already feeling better

## 2016-10-08 NOTE — ED Provider Notes (Signed)
[facility] DE[facility] Provider Note   CSN: [id number] Arrival date & time: 10/08/16  2014  By signing my name below, I, Reola Mosher, attest that this documentation has been prepared under the direction and in the presence of Alfonzo Beers, MD. Electronically Signed: Reola Mosher, ED Scribe. 10/08/16. 11:11 PM.  History   Chief Complaint Chief Complaint  Patient presents with  . Hypertension   The history is provided by the patient. No language interpreter was used.   HPI Comments: Katie [name] is a 61 y.o. female with a PMHx of chronic fatigue syndrome, fibromyalgia, MCAS, lyme disease, and POTS, who presents to the Emergency Department complaining of persistent, generalized headache beginning earlier this evening. Pt reports that over the past four days she has been sick with cough, rhinorrhea, and congestion. She was seen in the ED two days ago for this and she was dx'd w/ a viral URI; since then her she has had some new sinus pressure; however, today after taking a nap she woke up with a generalized, throbbing headache and left ear pain. She took her BP at that time and she states that it was elevated at that time, prompting her to come into the ED. Pt states that her BP tends to be elevated when she sick, but she has never been on medications for this previously. Pt was also taking a course of Augmentin last month. She denies fever, or any other associated symptoms.    Past Medical History:  Diagnosis Date  . Anxiety   . Fibromyalgia   . Hypertension   . Hyponatremia   . Lyme disease   . Lyme disease   . Mast cell activation syndrome ([place])   . POTS (postural orthostatic tachycardia syndrome)    Patient Active Problem List   Diagnosis Date Noted  . Diarrhea 11/14/2015  . Acute gastroenteritis 11/14/2015  . Dehydration   . Enteritis 11/13/2015  . Hyponatremia 11/13/2015  . Elevated blood pressure 07/14/2013  . Lyme disease 10/25/2012  . Chronic fatigue  syndrome 10/25/2012  . Anxiety and depression 10/25/2012  . Fibromyalgia 10/25/2012  . Hormone replacement therapy - followed by Dr. Lonia Skinner in g[place] and by [place] 10/25/2012   Past Surgical History:  Procedure Laterality Date  . Cataract Removal With Lens Implant Placement Left 03-06-2015   OB History    No data available     Home Medications    Prior to Admission medications   Medication Sig Start Date End Date Taking? Authorizing Provider  amoxicillin-clavulanate (AUGMENTIN) 875-125 MG tablet Take 1 tablet by mouth every 12 (twelve) hours. 10/08/16   Alfonzo Beers, MD  b complex vitamins tablet Take 1 tablet by mouth daily. B-SUPREME    [provider]  cetirizine (ZYRTEC ALLERGY) 10 MG tablet Take 10 mg by mouth daily.    [provider]  Cholecalciferol (VITAMIN D-3) 5000 UNITS TABS Take 5,000 tablets by mouth daily.    [provider]  Coenzyme Q10 (CO Q10 PO) Take 400 mg by mouth daily.    [provider]  CYANOCOBALAMIN-METHYLCOBALAMIN SL Inject 1 mL as directed every 7 (seven) days.     [provider]  folic acid (FOLVITE) 1 MG tablet Take 1 mg by mouth daily.    [provider]  KETOTIFEN FUMARATE OP Apply to eye.    [provider]  montelukast (SINGULAIR) 10 MG tablet Take 10 mg by mouth at bedtime.    [provider]  Nutritional Supplements (RESTORE-X PO) Take by mouth.    [provider]  Omega-3 Fatty Acids (FISH OIL PO) Take 2 capsules by mouth 2 (two) times daily. BARLEANS FISH OIL    [provider]  OVER THE COUNTER MEDICATION Take 2 capsules by mouth daily. S-Acetyl Glutathione (SYNERGY)    [provider]  OVER THE COUNTER MEDICATION Take 325 mg by mouth 2 (two) times daily. NATURAL CALM (magnesium carbonate and citric acid)-IONIC MAGNESIUM CITRATE    [provider]  Probiotic Product (PROBIOTIC ADVANCED PO) Take 1 capsule by mouth 2  (two) times daily. 100 BILLION    [provider]  QUERCETIN PO Take by mouth.    [provider]  ranitidine (ZANTAC) 150 MG capsule Take 150 mg by mouth 2 (two) times daily.    [provider]  THEANINE PO Take by mouth.    [provider]    Family History Family History  Problem Relation Age of Onset  . Heart disease Mother        CHF  . Hypertension Mother   . Cancer Father 54       lung cancer  . Hypertension Paternal Grandmother   . Stroke Paternal Grandmother   . Hypertension Paternal Grandfather    Social History Social History  Substance Use Topics  . Smoking status: Never Smoker  . Smokeless tobacco: Never Used  . Alcohol use Yes     Comment: Ocaasional glass of wine   Allergies   Azithromycin; Prednisone; Sulfa antibiotics; Sulfacetamide sodium; and Wheat bran  Review of Systems Review of Systems  All other systems reviewed and are negative.  Physical Exam Updated Vital Signs BP (!) 178/100 Comment: Dr. Canary Brim aware  Pulse 79   Temp 98.9 F (37.2 C) (Oral)   Resp 16   Ht 5\' 7"  (1.702 m)   Wt 64.9 kg (143 lb)   LMP 03/15/2013   SpO2 98%   BMI 22.40 kg/m  Vitals reviewed Physical Exam  Physical Examination: General appearance - alert, well appearing, and in no distress Mental status - alert, oriented to person, place, and time Eyes - pupils equal and reactive, extraocular eye movements intact Ears- left TM with clear fluid behind, no erythema Face- sinus tenderness to palpation over maxillary sinuses Mouth - mucous membranes moist, pharynx normal without lesions Chest - clear to auscultation, no wheezes, rales or rhonchi, symmetric air entry Heart - normal rate, regular rhythm, normal S1, S2, no murmurs, rubs, clicks or gallops Abdomen - soft, nontender, nondistended, no masses or organomegaly Neurological - alert, oriented, normal speech Extremities - peripheral pulses normal, no pedal edema, no clubbing or  cyanosis Skin - normal coloration and turgor, no rashes ED Treatments / Results  DIAGNOSTIC STUDIES: Oxygen Saturation is 98% on RA, normal by my interpretation.   COORDINATION OF CARE: 11:11 PM-Discussed next steps with pt. Pt verbalized understanding and is agreeable with the plan.   Labs (all labs ordered are listed, but only abnormal results are displayed) Labs Reviewed  CBC - Abnormal; Notable for the following:       Result Value   RBC 3.67 (*)    All other components within normal limits  BASIC METABOLIC PANEL - Abnormal; Notable for the following:    Sodium 133 (*)    Chloride 98 (*)    Glucose, Bld 114 (*)    Calcium 8.8 (*)    All other components within normal limits    EKG  EKG Interpretation  None      Radiology No results found.  Procedures Procedures   Medications Ordered in ED Medications  sodium chloride 0.9 % bolus 1,000 mL (0 mLs Intravenous Stopped 10/09/16 0000)    Initial Impression / Assessment and Plan / ED Course  I have reviewed the triage vital signs and the nursing notes.  Pertinent labs & imaging results that were available during my care of the patient were reviewed by me and considered in my medical decision making (see chart for details).     Pt presenting with concern for possible ear infection or sinusitis. She was seen in the ED recently and diagnosed with an ear infection.  She states her BP is elevated and this usually means that she is dehydrated and that she has a bacterial infection.  She does have sinus tenderness, fluid behind left TM.  Will start on augmentin for sinusitis.  Pt  Given IV fluids due to her concern for dehydration.  Discharged with strict return precautions.  Pt agreeable with plan.  Final Clinical Impressions(s) / ED Diagnoses   Final diagnoses:  Acute maxillary sinusitis, recurrence not specified  Hypertension, unspecified type   New Prescriptions Discharge Medication List as of 10/09/2016 12:01 AM      I personally performed the services described in this documentation, which was scribed in my presence. The recorded information has been reviewed and is accurate.      Alfonzo Beers, MD 10/09/16 [phone]

## 2016-10-09 MED ORDER — AMOXICILLIN-POT CLAVULANATE 875-125 MG PO TABS: 1.0000 | ORAL_TABLET | Freq: Two times a day (BID) | ORAL | 0 refills | Status: DC

## 2016-10-09 NOTE — ED Notes (Signed)
Pt ambulatory to bathroom without difficulty.  

## 2016-10-09 NOTE — Discharge Instructions (Signed)
Return to the ED with any concerns including chest pain, difficulty breathing, vomting and not able to keep down liquids or antibitoics, fainting, decreased level of alertness/lethargy, or any other alarming symptoms

## 2016-10-13 DIAGNOSIS — J329 Chronic sinusitis, unspecified: Secondary | ICD-10-CM | POA: Diagnosis not present

## 2016-10-27 ENCOUNTER — Ambulatory Visit: Payer: Medicare HMO | Admitting: Family Medicine

## 2016-10-28 ENCOUNTER — Encounter: Payer: Self-pay | Admitting: Family Medicine

## 2016-10-28 ENCOUNTER — Ambulatory Visit (INDEPENDENT_AMBULATORY_CARE_PROVIDER_SITE_OTHER): Payer: Medicare HMO | Admitting: Family Medicine

## 2016-10-28 VITALS — BP 140/90 | HR 86 | Temp 98.8°F | Wt 144.6 lb

## 2016-10-28 DIAGNOSIS — R5383 Other fatigue: Secondary | ICD-10-CM | POA: Diagnosis not present

## 2016-10-28 DIAGNOSIS — E871 Hypo-osmolality and hyponatremia: Secondary | ICD-10-CM

## 2016-10-28 DIAGNOSIS — J329 Chronic sinusitis, unspecified: Secondary | ICD-10-CM

## 2016-10-28 MED ORDER — ALPRAZOLAM 0.5 MG PO TABS: 0.5000 mg | ORAL_TABLET | Freq: Two times a day (BID) | ORAL | 0 refills | Status: DC | PRN

## 2016-10-28 NOTE — Patient Instructions (Signed)
Monitor blood pressure and be in touch with readings in about 3-4 weeks Consider elevate head of bed about 5-6 inches Consider OTC Zantac 150 mg or Pepcid 20 mg  Hydrate starting early in mornings.

## 2016-10-28 NOTE — Progress Notes (Signed)
Subjective:     Patient ID: Katie [name], female   DOB: [birth year], 61 y.o.   MRN: [id number]  HPI Patient has chronic problems including reported history of Lyme disease, recurrent mild hyponatremia, fibromyalgia, hypertension, chronic fatigue syndrome. She has been followed by naturopathic provider in [facility] is on multiple supplements. She's been told that she has mass cell destabilization syndrome. She has episodes of extreme weakness and blood pressure instability.  She's had multiple ER visits for various things including couple recent visits for percent sinusitis. She saw ENT recently. She was treated most recently June 6 with Augmentin. She presents now also recurrent sinus congestive symptoms. Her fatigue is relatively chronic. No purulent secretions. No fever.  History of recurrent mild hyponatremia. No thiazide use. She has seen endocrinologist and has had studies at that point which were revealing. No excessive free water consumption  She does have occasional GERD symptoms. She has taken acids reducers somewhat inconsistently in the past. Intermittent dry cough.  Past Medical History:  Diagnosis Date  . Anxiety   . Fibromyalgia   . Hypertension   . Hyponatremia   . Lyme disease   . Lyme disease   . Mast cell activation syndrome ([place])   . POTS (postural orthostatic tachycardia syndrome)    Past Surgical History:  Procedure Laterality Date  . Cataract Removal With Lens Implant Placement Left 03-06-2015    reports that she has never smoked. She has never used smokeless tobacco. She reports that she drinks alcohol. She reports that she does not use drugs. family history includes Cancer (age of onset: 42) in her father; Heart disease in her mother; Hypertension in her mother, paternal grandfather, and paternal grandmother; Stroke in her paternal grandmother. Allergies  Allergen Reactions  . Azithromycin Diarrhea  . Prednisone Other (See Comments)    Was told not to use  because of Lymes disease. States she also cannot use steroid drops.   . Sulfa Antibiotics     Caused white count to decrease  . Sulfacetamide Sodium     Caused white count to decrease  . Wheat Bran Other (See Comments)     Review of Systems  Constitutional: Positive for fatigue. Negative for chills and fever.  HENT: Positive for congestion and sinus pressure.   Respiratory: Negative for wheezing.   Cardiovascular: Negative for chest pain, palpitations and leg swelling.  Gastrointestinal: Negative for abdominal pain, nausea and vomiting.  Genitourinary: Negative for dysuria.  Skin: Negative for rash.       Objective:   Physical Exam  Constitutional: She appears well-developed and well-nourished.  HENT:  Right Ear: External ear normal.  Left Ear: External ear normal.  Nose: Nose normal.  Mouth/Throat: Oropharynx is clear and moist. No oropharyngeal exudate.  Neck: Neck supple.  Cardiovascular: Normal rate and regular rhythm.   Pulmonary/Chest: Effort normal and breath sounds normal. No respiratory distress. She has no wheezes. She has no rales.  Musculoskeletal: She exhibits no edema.  Lymphadenopathy:    She has no cervical adenopathy.       Assessment:     #1 Patient has long history of recurrent fatigue and question of Lyme disease. She has seen several nephropathic providers in the past and continues to see someone in [place] and is on multiple supplements  #2 Recurrent sinusitis symptoms. Question silent GERD  #3 Recurrent hyponatremia    Plan:     -We discussed measures to reduce GERD including dietary modification -Consider elevate head of bed about  6 inches -Avoid eating within 3 hours of bedtime -Consider over-the-counter Zantac or Pepcid twice daily -Recheck basic metabolic panel -No clear indication for repeat antibiotics at this time  Eulas Post MD [facility] at [facility]

## 2016-10-29 LAB — BASIC METABOLIC PANEL
BUN: 14 mg/dL (ref 6–23)
CALCIUM: 9.6 mg/dL (ref 8.4–10.5)
CHLORIDE: 101 meq/L (ref 96–112)
CO2: 31 mEq/L (ref 19–32)
CREATININE: 0.73 mg/dL (ref 0.40–1.20)
GFR: 85.89 mL/min (ref >60.00)
Glucose, Bld: 94 mg/dL (ref 70–99)
Potassium: 3.9 mEq/L (ref 3.5–5.1)
Sodium: 138 mEq/L (ref 135–145)

## 2016-10-31 DIAGNOSIS — L03032 Cellulitis of left toe: Secondary | ICD-10-CM | POA: Diagnosis not present

## 2016-12-09 ENCOUNTER — Ambulatory Visit: Payer: Medicare HMO | Admitting: Family Medicine

## 2016-12-17 DIAGNOSIS — R531 Weakness: Secondary | ICD-10-CM | POA: Diagnosis not present

## 2016-12-19 ENCOUNTER — Telehealth: Payer: Self-pay | Admitting: Family Medicine

## 2016-12-19 MED ORDER — ALPRAZOLAM 0.5 MG PO TABS: 0.5000 mg | ORAL_TABLET | Freq: Two times a day (BID) | ORAL | 0 refills | Status: DC | PRN

## 2016-12-19 NOTE — Telephone Encounter (Signed)
rx called in and patient is aware 

## 2016-12-19 NOTE — Telephone Encounter (Signed)
Pt is calling to check the status of the Rx and need it so that she can get through the next two weeks.  Pt state that she really needs it so that she can make the trip back home this weekend and has not slept for the past week and the pharmacy will be closing at 8pm in [place].

## 2016-12-19 NOTE — Telephone Encounter (Signed)
Last refill and office visit 10/28/16.  Okay to fill?

## 2016-12-19 NOTE — Telephone Encounter (Signed)
Refill once.  We have discussed really want her to taper off completely.

## 2016-12-19 NOTE — Telephone Encounter (Signed)
Pt will be back home on Monday. Pt needs the medication today  to help with long drive back [place]

## 2016-12-19 NOTE — Telephone Encounter (Signed)
Pt request refill  ALPRAZolam (XANAX) 0.5 MG tablet  Pt is visiting her daughter in [place] and has run out.  Pt would like to have it called into pharmacy in [place] today please.  Pt is in [place], [place] [phone]

## 2016-12-25 DIAGNOSIS — R69 Illness, unspecified: Secondary | ICD-10-CM | POA: Diagnosis not present

## 2017-01-01 DIAGNOSIS — R69 Illness, unspecified: Secondary | ICD-10-CM | POA: Diagnosis not present

## 2017-01-02 ENCOUNTER — Ambulatory Visit: Payer: Medicare HMO | Admitting: Family Medicine

## 2017-01-06 ENCOUNTER — Ambulatory Visit (INDEPENDENT_AMBULATORY_CARE_PROVIDER_SITE_OTHER): Payer: Medicare HMO | Admitting: Family Medicine

## 2017-01-06 ENCOUNTER — Encounter: Payer: Self-pay | Admitting: Family Medicine

## 2017-01-06 VITALS — BP 120/86 | HR 83 | Temp 98.4°F | Wt 155.6 lb

## 2017-01-06 DIAGNOSIS — R030 Elevated blood-pressure reading, without diagnosis of hypertension: Secondary | ICD-10-CM

## 2017-01-06 NOTE — Progress Notes (Signed)
Subjective:     Patient ID: Katie [name], female   DOB: Apr 17, 1956, 61 y.o.   MRN: [id number]  HPI   Patient here with concerns for elevated blood pressure. She's had several readings between 382 and 505 systolic and between 80 and 90 diastolic. She has been followed by a provider down in [facility] for reported Lyme disease and diagnosed with "mast cell destabilization syndrome". She started a new rx- ketotifen recently and she attributes recent weight gain to that. She states this has slowed her metabolism. Never treated for hypertension. No regular alcohol use. She states that overall she feels better than she has in years  Past Medical History:  Diagnosis Date  . Anxiety   . Fibromyalgia   . Hypertension   . Hyponatremia   . Lyme disease   . Lyme disease   . Mast cell activation syndrome ([place])   . POTS (postural orthostatic tachycardia syndrome)    Past Surgical History:  Procedure Laterality Date  . Cataract Removal With Lens Implant Placement Left 03-06-2015    reports that she has never smoked. She has never used smokeless tobacco. She reports that she drinks alcohol. She reports that she does not use drugs. family history includes Cancer (age of onset: 29) in her father; Heart disease in her mother; Hypertension in her mother, paternal grandfather, and paternal grandmother; Stroke in her paternal grandmother. Allergies  Allergen Reactions  . Azithromycin Diarrhea  . Prednisone Other (See Comments)    Was told not to use because of Lymes disease. States she also cannot use steroid drops.   . Sulfa Antibiotics     Caused white count to decrease  . Sulfacetamide Sodium     Caused white count to decrease  . Wheat Bran Other (See Comments)     Review of Systems  Constitutional: Negative for fatigue.  Eyes: Negative for visual disturbance.  Respiratory: Negative for cough, chest tightness, shortness of breath and wheezing.   Cardiovascular: Negative for chest pain,  palpitations and leg swelling.  Neurological: Negative for dizziness, seizures, syncope, weakness, light-headedness and headaches.       Objective:   Physical Exam  Constitutional: She appears well-developed and well-nourished.  Eyes: Pupils are equal, round, and reactive to light.  Neck: Neck supple. No JVD present. No thyromegaly present.  Cardiovascular: Normal rate and regular rhythm.  Exam reveals no gallop.   Pulmonary/Chest: Effort normal and breath sounds normal. No respiratory distress. She has no wheezes. She has no rales.  Musculoskeletal: She exhibits no edema.  Neurological: She is alert.       Assessment:     Elevated blood pressure without diagnosis of hypertension. Repeat left arm seated after rest 128/78    Plan:     -We recommend observation for now and be in touch if consistent readings greater than 397 systolic or 90 diastolic -She is encouraged try to lose a few pounds that she gained recently  Eulas Post MD [facility]

## 2017-01-06 NOTE — Patient Instructions (Signed)
Let's monitor blood pressure and be in touch if consistently > 140/90 Try to lose some weight, as discussed.

## 2017-01-23 DIAGNOSIS — R69 Illness, unspecified: Secondary | ICD-10-CM | POA: Diagnosis not present

## 2017-02-02 ENCOUNTER — Other Ambulatory Visit: Payer: Self-pay | Admitting: Family Medicine

## 2017-02-02 NOTE — Telephone Encounter (Signed)
Last refill 12/19/16 and last office visit 01/06/17.  Okay to fill?

## 2017-02-02 NOTE — Telephone Encounter (Signed)
Would really like for her to get away from prn use of Xanax.  We had discussed previously concerns for cognitive decline with long term use.

## 2017-02-03 ENCOUNTER — Encounter: Payer: Self-pay | Admitting: Family Medicine

## 2017-02-03 ENCOUNTER — Ambulatory Visit (INDEPENDENT_AMBULATORY_CARE_PROVIDER_SITE_OTHER): Payer: Medicare HMO | Admitting: Family Medicine

## 2017-02-03 VITALS — BP 112/80 | HR 78 | Temp 98.3°F | Wt 157.3 lb

## 2017-02-03 DIAGNOSIS — J0101 Acute recurrent maxillary sinusitis: Secondary | ICD-10-CM | POA: Diagnosis not present

## 2017-02-03 MED ORDER — ALPRAZOLAM 0.5 MG PO TABS: 0.5000 mg | ORAL_TABLET | Freq: Two times a day (BID) | ORAL | 0 refills | Status: DC | PRN

## 2017-02-03 MED ORDER — AMOXICILLIN-POT CLAVULANATE 875-125 MG PO TABS: 1.0000 | ORAL_TABLET | Freq: Two times a day (BID) | ORAL | 0 refills | Status: DC

## 2017-02-03 NOTE — Progress Notes (Signed)
Subjective:     Patient ID: Katie [name], female   DOB: Sep 09, 1955, 61 y.o.   MRN: [id number]  HPI Patient seen with concern for recurrent sinusitis. She's had frequent sinus infections in the past. Last week she started having some fatigue and pressure left maxillary sinus region. Progressive over the weekend. On Sunday noted some blood-tinged nasal mucus. She had leftover Augmentin and took 2 doses is that she feels slightly better. She also tried saline nasal irrigation. No fever. No cough. She is getting ready to go out of town on vacation and was concerned.    She has reported mast cell activation syndrome and is followed by specialist in [facility] for that.  Past Medical History:  Diagnosis Date  . Anxiety   . Fibromyalgia   . Hypertension   . Hyponatremia   . Lyme disease   . Lyme disease   . Mast cell activation syndrome ([place])   . POTS (postural orthostatic tachycardia syndrome)    Past Surgical History:  Procedure Laterality Date  . Cataract Removal With Lens Implant Placement Left 03-06-2015    reports that she has never smoked. She has never used smokeless tobacco. She reports that she drinks alcohol. She reports that she does not use drugs. family history includes Cancer (age of onset: 77) in her father; Heart disease in her mother; Hypertension in her mother, paternal grandfather, and paternal grandmother; Stroke in her paternal grandmother. Allergies  Allergen Reactions  . Azithromycin Diarrhea  . Prednisone Other (See Comments)    Was told not to use because of Lymes disease. States she also cannot use steroid drops.   . Sulfa Antibiotics     Caused white count to decrease  . Sulfacetamide Sodium     Caused white count to decrease  . Wheat Bran Other (See Comments)     Review of Systems  Constitutional: Positive for fatigue.  HENT: Positive for congestion, sinus pain and sinus pressure.   Respiratory: Negative for cough.        Objective:   Physical Exam   Constitutional: She appears well-developed and well-nourished.  HENT:  Right Ear: External ear normal.  Left Ear: External ear normal.  Mouth/Throat: Oropharynx is clear and moist.  Neck: Neck supple.  Cardiovascular: Normal rate and regular rhythm.   Pulmonary/Chest: Effort normal and breath sounds normal. No respiratory distress. She has no wheezes. She has no rales.  Lymphadenopathy:    She has no cervical adenopathy.       Assessment:     Probable acute sinusitis left maxillary sinus    Plan:     -Continue saline nasal irrigation -Complete course of Augmentin 875 mg twice daily for 10 days -Follow-up for any persistent symptoms  Eulas Post MD [facility]

## 2017-02-22 ENCOUNTER — Emergency Department (HOSPITAL_BASED_OUTPATIENT_CLINIC_OR_DEPARTMENT_OTHER)
Admission: EM | Admit: 2017-02-22 | Discharge: 2017-02-22 | Disposition: A | Discharge status: Home / Self Care | Payer: Medicare HMO | Attending: Emergency Medicine | Admitting: Emergency Medicine

## 2017-02-22 ENCOUNTER — Encounter (HOSPITAL_BASED_OUTPATIENT_CLINIC_OR_DEPARTMENT_OTHER): Payer: Self-pay | Admitting: Emergency Medicine

## 2017-02-22 DIAGNOSIS — Z79899 Other long term (current) drug therapy: Secondary | ICD-10-CM | POA: Diagnosis not present

## 2017-02-22 DIAGNOSIS — R5383 Other fatigue: Secondary | ICD-10-CM | POA: Insufficient documentation

## 2017-02-22 DIAGNOSIS — I10 Essential (primary) hypertension: Secondary | ICD-10-CM | POA: Diagnosis not present

## 2017-02-22 LAB — CBC WITH DIFFERENTIAL/PLATELET
Basophils Absolute: 0 10*3/uL (ref 0.0–0.1)
Basophils Relative: 1 %
Eosinophils Absolute: 0.2 10*3/uL (ref 0.0–0.7)
Eosinophils Relative: 6 %
HCT: 40.6 % (ref 36.0–46.0)
Hemoglobin: 13.5 g/dL (ref 12.0–15.0)
Lymphocytes Relative: 35 %
Lymphs Abs: 1.4 10*3/uL (ref 0.7–4.0)
MCH: 32.4 pg (ref 26.0–34.0)
MCHC: 33.3 g/dL (ref 30.0–36.0)
MCV: 97.4 fL (ref 78.0–100.0)
Monocytes Absolute: 0.4 10*3/uL (ref 0.1–1.0)
Monocytes Relative: 11 %
Neutro Abs: 1.9 10*3/uL (ref 1.7–7.7)
Neutrophils Relative %: 47 %
Platelets: 240 10*3/uL (ref 150–400)
RBC: 4.17 MIL/uL (ref 3.87–5.11)
RDW: 12 % (ref 11.5–15.5)
WBC: 3.9 10*3/uL — ABNORMAL LOW (ref 4.0–10.5)

## 2017-02-22 LAB — BASIC METABOLIC PANEL
Anion gap: 6 (ref 5–15)
BUN: 11 mg/dL (ref 6–20)
CO2: 29 mmol/L (ref 22–32)
Calcium: 8.9 mg/dL (ref 8.9–10.3)
Chloride: 103 mmol/L (ref 101–111)
Creatinine, Ser: 0.69 mg/dL (ref 0.44–1.00)
GFR calc Af Amer: >60 mL/min (ref >60)
GFR calc non Af Amer: >60 mL/min (ref >60)
Glucose, Bld: 98 mg/dL (ref 65–99)
Potassium: 4.2 mmol/L (ref 3.5–5.1)
Sodium: 138 mmol/L (ref 135–145)

## 2017-02-22 MED ORDER — SODIUM CHLORIDE 0.9 % IV BOLUS (SEPSIS)
1000.0000 mL | Freq: Once | INTRAVENOUS | Status: AC
  Administered 2017-02-22: 1000 mL via INTRAVENOUS

## 2017-02-22 NOTE — Discharge Instructions (Signed)
Please read attached information. If you experience any new or worsening signs or symptoms please return to the emergency room for evaluation. Please follow-up with your primary care provider or specialist as discussed.  °
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
## 2017-02-22 NOTE — ED Triage Notes (Signed)
Pt w/ hx of Mast Cell Activation Syndrome, sts she is feeling fatigued and weak the last few days and feels like she is on the verge of having an attack.

## 2017-02-27 ENCOUNTER — Emergency Department (HOSPITAL_BASED_OUTPATIENT_CLINIC_OR_DEPARTMENT_OTHER): Payer: Medicare HMO

## 2017-02-27 ENCOUNTER — Encounter (HOSPITAL_BASED_OUTPATIENT_CLINIC_OR_DEPARTMENT_OTHER): Payer: Self-pay | Admitting: *Deleted

## 2017-02-27 ENCOUNTER — Emergency Department (HOSPITAL_BASED_OUTPATIENT_CLINIC_OR_DEPARTMENT_OTHER)
Admission: EM | Admit: 2017-02-27 | Discharge: 2017-02-27 | Disposition: A | Discharge status: Home / Self Care | Payer: Medicare HMO | Attending: Emergency Medicine | Admitting: Emergency Medicine

## 2017-02-27 DIAGNOSIS — Z79899 Other long term (current) drug therapy: Secondary | ICD-10-CM | POA: Diagnosis not present

## 2017-02-27 DIAGNOSIS — R109 Unspecified abdominal pain: Secondary | ICD-10-CM | POA: Diagnosis not present

## 2017-02-27 DIAGNOSIS — D8940 Mast cell activation, unspecified: Secondary | ICD-10-CM | POA: Insufficient documentation

## 2017-02-27 DIAGNOSIS — K5792 Diverticulitis of intestine, part unspecified, without perforation or abscess without bleeding: Secondary | ICD-10-CM | POA: Diagnosis not present

## 2017-02-27 DIAGNOSIS — M797 Fibromyalgia: Secondary | ICD-10-CM | POA: Insufficient documentation

## 2017-02-27 DIAGNOSIS — J322 Chronic ethmoidal sinusitis: Secondary | ICD-10-CM

## 2017-02-27 DIAGNOSIS — I10 Essential (primary) hypertension: Secondary | ICD-10-CM | POA: Diagnosis not present

## 2017-02-27 DIAGNOSIS — R1032 Left lower quadrant pain: Secondary | ICD-10-CM | POA: Diagnosis present

## 2017-02-27 LAB — COMPREHENSIVE METABOLIC PANEL
ALK PHOS: 73 U/L (ref 38–126)
ALT: 19 U/L (ref 14–54)
ANION GAP: 7 (ref 5–15)
AST: 23 U/L (ref 15–41)
Albumin: 4.1 g/dL (ref 3.5–5.0)
BILIRUBIN TOTAL: 1.3 mg/dL — AB (ref 0.3–1.2)
BUN: 9 mg/dL (ref 6–20)
CALCIUM: 9.1 mg/dL (ref 8.9–10.3)
CO2: 26 mmol/L (ref 22–32)
Chloride: 104 mmol/L (ref 101–111)
Creatinine, Ser: 0.74 mg/dL (ref 0.44–1.00)
GFR calc Af Amer: >60 mL/min (ref >60)
Glucose, Bld: 95 mg/dL (ref 65–99)
Potassium: 3.8 mmol/L (ref 3.5–5.1)
SODIUM: 137 mmol/L (ref 135–145)
TOTAL PROTEIN: 7.3 g/dL (ref 6.5–8.1)

## 2017-02-27 LAB — CBC WITH DIFFERENTIAL/PLATELET
BASOS PCT: 0 %
Basophils Absolute: 0 10*3/uL (ref 0.0–0.1)
EOS ABS: 0.1 10*3/uL (ref 0.0–0.7)
Eosinophils Relative: 2 %
HCT: 38.2 % (ref 36.0–46.0)
HEMOGLOBIN: 12.8 g/dL (ref 12.0–15.0)
Lymphocytes Relative: 20 %
Lymphs Abs: 1.7 10*3/uL (ref 0.7–4.0)
MCH: 32.2 pg (ref 26.0–34.0)
MCHC: 33.5 g/dL (ref 30.0–36.0)
MCV: 96 fL (ref 78.0–100.0)
Monocytes Absolute: 0.8 10*3/uL (ref 0.1–1.0)
Monocytes Relative: 10 %
NEUTROS PCT: 68 %
Neutro Abs: 5.9 10*3/uL (ref 1.7–7.7)
Platelets: 243 10*3/uL (ref 150–400)
RBC: 3.98 MIL/uL (ref 3.87–5.11)
RDW: 11.9 % (ref 11.5–15.5)
WBC: 8.5 10*3/uL (ref 4.0–10.5)

## 2017-02-27 LAB — URINALYSIS, ROUTINE W REFLEX MICROSCOPIC
BILIRUBIN URINE: NEGATIVE
GLUCOSE, UA: NEGATIVE mg/dL
KETONES UR: NEGATIVE mg/dL
Leukocytes, UA: NEGATIVE
Nitrite: NEGATIVE
PH: 6.5 (ref 5.0–8.0)
Protein, ur: NEGATIVE mg/dL

## 2017-02-27 LAB — URINALYSIS, MICROSCOPIC (REFLEX): WBC, UA: NONE SEEN WBC/hpf (ref 0–5)

## 2017-02-27 LAB — LIPASE, BLOOD: LIPASE: 21 U/L (ref 11–51)

## 2017-02-27 MED ORDER — HYDROCODONE-ACETAMINOPHEN 5-325 MG PO TABS: 1.0000 | ORAL_TABLET | Freq: Four times a day (QID) | ORAL | 0 refills | Status: DC | PRN

## 2017-02-27 MED ORDER — MORPHINE SULFATE (PF) 4 MG/ML IV SOLN: 4.0000 mg | Freq: Once | INTRAVENOUS | Status: DC

## 2017-02-27 MED ORDER — CIPROFLOXACIN HCL 500 MG PO TABS: 500.0000 mg | ORAL_TABLET | Freq: Two times a day (BID) | ORAL | 0 refills | Status: DC

## 2017-02-27 MED ORDER — AMOXICILLIN-POT CLAVULANATE 875-125 MG PO TABS: 1.0000 | ORAL_TABLET | Freq: Three times a day (TID) | ORAL | 0 refills | Status: DC

## 2017-02-27 MED ORDER — SODIUM CHLORIDE 0.9 % IV SOLN
1.5000 g | Freq: Once | INTRAVENOUS | Status: AC
  Administered 2017-02-27: 1.5 g via INTRAVENOUS
  Filled 2017-02-27: qty 1.5

## 2017-02-27 MED ORDER — IOPAMIDOL (ISOVUE-300) INJECTION 61%: 100.0000 mL | Freq: Once | INTRAVENOUS | Status: DC | PRN

## 2017-02-27 MED ORDER — ONDANSETRON HCL 4 MG/2ML IJ SOLN: 4.0000 mg | Freq: Once | INTRAMUSCULAR | Status: DC

## 2017-02-27 MED ORDER — METRONIDAZOLE 500 MG PO TABS: 500.0000 mg | ORAL_TABLET | Freq: Three times a day (TID) | ORAL | 0 refills | Status: DC

## 2017-02-27 MED ORDER — SODIUM CHLORIDE 0.9 % IV BOLUS (SEPSIS)
1000.0000 mL | Freq: Once | INTRAVENOUS | Status: AC
  Administered 2017-02-27: 1000 mL via INTRAVENOUS

## 2017-02-27 MED ORDER — DIPHENHYDRAMINE HCL 50 MG/ML IJ SOLN
12.5000 mg | Freq: Once | INTRAMUSCULAR | Status: AC
  Administered 2017-02-27: 12.5 mg via INTRAVENOUS
  Filled 2017-02-27: qty 1

## 2017-02-27 MED ORDER — ACETAMINOPHEN 500 MG PO TABS: 1000.0000 mg | ORAL_TABLET | Freq: Once | ORAL | Status: DC

## 2017-02-27 MED FILL — AMOX-CLAV 875-125 MG TABLET: 875-125 | 7 days supply | Qty: 21 | Fill #0

## 2017-02-27 MED FILL — HYDROCODON-APAP 5-325: 5-325 | 2 days supply | Qty: 7 | Fill #0

## 2017-02-27 NOTE — ED Notes (Signed)
Pt requesting xanax 

## 2017-02-27 NOTE — ED Provider Notes (Signed)
Medical screening examination/treatment/procedure(s) were conducted as a shared visit with non-physician practitioner(s) and myself.  I personally evaluated the patient during the encounter.   EKG Interpretation None       Results for orders placed or performed during the hospital encounter of 02/27/17  Comprehensive metabolic panel  Result Value Ref Range   Sodium 137 135 - 145 mmol/L   Potassium 3.8 3.5 - 5.1 mmol/L   Chloride 104 101 - 111 mmol/L   CO2 26 22 - 32 mmol/L   Glucose, Bld 95 65 - 99 mg/dL   BUN 9 6 - 20 mg/dL   Creatinine, Ser 0.74 0.44 - 1.00 mg/dL   Calcium 9.1 8.9 - 10.3 mg/dL   Total Protein 7.3 6.5 - 8.1 g/dL   Albumin 4.1 3.5 - 5.0 g/dL   AST 23 15 - 41 U/L   ALT 19 14 - 54 U/L   Alkaline Phosphatase 73 38 - 126 U/L   Total Bilirubin 1.3 (H) 0.3 - 1.2 mg/dL   GFR calc non Af Amer >60 >60 mL/min   GFR calc Af Amer >60 >60 mL/min   Anion gap 7 5 - 15  Lipase, blood  Result Value Ref Range   Lipase 21 11 - 51 U/L  CBC with Differential  Result Value Ref Range   WBC 8.5 4.0 - 10.5 K/uL   RBC 3.98 3.87 - 5.11 MIL/uL   Hemoglobin 12.8 12.0 - 15.0 g/dL   HCT 38.2 36.0 - 46.0 %   MCV 96.0 78.0 - 100.0 fL   MCH 32.2 26.0 - 34.0 pg   MCHC 33.5 30.0 - 36.0 g/dL   RDW 11.9 11.5 - 15.5 %   Platelets 243 150 - 400 K/uL   Neutrophils Relative % 68 %   Neutro Abs 5.9 1.7 - 7.7 K/uL   Lymphocytes Relative 20 %   Lymphs Abs 1.7 0.7 - 4.0 K/uL   Monocytes Relative 10 %   Monocytes Absolute 0.8 0.1 - 1.0 K/uL   Eosinophils Relative 2 %   Eosinophils Absolute 0.1 0.0 - 0.7 K/uL   Basophils Relative 0 %   Basophils Absolute 0.0 0.0 - 0.1 K/uL  Urinalysis, Routine w reflex microscopic  Result Value Ref Range   Color, Urine STRAW (A) YELLOW   APPearance CLEAR CLEAR   Specific Gravity, Urine <1.005 (L) 1.005 - 1.030   pH 6.5 5.0 - 8.0   Glucose, UA NEGATIVE NEGATIVE mg/dL   Hgb urine dipstick TRACE (A) NEGATIVE   Bilirubin Urine NEGATIVE NEGATIVE   Ketones,  ur NEGATIVE NEGATIVE mg/dL   Protein, ur NEGATIVE NEGATIVE mg/dL   Nitrite NEGATIVE NEGATIVE   Leukocytes, UA NEGATIVE NEGATIVE  Urinalysis, Microscopic (reflex)  Result Value Ref Range   RBC / HPF 0-5 0 - 5 RBC/hpf   WBC, UA NONE SEEN 0 - 5 WBC/hpf   Bacteria, UA RARE (A) NONE SEEN   Squamous Epithelial / LPF 0-5 (A) NONE SEEN   Ct Abdomen Pelvis Wo Contrast  Result Date: 02/27/2017 CLINICAL DATA:  Initial evaluation for acute abdominal pain, suspect gastroenteritis or colitis. EXAM: CT ABDOMEN AND PELVIS WITHOUT CONTRAST TECHNIQUE: Multidetector CT imaging of the abdomen and pelvis was performed following the standard protocol without IV contrast. COMPARISON:  Prior ultrasound from 09/11/2014. FINDINGS: Lower chest: Hazy subsegmental atelectatic changes present within the visualized lung bases. Visualized lungs otherwise clear. Hepatobiliary: Liver demonstrates a normal unenhanced appearance. Subtle hyperdensity within the gallbladder suggestive of sludge and/ or stones. No acute inflammatory  changes about the gallbladder. No biliary dilatation. Pancreas: Pancreas within normal limits. Spleen: Spleen within normal limits. Adrenals/Urinary Tract: Adrenal glands are normal. Kidneys equal in size without evidence for nephrolithiasis or hydronephrosis. No radiopaque calculi seen along the course of either renal collecting system. No hydroureter. Bladder within normal limits. Stomach/Bowel: Small hiatal hernia noted. Stomach otherwise unremarkable. No evidence for bowel obstruction. Appendix is normal. Hazy inflammatory stranding seen about multiple diverticula at the sigmoid colon, consistent with acute sigmoid diverticulitis (series 2, image 55). No evidence for perforation or other complication. No other acute inflammatory changes seen about the bowels. Vascular/Lymphatic: Intra-abdominal aorta of normal caliber. No adenopathy. Reproductive: Uterus and ovaries within normal limits. Other: No free air  or fluid. Musculoskeletal: No acute osseus abnormality. No worrisome lytic or blastic osseous lesions. Trace anterolisthesis of L4 on L5 with associated facet arthropathy noted. IMPRESSION: 1. Findings consistent with acute sigmoid diverticulitis. No complication identified. 2. No other acute intra-abdominal or pelvic process. 3. Probable subtle stones and/or sludge within the gallbladder lumen. No imaging findings to suggest acute cholecystitis. Electronically Signed   By: Jeannine Boga M.D.   On: 02/27/2017 15:30   Ct Maxillofacial Wo Cm  Result Date: 02/27/2017 CLINICAL DATA:  Initial evaluation for acute upper respiratory infection, sinusitis. EXAM: CT MAXILLOFACIAL WITHOUT CONTRAST TECHNIQUE: Multidetector CT imaging of the maxillofacial structures was performed. Multiplanar CT image reconstructions were also generated. COMPARISON:  Prior CT from 03/11/2016. FINDINGS: Osseous: No acute osseous abnormality seen about the visualized osseous structures. Orbits: Globes and orbital soft tissues within normal limits. Patient status post cataract extraction bilaterally. Sinuses: Left frontal sinus and frontoethmoidal recess are opacified. Anterior left ethmoidal air cells completely opacified as well. Mild scattered mucosal thickening within the right-sided ethmoidal air cells. Sphenoid sinuses are clear. Minimal mucoperiosteal thickening within the maxillary sinuses bilaterally. Paranasal sinuses otherwise clear. No air-fluid levels identified. Mastoid air cells and middle ear cavities are clear. Soft tissues: Unremarkable. Limited intracranial: Unremarkable. IMPRESSION: Left frontal ethmoidal sinusitis.  Otherwise clear sinuses. Electronically Signed   By: Jeannine Boga M.D.   On: 02/27/2017 15:22    Patient seen by me along with physician assistant.  Patient with acute onset of left lower quadrant abdominal pain today.  CT scan shows evidence of diverticulitis without any complicating factors.   Patient does not tolerate Cipro and Flagyl well.  She did have diverticulitis several years ago.  Patient insist on treatment with Augmentin.  She is aware that does not give complete coverage but since symptoms are just starting today it may be worth a try.  We will give a dose of Unasyn here IV prior to discharge.  Patient has primary care doctor to follow-up with.  She understands she should be better in 4 days she should not get worse if she starts getting worse she needs to get seen right away.  Abdomen nonsurgical.  Lungs clear heart regular.  Patient did present with upper respiratory symptoms for a week and did have some diarrhea today.  The big difference was that she had the left lower quadrant pain.   Fredia Sorrow, MD 02/27/17 1606

## 2017-02-27 NOTE — ED Notes (Signed)
Pt refusing morphine and "any contrast" , PA made aware

## 2017-02-27 NOTE — ED Notes (Signed)
Patient transported to CT 

## 2017-02-27 NOTE — ED Notes (Signed)
PT discharged to home with family. NAD. 

## 2017-02-27 NOTE — ED Triage Notes (Signed)
Pt c/o URi symptoms x 1 week and diarrhea today. Seen here Sunday for same

## 2017-02-27 NOTE — ED Notes (Signed)
Pt returned from CT °

## 2017-02-27 NOTE — ED Notes (Signed)
ED Provider at bedside. Family member came to desk states patient is having panic attack and wants meds and to talk to doc. Family also states she has her own xanax in her purse and wants to take it.

## 2017-02-27 NOTE — Discharge Instructions (Signed)
Your CT scan shows signs of diverticulitis.  Your CT scan of your head also showed some continued sinusitis whether this is acute or chronic we cannot tell.  This is typically treated with Cipro Flagyl however given your allergy will start you on Augmentin 3 times a day.  Have given you the prescription for Cipro and Flagyl if you would like to take this however I would avoid taking into you follow-up with your primary care doctor at the Augmentin is not working.  Have given you a short course of pain medicine to take at home as needed.  May also take Motrin.  Please follow-up with your primary care doctor for recheck.  Return to the ED if you develop any worsening symptoms.

## 2017-02-28 NOTE — ED Provider Notes (Signed)
[facility] EMERGENCY DEPARTMENT Provider Note   CSN: [id number] Arrival date & time: 02/27/17  1311     History   Chief Complaint Chief Complaint  Patient presents with  . URI    HPI Katie [name] is a 61 y.o. female.  HPI 61 year old Caucasian female past medical history significant for anxiety, fibromyalgia, hypertension, mass cell activation syndrome presents to the ED for evaluation of left lower quadrant abdominal pain, diarrhea and sinus pressure/congestion.  Patient states that her symptoms started approximately 1 week ago.  States that she was seen in the ED at that time and diagnosed with a sinus infection.  States that she was taking Augmentin and has had some relief of her sinus congestion/pain however her left lower quadrant abdominal pain has persisted.  Reports on and off nonbloody diarrhea.  Ports mild nausea but denies any emesis.  Denies any associated fever.  The patient states that she has mast cell activation syndrome and feels like she is getting a flare.  States she has a history of diverticulitis and this feels similar.  Denies any urinary symptoms.  She has not taken anything for her pain prior to arrival.  Palpation makes the pain worse.  Nothing makes the pain better.  States that she "knows she has an infection somewhere".  States that she just does not feel good.  Pt denies any fever, chill, ha, vision changes, lightheadedness, dizziness, congestion, neck pain, cp, sob, cough, emesis, urinary symptoms, melena, hematochezia, lower extremity paresthesias.  Past Medical History:  Diagnosis Date  . Anxiety   . Fibromyalgia   . Hypertension   . Hyponatremia   . Lyme disease   . Lyme disease   . Mast cell activation syndrome ([place])   . POTS (postural orthostatic tachycardia syndrome)     Patient Active Problem List   Diagnosis Date Noted  . Diarrhea 11/14/2015  . Acute gastroenteritis 11/14/2015  . Dehydration   . Enteritis 11/13/2015  .  Hyponatremia 11/13/2015  . Elevated blood pressure reading 07/14/2013  . Lyme disease 10/25/2012  . Chronic fatigue syndrome 10/25/2012  . Anxiety and depression 10/25/2012  . Fibromyalgia 10/25/2012  . Hormone replacement therapy - followed by Dr. Lonia Skinner in gyn and by [place] 10/25/2012    Past Surgical History:  Procedure Laterality Date  . Cataract Removal With Lens Implant Placement Left 03-06-2015    OB History    No data available       Home Medications    Prior to Admission medications   Medication Sig Start Date End Date Taking? Authorizing Provider  ALPRAZolam Duanne Moron) 0.5 MG tablet Take 1 tablet (0.5 mg total) by mouth 2 (two) times daily as needed for anxiety. 02/03/17   Burchette, Alinda Sierras, MD  amoxicillin-clavulanate (AUGMENTIN) 875-125 MG tablet Take 1 tablet by mouth 3 (three) times daily. 02/27/17   Doristine Devoid, PA-C  cetirizine (ZYRTEC ALLERGY) 10 MG tablet Take 10 mg by mouth daily.    [provider]  Cholecalciferol (VITAMIN D-3) 5000 UNITS TABS Take 5,000 tablets by mouth daily.    [provider]  ciprofloxacin (CIPRO) 500 MG tablet Take 1 tablet (500 mg total) by mouth 2 (two) times daily. 02/27/17   Doristine Devoid, PA-C  Coenzyme Q10 (CO Q10 PO) Take 400 mg by mouth daily.    [provider]  CYANOCOBALAMIN-METHYLCOBALAMIN SL Inject 1 mL as directed every 7 (seven) days.     [provider]  folic  acid (FOLVITE) 1 MG tablet Take 1 mg by mouth daily.    [provider]  HYDROcodone-acetaminophen (NORCO) 5-325 MG tablet Take 1 tablet by mouth every 6 (six) hours as needed for moderate pain. 02/27/17   Doristine Devoid, PA-C  Ketotifen Fumarate POWD Take by mouth.    [provider]  metroNIDAZOLE (FLAGYL) 500 MG tablet Take 1 tablet (500 mg total) by mouth 3 (three) times daily. DO NOT CONSUME ALCOHOL WHILE TAKING THIS MEDICATION. 02/27/17   Ocie Cornfield T, PA-C    montelukast (SINGULAIR) 10 MG tablet Take 10 mg by mouth at bedtime.    [provider]  Nutritional Supplements (RESTORE-X PO) Take by mouth.    [provider]  Omega-3 Fatty Acids (FISH OIL PO) Take 2 capsules by mouth 2 (two) times daily. BARLEANS FISH OIL    [provider]  OVER THE COUNTER MEDICATION Take 2 capsules by mouth daily. S-Acetyl Glutathione (SYNERGY)    [provider]  OVER THE COUNTER MEDICATION Take 325 mg by mouth 2 (two) times daily. NATURAL CALM (magnesium carbonate and citric acid)-IONIC MAGNESIUM CITRATE    [provider]  OVER THE COUNTER MEDICATION Equilib - 2 tabs twice daily    [provider]  Probiotic Product (PROBIOTIC ADVANCED PO) Take 1 capsule by mouth 2 (two) times daily. 100 BILLION    [provider]  QUERCETIN PO Take by mouth.    [provider]  THEANINE PO Take by mouth.    [provider]    Family History Family History  Problem Relation Age of Onset  . Heart disease Mother        CHF  . Hypertension Mother   . Cancer Father 8       lung cancer  . Hypertension Paternal Grandmother   . Stroke Paternal Grandmother   . Hypertension Paternal Grandfather     Social History Social History  Substance Use Topics  . Smoking status: Never Smoker  . Smokeless tobacco: Never Used  . Alcohol use Yes     Comment: Ocaasional glass of wine     Allergies   Azithromycin; Ciprofloxacin; Prednisone; Sulfa antibiotics; Sulfacetamide sodium; and Wheat bran   Review of Systems Review of Systems  Constitutional: Positive for fatigue. Negative for chills and fever.  HENT: Positive for sinus pain and sinus pressure. Negative for congestion.   Eyes: Negative for visual disturbance.  Respiratory: Negative for cough and shortness of breath.   Cardiovascular: Negative for chest pain.  Gastrointestinal: Positive for abdominal pain, diarrhea and nausea. Negative for  vomiting.  Genitourinary: Negative for dysuria, flank pain, frequency, hematuria and urgency.  Musculoskeletal: Negative for arthralgias and myalgias.  Skin: Negative for rash.  Neurological: Negative for dizziness, syncope, weakness, light-headedness, numbness and headaches.  Psychiatric/Behavioral: Negative for sleep disturbance. The patient is not nervous/anxious.      Physical Exam Updated Vital Signs BP (!) 145/78 (BP Location: Left Arm)   Pulse 88   Temp 98.6 F (37 C) (Oral)   Resp 18   Ht 5\' 7"  (1.702 m)   Wt 70.8 kg (156 lb)   LMP 03/15/2013   SpO2 100%   BMI 24.43 kg/m   Physical Exam  Constitutional: She is oriented to person, place, and time. She appears well-developed and well-nourished.  Non-toxic appearance. No distress.  HENT:  Head: Normocephalic and atraumatic.  Right Ear: Tympanic membrane, external ear and ear canal normal.  Left Ear: Tympanic membrane, external ear and  ear canal normal.  Nose: Right sinus exhibits maxillary sinus tenderness and frontal sinus tenderness. Left sinus exhibits maxillary sinus tenderness and frontal sinus tenderness.  Mouth/Throat: Uvula is midline, oropharynx is clear and moist and mucous membranes are normal.  Eyes: Pupils are equal, round, and reactive to light. Conjunctivae are normal. Right eye exhibits no discharge. Left eye exhibits no discharge.  Neck: Normal range of motion. Neck supple.  Cardiovascular: Normal rate, regular rhythm, normal heart sounds and intact distal pulses.   Pulmonary/Chest: Effort normal and breath sounds normal. No respiratory distress. She exhibits no tenderness.  Abdominal: Soft. Bowel sounds are normal. There is tenderness in the left lower quadrant. There is no rigidity, no rebound, no guarding, no CVA tenderness, no tenderness at McBurney's point and negative Murphy's sign.  Musculoskeletal: Normal range of motion. She exhibits no tenderness.  Lymphadenopathy:    She has no cervical  adenopathy.  Neurological: She is alert and oriented to person, place, and time.  Skin: Skin is warm and dry. Capillary refill takes less than 2 seconds.  Psychiatric: Her behavior is normal. Judgment and thought content normal.  Nursing note and vitals reviewed.    ED Treatments / Results  Labs (all labs ordered are listed, but only abnormal results are displayed) Labs Reviewed  COMPREHENSIVE METABOLIC PANEL - Abnormal; Notable for the following:       Result Value   Total Bilirubin 1.3 (*)    All other components within normal limits  URINALYSIS, ROUTINE W REFLEX MICROSCOPIC - Abnormal; Notable for the following:    Color, Urine STRAW (*)    Specific Gravity, Urine <1.005 (*)    Hgb urine dipstick TRACE (*)    All other components within normal limits  URINALYSIS, MICROSCOPIC (REFLEX) - Abnormal; Notable for the following:    Bacteria, UA RARE (*)    Squamous Epithelial / LPF 0-5 (*)    All other components within normal limits  LIPASE, BLOOD  CBC WITH DIFFERENTIAL/PLATELET    EKG  EKG Interpretation None       Radiology Ct Abdomen Pelvis Wo Contrast  Result Date: 02/27/2017 CLINICAL DATA:  Initial evaluation for acute abdominal pain, suspect gastroenteritis or colitis. EXAM: CT ABDOMEN AND PELVIS WITHOUT CONTRAST TECHNIQUE: Multidetector CT imaging of the abdomen and pelvis was performed following the standard protocol without IV contrast. COMPARISON:  Prior ultrasound from 09/11/2014. FINDINGS: Lower chest: Hazy subsegmental atelectatic changes present within the visualized lung bases. Visualized lungs otherwise clear. Hepatobiliary: Liver demonstrates a normal unenhanced appearance. Subtle hyperdensity within the gallbladder suggestive of sludge and/ or stones. No acute inflammatory changes about the gallbladder. No biliary dilatation. Pancreas: Pancreas within normal limits. Spleen: Spleen within normal limits. Adrenals/Urinary Tract: Adrenal glands are normal. Kidneys  equal in size without evidence for nephrolithiasis or hydronephrosis. No radiopaque calculi seen along the course of either renal collecting system. No hydroureter. Bladder within normal limits. Stomach/Bowel: Small hiatal hernia noted. Stomach otherwise unremarkable. No evidence for bowel obstruction. Appendix is normal. Hazy inflammatory stranding seen about multiple diverticula at the sigmoid colon, consistent with acute sigmoid diverticulitis (series 2, image 2). No evidence for perforation or other complication. No other acute inflammatory changes seen about the bowels. Vascular/Lymphatic: Intra-abdominal aorta of normal caliber. No adenopathy. Reproductive: Uterus and ovaries within normal limits. Other: No free air or fluid. Musculoskeletal: No acute osseus abnormality. No worrisome lytic or blastic osseous lesions. Trace anterolisthesis of L4 on L5 with associated facet arthropathy noted. IMPRESSION: 1. Findings consistent with acute  sigmoid diverticulitis. No complication identified. 2. No other acute intra-abdominal or pelvic process. 3. Probable subtle stones and/or sludge within the gallbladder lumen. No imaging findings to suggest acute cholecystitis. Electronically Signed   By: Jeannine Boga M.D.   On: 02/27/2017 15:30   Ct Maxillofacial Wo Cm  Result Date: 02/27/2017 CLINICAL DATA:  Initial evaluation for acute upper respiratory infection, sinusitis. EXAM: CT MAXILLOFACIAL WITHOUT CONTRAST TECHNIQUE: Multidetector CT imaging of the maxillofacial structures was performed. Multiplanar CT image reconstructions were also generated. COMPARISON:  Prior CT from 03/11/2016. FINDINGS: Osseous: No acute osseous abnormality seen about the visualized osseous structures. Orbits: Globes and orbital soft tissues within normal limits. Patient status post cataract extraction bilaterally. Sinuses: Left frontal sinus and frontoethmoidal recess are opacified. Anterior left ethmoidal air cells completely  opacified as well. Mild scattered mucosal thickening within the right-sided ethmoidal air cells. Sphenoid sinuses are clear. Minimal mucoperiosteal thickening within the maxillary sinuses bilaterally. Paranasal sinuses otherwise clear. No air-fluid levels identified. Mastoid air cells and middle ear cavities are clear. Soft tissues: Unremarkable. Limited intracranial: Unremarkable. IMPRESSION: Left frontal ethmoidal sinusitis.  Otherwise clear sinuses. Electronically Signed   By: Jeannine Boga M.D.   On: 02/27/2017 15:22    Procedures Procedures (including critical care time)  Medications Ordered in ED Medications  sodium chloride 0.9 % bolus 1,000 mL (0 mLs Intravenous Stopped 02/27/17 1613)  diphenhydrAMINE (BENADRYL) injection 12.5 mg (12.5 mg Intravenous Given 02/27/17 1514)  ampicillin-sulbactam (UNASYN) 1.5 g in sodium chloride 0.9 % 50 mL IVPB (0 g Intravenous Stopped 02/27/17 1703)     Initial Impression / Assessment and Plan / ED Course  I have reviewed the triage vital signs and the nursing notes.  Pertinent labs & imaging results that were available during my care of the patient were reviewed by me and considered in my medical decision making (see chart for details).     Patient presents to the ED with complaints of left lower quadrant abdominal pain with ongoing diarrhea without any hematochezia or melena.  Patient also reports continued sinus pressure and pain that is typical of her mast cell activation syndrome.  She does have chronic sinusitis and was treated with Augmentin approximately 1-1/2 weeks ago.  The patient states that this only mildly improved her symptoms.  Patient denies any other neurological symptoms.  Denies any urinary symptoms.  Denies any fevers.  On exam patient does have some left lower quadrant abdominal tenderness to palpation but no signs of peritonitis.  Lungs clear to auscultation bilaterally.  Ears appear normal.  Oropharynx is clear.  She does  have sinus pressure and pain with palpation.  No leukocytosis is noted.  Electro lites are at baseline no signs of dehydration.  UA shows no signs of infection.  Lipase is normal.  Discussed imaging with patient and she would like to have a maxillofacial CT scan to rule out any sinusitis.  CT scan of abdomen and pelvis returned that shows acute sigmoid diverticulitis without any complications.  CT scan of maxillofacial also shows persistent ethmoid sinusitis.  The patient states that she has to be very careful antibiotics that she takes because it can make her mast cell activation syndrome worse.  States that she has taken Cipro for diverticulitis in the past and this threw her into the "worst flare of her life".  States that Augmentin usually does well for her.  Augmentin ordered for diverticulitis.  Patient given dose of IV antibiotics in the ED.  Patient has no leukocytosis  is afebrile and has no complication of the CT scan.  Feel the patient can be treated on the outpatient setting.  Does not appear acutely septic at this time.  The Augmentin will also treat her sinusitis.  Patient is requesting that we give her Cipro and Flagyl at the Augmentin does not work and she will talk to her primary care doctor.  We will give her prescription for Cipro and Flagyl however have told patient to take the Augmentin and she needs to talk to her primary care doctor before taking an additional antibiotic.  Repeat abdominal exam is benign.  Patient tolerating p.o. fluids without any difficulties.  Patient was seen and evaluated by attending Dr. Bobby Rumpf who is agreed with the above plan.  Pt is hemodynamically stable, in NAD, & able to ambulate in the ED. Evaluation does not show pathology that would require ongoing emergent intervention or inpatient treatment. I explained the diagnosis to the patient. Pain has been managed & has no complaints prior to dc. Pt is comfortable with above plan and is stable for discharge  at this time. All questions were answered prior to disposition. Strict return precautions for f/u to the ED were discussed. Encouraged follow up with PCP.    Final Clinical Impressions(s) / ED Diagnoses   Final diagnoses:  Chronic ethmoidal sinusitis  Diverticulitis    New Prescriptions Discharge Medication List as of 02/27/2017  4:33 PM    START taking these medications   Details  amoxicillin-clavulanate (AUGMENTIN) 875-125 MG tablet Take 1 tablet by mouth 3 (three) times daily., Starting Fri 02/27/2017, Print    ciprofloxacin (CIPRO) 500 MG tablet Take 1 tablet (500 mg total) by mouth 2 (two) times daily., Starting Fri 02/27/2017, Print    HYDROcodone-acetaminophen (NORCO) 5-325 MG tablet Take 1 tablet by mouth every 6 (six) hours as needed for moderate pain., Starting Fri 02/27/2017, Print    metroNIDAZOLE (FLAGYL) 500 MG tablet Take 1 tablet (500 mg total) by mouth 3 (three) times daily. DO NOT CONSUME ALCOHOL WHILE TAKING THIS MEDICATION., Starting Fri 02/27/2017, Print         Doristine Devoid, PA-C 03/01/17 1708    Fredia Sorrow, MD 03/03/17 [phone]

## 2017-03-03 ENCOUNTER — Ambulatory Visit (INDEPENDENT_AMBULATORY_CARE_PROVIDER_SITE_OTHER): Payer: Medicare HMO | Admitting: Family Medicine

## 2017-03-03 ENCOUNTER — Encounter: Payer: Self-pay | Admitting: Family Medicine

## 2017-03-03 VITALS — BP 124/64 | HR 88 | Temp 97.6°F | Ht 67.0 in | Wt 157.8 lb

## 2017-03-03 DIAGNOSIS — J018 Other acute sinusitis: Secondary | ICD-10-CM

## 2017-03-03 DIAGNOSIS — K5732 Diverticulitis of large intestine without perforation or abscess without bleeding: Secondary | ICD-10-CM

## 2017-03-03 NOTE — Progress Notes (Signed)
   Subjective:    Patient ID: Katie [name], female    DOB: 1956-02-25, 61 y.o.   MRN: [id number]  HPI Here to follow up several recent medical visits. On 02-03-17 she saw Dr. Elease Hashimoto for an apparent sinus  infection, and he gave her 7 days of Augmentin 875 bid. Then she developed a lot of fatigue and she went to the ER on 02-22-17 asking to have her electrolytes checked since low sodium levels can set off mast cell attacks in her body. Her labs that day were all normal, so she was given a liter of IVF and sent home. Then she developed LLQ abdominal pains and diarrhea and she went back to the ER on 02-27-17. Her labs were still unremarkable but a CT scan confirmed sigmoid diverticulitis. She was given more Augmentin 875 but was told to take this TID. She was also given prescriptions for Cipro and for Flagyl to take if she felt she needed them, but she has not filled these yet. Today she feels much better in general. All her sinus symptoms have resolved. The abdominal pain has almost disappeared and the diarrhea has lessened. No fevers. She has taken only a liquid diet for the past 6 days, and she asks to have her sodium level checked again.    Review of Systems  Constitutional: Negative.   Respiratory: Negative.   Cardiovascular: Negative.   Gastrointestinal: Positive for abdominal pain and diarrhea. Negative for abdominal distention, anal bleeding, blood in stool, constipation, nausea, rectal pain and vomiting.  Genitourinary: Negative.   Neurological: Negative.        Objective:   Physical Exam  Constitutional: She appears well-developed and well-nourished.  Cardiovascular: Normal rate, regular rhythm, normal heart sounds and intact distal pulses.   Pulmonary/Chest: Effort normal and breath sounds normal. No respiratory distress. She has no wheezes. She has no rales.  Abdominal: Soft. Bowel sounds are normal. She exhibits no distension and no mass. There is no tenderness. There is no rebound  and no guarding.          Assessment & Plan:  Her sinusitis has resolved. Her diverticulitis has improved but has not quite resolved. I advised her to finish out the Augmentin but to decrease the dosing to BID. We will send her for a BMET today. She can begin to slowly advance her diet tonight with simple starches like bread or rice.  Alysia Penna, MD

## 2017-03-04 ENCOUNTER — Telehealth: Payer: Self-pay | Admitting: Family Medicine

## 2017-03-04 LAB — BASIC METABOLIC PANEL
BUN: 11 mg/dL (ref 6–23)
CHLORIDE: 97 meq/L (ref 96–112)
CO2: 31 mEq/L (ref 19–32)
CREATININE: 0.64 mg/dL (ref 0.40–1.20)
Calcium: 9.9 mg/dL (ref 8.4–10.5)
GFR: 99.86 mL/min (ref >60.00)
Glucose, Bld: 91 mg/dL (ref 70–99)
Potassium: 4.6 mEq/L (ref 3.5–5.1)
Sodium: 134 mEq/L — ABNORMAL LOW (ref 135–145)

## 2017-03-04 NOTE — Telephone Encounter (Signed)
Pt would like a call back about lab results done yesterday. Pt is a Dr Elease Hashimoto pt, but saw Dr Sarajane Jews

## 2017-03-06 ENCOUNTER — Emergency Department (HOSPITAL_BASED_OUTPATIENT_CLINIC_OR_DEPARTMENT_OTHER)
Admission: EM | Admit: 2017-03-06 | Discharge: 2017-03-06 | Disposition: A | Discharge status: Home / Self Care | Payer: Medicare HMO | Attending: Emergency Medicine | Admitting: Emergency Medicine

## 2017-03-06 ENCOUNTER — Encounter (HOSPITAL_BASED_OUTPATIENT_CLINIC_OR_DEPARTMENT_OTHER): Payer: Self-pay | Admitting: *Deleted

## 2017-03-06 DIAGNOSIS — I10 Essential (primary) hypertension: Secondary | ICD-10-CM | POA: Insufficient documentation

## 2017-03-06 DIAGNOSIS — R5383 Other fatigue: Secondary | ICD-10-CM | POA: Diagnosis not present

## 2017-03-06 DIAGNOSIS — Z79899 Other long term (current) drug therapy: Secondary | ICD-10-CM | POA: Insufficient documentation

## 2017-03-06 LAB — URINALYSIS, MICROSCOPIC (REFLEX): WBC UA: NONE SEEN WBC/hpf (ref 0–5)

## 2017-03-06 LAB — BASIC METABOLIC PANEL
ANION GAP: 7 (ref 5–15)
BUN: 7 mg/dL (ref 6–20)
CALCIUM: 9.1 mg/dL (ref 8.9–10.3)
CO2: 26 mmol/L (ref 22–32)
CREATININE: 0.7 mg/dL (ref 0.44–1.00)
Chloride: 102 mmol/L (ref 101–111)
GFR calc non Af Amer: >60 mL/min (ref >60)
Glucose, Bld: 64 mg/dL — ABNORMAL LOW (ref 65–99)
Potassium: 3.7 mmol/L (ref 3.5–5.1)
SODIUM: 135 mmol/L (ref 135–145)

## 2017-03-06 LAB — URINALYSIS, ROUTINE W REFLEX MICROSCOPIC
Bilirubin Urine: NEGATIVE
Glucose, UA: NEGATIVE mg/dL
KETONES UR: NEGATIVE mg/dL
LEUKOCYTES UA: NEGATIVE
Nitrite: NEGATIVE
PROTEIN: NEGATIVE mg/dL
Specific Gravity, Urine: <1.005 — ABNORMAL LOW (ref 1.005–1.030)
pH: 6.5 (ref 5.0–8.0)

## 2017-03-06 LAB — CBC
HEMATOCRIT: 38.4 % (ref 36.0–46.0)
HEMOGLOBIN: 13.1 g/dL (ref 12.0–15.0)
MCH: 32.4 pg (ref 26.0–34.0)
MCHC: 34.1 g/dL (ref 30.0–36.0)
MCV: 95 fL (ref 78.0–100.0)
Platelets: 297 10*3/uL (ref 150–400)
RBC: 4.04 MIL/uL (ref 3.87–5.11)
RDW: 11.6 % (ref 11.5–15.5)
WBC: 3.3 10*3/uL — AB (ref 4.0–10.5)

## 2017-03-06 MED ORDER — SODIUM CHLORIDE 0.9 % IV BOLUS (SEPSIS)
1000.0000 mL | Freq: Once | INTRAVENOUS | Status: AC
  Administered 2017-03-06: 1000 mL via INTRAVENOUS

## 2017-03-06 NOTE — Discharge Instructions (Signed)
Please read and follow all provided instructions.  Your diagnoses today include:  1. Fatigue, unspecified type     Tests performed today include: Vital signs. See below for your results today.   Medications prescribed:  Take as prescribed   Home care instructions:  Follow any educational materials contained in this packet.  Follow-up instructions: Please follow-up with your primary care provider for further evaluation of symptoms and treatment   Return instructions:  Please return to the Emergency Department if you do not get better, if you get worse, or new symptoms OR  - Fever (temperature greater than 101.61F)  - Bleeding that does not stop with holding pressure to the area    -Severe pain (please note that you may be more sore the day after your accident)  - Chest Pain  - Difficulty breathing  - Severe nausea or vomiting  - Inability to tolerate food and liquids  - Passing out  - Skin becoming red around your wounds  - Change in mental status (confusion or lethargy)  - New numbness or weakness    Please return if you have any other emergent concerns.  Additional Information:  Your vital signs today were: BP 131/76    Pulse 98    Temp 98.2 F (36.8 C) (Oral)    Resp 20    Ht 5\' 7"  (1.702 m)    Wt 71.2 kg (157 lb)    LMP 03/15/2013    SpO2 100%    BMI 24.59 kg/m  If your blood pressure (BP) was elevated above 135/85 this visit, please have this repeated by your doctor within one month. ---------------

## 2017-03-06 NOTE — ED Triage Notes (Signed)
Fatigue and weakness. States lab work in the beginning of the week showed a slightly low sodium and she feels that is the cause for the symptoms.

## 2017-03-06 NOTE — ED Provider Notes (Signed)
[facility] EMERGENCY DEPARTMENT Provider Note   CSN: [id number] Arrival date & time: 03/06/17  1106     History   Chief Complaint Chief Complaint  Patient presents with  . Fatigue    HPI Katie [name] is a 61 y.o. female.  HPI  61 y.o. female with a hx of HTN, Mast Cell Activation Syndrome, presents to the Emergency Department today due to fatigue and weakness. Notes receiving lab work at  The beginning of this week from PCP with slightly lowered sodium levels and thinks this is likely the cause of her symptoms. Chart review shows PCP visit on 03-03-17. [facility] ED visit on 02-22-17 with diagnosis of diverticulitis. Pt was placed on Augmentin and Rx of Cipro/Flagyl to take if she needed them. Noted improvement of symptoms during PCP visit. Today, pt states that she feels as if her sodium is low. Notes decrease in regular diet as she is doing a liquid diet since being diagnosed with diverticulitis. States she has not been able to salt her foods. Denies pain. No headaches. No N/V/D. No CP/SOB/ABD pain. No fevers. No cough/congestion. No symptoms of abdominal pain are resolving. Finishes ABX on Sunday. No other symptoms noted       Past Medical History:  Diagnosis Date  . Anxiety   . Fibromyalgia   . Hypertension   . Hyponatremia   . Lyme disease   . Lyme disease   . Mast cell activation syndrome ([place])   . POTS (postural orthostatic tachycardia syndrome)     Patient Active Problem List   Diagnosis Date Noted  . Diarrhea 11/14/2015  . Acute gastroenteritis 11/14/2015  . Dehydration   . Enteritis 11/13/2015  . Hyponatremia 11/13/2015  . Elevated blood pressure reading 07/14/2013  . Lyme disease 10/25/2012  . Chronic fatigue syndrome 10/25/2012  . Anxiety and depression 10/25/2012  . Fibromyalgia 10/25/2012  . Hormone replacement therapy - followed by Dr. Lonia Skinner in gyn and by [facility] 10/25/2012    Past Surgical History:  Procedure  Laterality Date  . Cataract Removal With Lens Implant Placement Left 03-06-2015    OB History    No data available       Home Medications    Prior to Admission medications   Medication Sig Start Date End Date Taking? Authorizing Provider  ALPRAZolam Duanne Moron) 0.5 MG tablet Take 1 tablet (0.5 mg total) by mouth 2 (two) times daily as needed for anxiety. 02/03/17   Burchette, Alinda Sierras, MD  amoxicillin-clavulanate (AUGMENTIN) 875-125 MG tablet Take 1 tablet by mouth 3 (three) times daily. 02/27/17   Doristine Devoid, PA-C  cetirizine (ZYRTEC ALLERGY) 10 MG tablet Take 10 mg by mouth daily.    [provider]  Cholecalciferol (VITAMIN D-3) 5000 UNITS TABS Take 5,000 tablets by mouth daily.    [provider]  Coenzyme Q10 (CO Q10 PO) Take 400 mg by mouth daily.    [provider]  CYANOCOBALAMIN-METHYLCOBALAMIN SL Inject 1 mL as directed every 7 (seven) days.     [provider]  folic acid (FOLVITE) 1 MG tablet Take 1 mg by mouth daily.    [provider]  Ketotifen Fumarate POWD Take by mouth.    [provider]  metroNIDAZOLE (FLAGYL) 500 MG tablet Take 1 tablet (500 mg total) by mouth 3 (three) times daily. DO NOT CONSUME ALCOHOL WHILE TAKING THIS MEDICATION. Patient not taking: Reported on 03/03/2017 02/27/17   Ocie Cornfield T, PA-C  montelukast (SINGULAIR)  10 MG tablet Take 10 mg by mouth at bedtime.    [provider]  Nutritional Supplements (RESTORE-X PO) Take by mouth.    [provider]  Omega-3 Fatty Acids (FISH OIL PO) Take 2 capsules by mouth 2 (two) times daily. BARLEANS FISH OIL    [provider]  OVER THE COUNTER MEDICATION Take 2 capsules by mouth daily. S-Acetyl Glutathione (SYNERGY)    [provider]  OVER THE COUNTER MEDICATION Take 325 mg by mouth 2 (two) times daily. NATURAL CALM (magnesium carbonate and citric acid)-IONIC MAGNESIUM CITRATE    [provider]  OVER  THE COUNTER MEDICATION Equilib - 2 tabs twice daily    [provider]  Probiotic Product (PROBIOTIC ADVANCED PO) Take 1 capsule by mouth 2 (two) times daily. 100 BILLION    [provider]  QUERCETIN PO Take by mouth.    [provider]  THEANINE PO Take by mouth.    [provider]    Family History Family History  Problem Relation Age of Onset  . Heart disease Mother        CHF  . Hypertension Mother   . Cancer Father 75       lung cancer  . Hypertension Paternal Grandmother   . Stroke Paternal Grandmother   . Hypertension Paternal Grandfather     Social History Social History  Substance Use Topics  . Smoking status: Never Smoker  . Smokeless tobacco: Never Used  . Alcohol use Yes     Comment: Ocaasional glass of wine     Allergies   Azithromycin; Ciprofloxacin; Prednisone; Sulfa antibiotics; Sulfacetamide sodium; and Wheat bran   Review of Systems Review of Systems ROS reviewed and all are negative for acute change except as noted in the HPI.  Physical Exam Updated Vital Signs BP 131/76   Pulse 98   Temp 98.2 F (36.8 C) (Oral)   Resp 20   Ht 5\' 7"  (1.702 m)   Wt 71.2 kg (157 lb)   LMP 03/15/2013   SpO2 100%   BMI 24.59 kg/m   Physical Exam  Constitutional: She is oriented to person, place, and time. Vital signs are normal. She appears well-developed and well-nourished.  HENT:  Head: Normocephalic and atraumatic.  Right Ear: Hearing normal.  Left Ear: Hearing normal.  Eyes: Pupils are equal, round, and reactive to light. Conjunctivae and EOM are normal.  Neck: Normal range of motion. Neck supple.  Cardiovascular: Normal rate, regular rhythm, normal heart sounds and intact distal pulses.   Pulmonary/Chest: Effort normal and breath sounds normal.  Abdominal: Soft.  Musculoskeletal: Normal range of motion.  Neurological: She is alert and oriented to person, place, and time.  Skin: Skin is warm and dry.  Psychiatric:  She has a normal mood and affect. Her speech is normal and behavior is normal. Thought content normal.  Nursing note and vitals reviewed.    ED Treatments / Results  Labs (all labs ordered are listed, but only abnormal results are displayed) Labs Reviewed  URINALYSIS, ROUTINE W REFLEX MICROSCOPIC - Abnormal; Notable for the following:       Result Value   Specific Gravity, Urine <1.005 (*)    Hgb urine dipstick TRACE (*)    All other components within normal limits  CBC - Abnormal; Notable for the following:    WBC 3.3 (*)    All other components within normal limits  BASIC METABOLIC PANEL - Abnormal; Notable for the following:  Glucose, Bld 64 (*)    All other components within normal limits  URINALYSIS, MICROSCOPIC (REFLEX) - Abnormal; Notable for the following:    Bacteria, UA FEW (*)    Squamous Epithelial / LPF 0-5 (*)    All other components within normal limits    EKG  EKG Interpretation None       Radiology No results found.  Procedures Procedures (including critical care time)  Medications Ordered in ED Medications  sodium chloride 0.9 % bolus 1,000 mL (1,000 mLs Intravenous New Bag/Given 03/06/17 1149)     Initial Impression / Assessment and Plan / ED Course  I have reviewed the triage vital signs and the nursing notes.  Pertinent labs & imaging results that were available during my care of the patient were reviewed by me and considered in my medical decision making (see chart for details).  Final Clinical Impressions(s) / ED Diagnoses  {I have reviewed and evaluated the relevant laboratory values.   {I have reviewed the relevant previous healthcare records.  {I obtained HPI from historian.   ED Course:  Assessment: Pt is a 61 y.o. female with a hx of HTN, Mast Cell Activation Syndrome, presents to the Emergency Department today due to fatigue and weakness. Notes receiving lab work at  The beginning of this week from PCP with slightly lowered sodium  levels and thinks this is likely the cause of her symptoms. Chart review shows PCP visit on 03-03-17. [facility] ED visit on 02-22-17 with diagnosis of diverticulitis. Pt was placed on Augmentin and Rx of Cipro/Flagyl to take if she needed them. Noted improvement of symptoms during PCP visit. Today, pt states that she feels as if her sodium is low. Notes decrease in regular diet as she is doing a liquid diet since being diagnosed with diverticulitis. States she has not been able to salt her foods. Denies pain. No headaches. No N/V/D. No CP/SOB/ABD pain. No fevers. No cough/congestion. No symptoms of abdominal pain are resolving. Finishes ABX on Sunday.. On exam, pt in NAD. Nontoxic/nonseptic appearing. VSS. Afebrile. Lungs CTA. Heart RRR. Abdomen nontender soft. CBC unremarkable. BMP unremarkable. Given NS bolus ED with improvement of symptoms. Plan is to DC home with follow up to PCP. At time of discharge, Patient is in no acute distress. Vital Signs are stable. Patient is able to ambulate. Patient able to tolerate PO.   Disposition/Plan:  DC Home Additional Verbal discharge instructions given and discussed with patient.  Pt Instructed to f/u with PCP in the next week for evaluation and treatment of symptoms. Return precautions given Pt acknowledges and agrees with plan  Supervising Physician Davonna Belling, MD  Final diagnoses:  Fatigue, unspecified type    New Prescriptions New Prescriptions   No medications on file     Shary Decamp, Hershal Coria 03/06/17 [place], MD 03/06/17 [phone]

## 2017-03-09 NOTE — Telephone Encounter (Signed)
We spoke to her on 03-05-17 about this

## 2017-03-10 ENCOUNTER — Ambulatory Visit: Payer: Medicare HMO | Admitting: Family Medicine

## 2017-03-10 ENCOUNTER — Emergency Department (HOSPITAL_BASED_OUTPATIENT_CLINIC_OR_DEPARTMENT_OTHER)
Admission: EM | Admit: 2017-03-10 | Discharge: 2017-03-10 | Disposition: A | Discharge status: Home / Self Care | Payer: Medicare HMO | Attending: Emergency Medicine | Admitting: Emergency Medicine

## 2017-03-10 ENCOUNTER — Encounter (HOSPITAL_BASED_OUTPATIENT_CLINIC_OR_DEPARTMENT_OTHER): Payer: Self-pay | Admitting: *Deleted

## 2017-03-10 ENCOUNTER — Other Ambulatory Visit: Payer: Self-pay

## 2017-03-10 DIAGNOSIS — Z5321 Procedure and treatment not carried out due to patient leaving prior to being seen by health care provider: Secondary | ICD-10-CM | POA: Insufficient documentation

## 2017-03-10 DIAGNOSIS — R109 Unspecified abdominal pain: Secondary | ICD-10-CM | POA: Insufficient documentation

## 2017-03-10 DIAGNOSIS — R531 Weakness: Secondary | ICD-10-CM | POA: Diagnosis not present

## 2017-03-10 NOTE — ED Notes (Signed)
Taken to the w/a to wait for treatment room. No distress. Placed in wheel chair. Request to sit near a place to charge her phone.

## 2017-03-10 NOTE — ED Triage Notes (Signed)
States she is having a mass cell attack. She is anxious and crying at triage. States she has not been eating and should have come this am when her weakness started.

## 2017-03-11 ENCOUNTER — Ambulatory Visit: Payer: Medicare HMO | Admitting: Family Medicine

## 2017-03-11 DIAGNOSIS — R5383 Other fatigue: Secondary | ICD-10-CM | POA: Diagnosis not present

## 2017-03-11 DIAGNOSIS — R197 Diarrhea, unspecified: Secondary | ICD-10-CM | POA: Diagnosis not present

## 2017-03-11 DIAGNOSIS — K5792 Diverticulitis of intestine, part unspecified, without perforation or abscess without bleeding: Secondary | ICD-10-CM | POA: Diagnosis not present

## 2017-03-12 ENCOUNTER — Telehealth: Payer: Self-pay | Admitting: Family Medicine

## 2017-03-12 DIAGNOSIS — K5792 Diverticulitis of intestine, part unspecified, without perforation or abscess without bleeding: Secondary | ICD-10-CM | POA: Diagnosis not present

## 2017-03-12 DIAGNOSIS — R197 Diarrhea, unspecified: Secondary | ICD-10-CM | POA: Diagnosis not present

## 2017-03-12 DIAGNOSIS — R5383 Other fatigue: Secondary | ICD-10-CM | POA: Diagnosis not present

## 2017-03-12 NOTE — Telephone Encounter (Signed)
OK to fill

## 2017-03-12 NOTE — Telephone Encounter (Signed)
Please let patient know her doctor will be back in the office tomorrow morning.  Please let her know we will forward the message to him.  Please forward to PCP.  Thanks.

## 2017-03-12 NOTE — Telephone Encounter (Signed)
I called the pt and informed her of the message below

## 2017-03-12 NOTE — Telephone Encounter (Signed)
° °  Pt call to say she went to ER at [facility] for what she thought was a Diverticulitis attack. She said she has been sick for about 3 weeks. The doctor gave her a script for 10 pills of the below med  And he told her not to fill it unless you are ok with it. Would like a call back   [phone]  ALPRAZolam (XANAX) 0.5 MG tablet

## 2017-03-13 ENCOUNTER — Ambulatory Visit: Payer: Self-pay | Admitting: Obstetrics & Gynecology

## 2017-03-25 ENCOUNTER — Other Ambulatory Visit: Payer: Self-pay

## 2017-03-25 ENCOUNTER — Encounter (HOSPITAL_BASED_OUTPATIENT_CLINIC_OR_DEPARTMENT_OTHER): Payer: Self-pay

## 2017-03-25 ENCOUNTER — Emergency Department (HOSPITAL_BASED_OUTPATIENT_CLINIC_OR_DEPARTMENT_OTHER)
Admission: EM | Admit: 2017-03-25 | Discharge: 2017-03-25 | Disposition: A | Discharge status: Home / Self Care | Payer: Medicare HMO | Attending: Emergency Medicine | Admitting: Emergency Medicine

## 2017-03-25 DIAGNOSIS — R5383 Other fatigue: Secondary | ICD-10-CM | POA: Insufficient documentation

## 2017-03-25 DIAGNOSIS — I10 Essential (primary) hypertension: Secondary | ICD-10-CM | POA: Diagnosis not present

## 2017-03-25 DIAGNOSIS — Z79899 Other long term (current) drug therapy: Secondary | ICD-10-CM | POA: Insufficient documentation

## 2017-03-25 LAB — COMPREHENSIVE METABOLIC PANEL
ALBUMIN: 4.4 g/dL (ref 3.5–5.0)
ALK PHOS: 73 U/L (ref 38–126)
ALT: 18 U/L (ref 14–54)
AST: 21 U/L (ref 15–41)
Anion gap: 7 (ref 5–15)
BILIRUBIN TOTAL: 0.8 mg/dL (ref 0.3–1.2)
BUN: 9 mg/dL (ref 6–20)
CALCIUM: 9.6 mg/dL (ref 8.9–10.3)
CO2: 28 mmol/L (ref 22–32)
Chloride: 103 mmol/L (ref 101–111)
Creatinine, Ser: 0.69 mg/dL (ref 0.44–1.00)
GFR calc non Af Amer: >60 mL/min (ref >60)
GLUCOSE: 100 mg/dL — AB (ref 65–99)
Potassium: 3.6 mmol/L (ref 3.5–5.1)
SODIUM: 138 mmol/L (ref 135–145)
TOTAL PROTEIN: 8.1 g/dL (ref 6.5–8.1)

## 2017-03-25 LAB — TROPONIN I: Troponin I: <0.03 ng/mL (ref <0.03)

## 2017-03-25 LAB — CBC
HEMATOCRIT: 40.6 % (ref 36.0–46.0)
Hemoglobin: 13.3 g/dL (ref 12.0–15.0)
MCH: 31.5 pg (ref 26.0–34.0)
MCHC: 32.8 g/dL (ref 30.0–36.0)
MCV: 96.2 fL (ref 78.0–100.0)
PLATELETS: 239 10*3/uL (ref 150–400)
RBC: 4.22 MIL/uL (ref 3.87–5.11)
RDW: 12 % (ref 11.5–15.5)
WBC: 6 10*3/uL (ref 4.0–10.5)

## 2017-03-25 MED ORDER — SODIUM CHLORIDE 0.9 % IV BOLUS (SEPSIS)
2000.0000 mL | Freq: Once | INTRAVENOUS | Status: AC
  Administered 2017-03-25: 2000 mL via INTRAVENOUS

## 2017-03-25 NOTE — ED Notes (Signed)
Pt stated that she need to leave now. Is not willing to wait any longer for edp to come out of a room.  Sts she has family visiting from out of state and she must go now.  Sts she feels much better and is not willing to wait any longer.

## 2017-03-25 NOTE — ED Provider Notes (Signed)
[facility] EMERGENCY DEPARTMENT Provider Note   CSN: [id number] Arrival date & time: 03/25/17  1720     History   Chief Complaint Chief Complaint  Patient presents with  . Fatigue    HPI Katie [name] is a 61 y.o. female.  Patient feeling exceedingly tired and fatigued onset 2 days ago however feels worse today.  She denies pain anywhere.  Denies fever.  She does admit to slight sinus congestion for 2 days.  She had 2 episodes of diarrhea today.  Tenderness is somewhat worse with standing.  She does feel thirsty.  Denies chest pain denies abdominal pain no other associated symptoms.  Lightheadedness worse with standing and improved with lying down.  HPI  Past Medical History:  Diagnosis Date  . Anxiety   . Fibromyalgia   . Hypertension   . Hyponatremia   . Lyme disease   . Lyme disease   . Mast cell activation syndrome ([place])   . POTS (postural orthostatic tachycardia syndrome)     Patient Active Problem List   Diagnosis Date Noted  . Diarrhea 11/14/2015  . Acute gastroenteritis 11/14/2015  . Dehydration   . Enteritis 11/13/2015  . Hyponatremia 11/13/2015  . Elevated blood pressure reading 07/14/2013  . Lyme disease 10/25/2012  . Chronic fatigue syndrome 10/25/2012  . Anxiety and depression 10/25/2012  . Fibromyalgia 10/25/2012  . Hormone replacement therapy - followed by Dr. Lonia Skinner in gyn and by [facility] 10/25/2012    Past Surgical History:  Procedure Laterality Date  . Cataract Removal With Lens Implant Placement Left 03-06-2015    OB History    No data available       Home Medications    Prior to Admission medications   Medication Sig Start Date End Date Taking? Authorizing Provider  ALPRAZolam Duanne Moron) 0.5 MG tablet Take 1 tablet (0.5 mg total) by mouth 2 (two) times daily as needed for anxiety. 02/03/17   Burchette, Alinda Sierras, MD  amoxicillin-clavulanate (AUGMENTIN) 875-125 MG tablet Take 1 tablet by mouth 3 (three)  times daily. 02/27/17   Doristine Devoid, PA-C  cetirizine (ZYRTEC ALLERGY) 10 MG tablet Take 10 mg by mouth daily.    [provider]  Cholecalciferol (VITAMIN D-3) 5000 UNITS TABS Take 5,000 tablets by mouth daily.    [provider]  Coenzyme Q10 (CO Q10 PO) Take 400 mg by mouth daily.    [provider]  CYANOCOBALAMIN-METHYLCOBALAMIN SL Inject 1 mL as directed every 7 (seven) days.     [provider]  folic acid (FOLVITE) 1 MG tablet Take 1 mg by mouth daily.    [provider]  Ketotifen Fumarate POWD Take by mouth.    [provider]  metroNIDAZOLE (FLAGYL) 500 MG tablet Take 1 tablet (500 mg total) by mouth 3 (three) times daily. DO NOT CONSUME ALCOHOL WHILE TAKING THIS MEDICATION. Patient not taking: Reported on 03/03/2017 02/27/17   Ocie Cornfield T, PA-C  montelukast (SINGULAIR) 10 MG tablet Take 10 mg by mouth at bedtime.    [provider]  Nutritional Supplements (RESTORE-X PO) Take by mouth.    [provider]  Omega-3 Fatty Acids (FISH OIL PO) Take 2 capsules by mouth 2 (two) times daily. BARLEANS FISH OIL    [provider]  OVER THE COUNTER MEDICATION Take 2 capsules by mouth daily. S-Acetyl Glutathione (SYNERGY)    [provider]  OVER THE COUNTER MEDICATION Take 325 mg by mouth 2 (two)  times daily. NATURAL CALM (magnesium carbonate and citric acid)-IONIC MAGNESIUM CITRATE    [provider]  OVER THE COUNTER MEDICATION Equilib - 2 tabs twice daily    [provider]  Probiotic Product (PROBIOTIC ADVANCED PO) Take 1 capsule by mouth 2 (two) times daily. 100 BILLION    [provider]  QUERCETIN PO Take by mouth.    [provider]  THEANINE PO Take by mouth.    [provider]    Family History Family History  Problem Relation Age of Onset  . Heart disease Mother        CHF  . Hypertension Mother   . Cancer Father 9       lung  cancer  . Hypertension Paternal Grandmother   . Stroke Paternal Grandmother   . Hypertension Paternal Grandfather     Social History Social History   Tobacco Use  . Smoking status: Never Smoker  . Smokeless tobacco: Never Used  Substance Use Topics  . Alcohol use: Yes    Comment: occ  . Drug use: No     Allergies   Azithromycin; Ciprofloxacin; Prednisone; Sulfa antibiotics; Sulfacetamide sodium; and Wheat bran   Review of Systems Review of Systems  Constitutional: Positive for fatigue.  HENT: Positive for sinus pressure.        Sinus pressure for 2 days  Respiratory: Negative.   Cardiovascular: Negative.   Gastrointestinal: Negative.   Musculoskeletal: Negative.   Skin: Negative.   Neurological: Negative.   Psychiatric/Behavioral: Negative.   All other systems reviewed and are negative.    Physical Exam Updated Vital Signs BP (!) 172/89 (BP Location: Left Arm)   Pulse 77   Temp 97.7 F (36.5 C) (Oral)   Resp 16   Ht 5' 7.2" (1.707 m)   Wt 68.9 kg (152 lb)   LMP 03/15/2013   SpO2 100%   BMI 23.67 kg/m   Physical Exam  Constitutional: She appears well-developed and well-nourished.  HENT:  Head: Normocephalic and atraumatic.  Eyes: Conjunctivae are normal. Pupils are equal, round, and reactive to light.  Neck: Neck supple. No tracheal deviation present. No thyromegaly present.  Cardiovascular: Normal rate and regular rhythm.  No murmur heard. Pulmonary/Chest: Effort normal and breath sounds normal.  Abdominal: Soft. Bowel sounds are normal. She exhibits no distension. There is no tenderness.  Musculoskeletal: Normal range of motion. She exhibits no edema or tenderness.  Neurological: She is alert. Coordination normal.  Skin: Skin is warm and dry. No rash noted.  Psychiatric: She has a normal mood and affect.  Nursing note and vitals reviewed.    ED Treatments / Results  Labs (all labs ordered are listed, but only abnormal results are  displayed) Labs Reviewed  CBC  TROPONIN I  COMPREHENSIVE METABOLIC PANEL    EKG  EKG Interpretation  Date/Time:  Wednesday March 25 2017 20:29:00 EST Ventricular Rate:  73 PR Interval:    QRS Duration: 111 QT Interval:  469 QTC Calculation: 517 R Axis:   45 Text Interpretation:  Sinus rhythm Prolonged QT interval No significant change since last tracing Confirmed by Orlie Dakin [phone]) on 03/25/2017 8:53:16 PM      Results for orders placed or performed during the hospital encounter of 03/25/17  Troponin I  Result Value Ref Range   Troponin I <0.03 <0.03 ng/mL  Comprehensive metabolic panel  Result Value Ref Range   Sodium 138 135 - 145 mmol/L   Potassium 3.6 3.5 - 5.1 mmol/L  Chloride 103 101 - 111 mmol/L   CO2 28 22 - 32 mmol/L   Glucose, Bld 100 (H) 65 - 99 mg/dL   BUN 9 6 - 20 mg/dL   Creatinine, Ser 0.69 0.44 - 1.00 mg/dL   Calcium 9.6 8.9 - 10.3 mg/dL   Total Protein 8.1 6.5 - 8.1 g/dL   Albumin 4.4 3.5 - 5.0 g/dL   AST 21 15 - 41 U/L   ALT 18 14 - 54 U/L   Alkaline Phosphatase 73 38 - 126 U/L   Total Bilirubin 0.8 0.3 - 1.2 mg/dL   GFR calc non Af Amer >60 >60 mL/min   GFR calc Af Amer >60 >60 mL/min   Anion gap 7 5 - 15  CBC  Result Value Ref Range   WBC 6.0 4.0 - 10.5 K/uL   RBC 4.22 3.87 - 5.11 MIL/uL   Hemoglobin 13.3 12.0 - 15.0 g/dL   HCT 40.6 36.0 - 46.0 %   MCV 96.2 78.0 - 100.0 fL   MCH 31.5 26.0 - 34.0 pg   MCHC 32.8 30.0 - 36.0 g/dL   RDW 12.0 11.5 - 15.5 %   Platelets 239 150 - 400 K/uL   Ct Abdomen Pelvis Wo Contrast  Result Date: 02/27/2017 CLINICAL DATA:  Initial evaluation for acute abdominal pain, suspect gastroenteritis or colitis. EXAM: CT ABDOMEN AND PELVIS WITHOUT CONTRAST TECHNIQUE: Multidetector CT imaging of the abdomen and pelvis was performed following the standard protocol without IV contrast. COMPARISON:  Prior ultrasound from 09/11/2014. FINDINGS: Lower chest: Hazy subsegmental atelectatic changes present within  the visualized lung bases. Visualized lungs otherwise clear. Hepatobiliary: Liver demonstrates a normal unenhanced appearance. Subtle hyperdensity within the gallbladder suggestive of sludge and/ or stones. No acute inflammatory changes about the gallbladder. No biliary dilatation. Pancreas: Pancreas within normal limits. Spleen: Spleen within normal limits. Adrenals/Urinary Tract: Adrenal glands are normal. Kidneys equal in size without evidence for nephrolithiasis or hydronephrosis. No radiopaque calculi seen along the course of either renal collecting system. No hydroureter. Bladder within normal limits. Stomach/Bowel: Small hiatal hernia noted. Stomach otherwise unremarkable. No evidence for bowel obstruction. Appendix is normal. Hazy inflammatory stranding seen about multiple diverticula at the sigmoid colon, consistent with acute sigmoid diverticulitis (series 2, image 47). No evidence for perforation or other complication. No other acute inflammatory changes seen about the bowels. Vascular/Lymphatic: Intra-abdominal aorta of normal caliber. No adenopathy. Reproductive: Uterus and ovaries within normal limits. Other: No free air or fluid. Musculoskeletal: No acute osseus abnormality. No worrisome lytic or blastic osseous lesions. Trace anterolisthesis of L4 on L5 with associated facet arthropathy noted. IMPRESSION: 1. Findings consistent with acute sigmoid diverticulitis. No complication identified. 2. No other acute intra-abdominal or pelvic process. 3. Probable subtle stones and/or sludge within the gallbladder lumen. No imaging findings to suggest acute cholecystitis. Electronically Signed   By: Jeannine Boga M.D.   On: 02/27/2017 15:30   Ct Maxillofacial Wo Cm  Result Date: 02/27/2017 CLINICAL DATA:  Initial evaluation for acute upper respiratory infection, sinusitis. EXAM: CT MAXILLOFACIAL WITHOUT CONTRAST TECHNIQUE: Multidetector CT imaging of the maxillofacial structures was performed.  Multiplanar CT image reconstructions were also generated. COMPARISON:  Prior CT from 03/11/2016. FINDINGS: Osseous: No acute osseous abnormality seen about the visualized osseous structures. Orbits: Globes and orbital soft tissues within normal limits. Patient status post cataract extraction bilaterally. Sinuses: Left frontal sinus and frontoethmoidal recess are opacified. Anterior left ethmoidal air cells completely opacified as well. Mild scattered mucosal thickening within the right-sided ethmoidal  air cells. Sphenoid sinuses are clear. Minimal mucoperiosteal thickening within the maxillary sinuses bilaterally. Paranasal sinuses otherwise clear. No air-fluid levels identified. Mastoid air cells and middle ear cavities are clear. Soft tissues: Unremarkable. Limited intracranial: Unremarkable. IMPRESSION: Left frontal ethmoidal sinusitis.  Otherwise clear sinuses. Electronically Signed   By: Jeannine Boga M.D.   On: 02/27/2017 15:22   Radiology No results found.  Procedures Procedures (including critical care time)  Medications Ordered in ED Medications  sodium chloride 0.9 % bolus 2,000 mL (2,000 mLs Intravenous New Bag/Given 03/25/17 2009)     Initial Impression / Assessment and Plan / ED Course  I have reviewed the triage vital signs and the nursing notes.  Pertinent labs & imaging results that were available during my care of the patient were reviewed by me and considered in my medical decision making (see chart for details).     IV hydration with normal saline ordered.  Patient left the emergency department without my being notified. I suspect that she was mildly dehydrated Final Clinical Impressions(s) / ED Diagnoses  Diagnosis Fatigue Final diagnoses:  None    ED Discharge Orders    None       Orlie Dakin, MD 03/26/17 0040

## 2017-03-25 NOTE — ED Notes (Signed)
ED Provider at bedside. 

## 2017-03-25 NOTE — ED Triage Notes (Signed)
Pt c/o fatigue x today with hx of same-pt has written note from her MD "specialist" asking that pt receive IVF for sx-pt NAD-quick steady paced gait into triage

## 2017-04-07 DIAGNOSIS — E049 Nontoxic goiter, unspecified: Secondary | ICD-10-CM | POA: Diagnosis not present

## 2017-04-08 ENCOUNTER — Other Ambulatory Visit: Payer: Self-pay | Admitting: Endocrinology

## 2017-04-08 DIAGNOSIS — E049 Nontoxic goiter, unspecified: Secondary | ICD-10-CM

## 2017-04-09 ENCOUNTER — Encounter (INDEPENDENT_AMBULATORY_CARE_PROVIDER_SITE_OTHER): Payer: Medicare HMO | Admitting: Ophthalmology

## 2017-04-09 DIAGNOSIS — H26493 Other secondary cataract, bilateral: Secondary | ICD-10-CM

## 2017-04-09 DIAGNOSIS — H35372 Puckering of macula, left eye: Secondary | ICD-10-CM

## 2017-04-09 DIAGNOSIS — H43813 Vitreous degeneration, bilateral: Secondary | ICD-10-CM | POA: Diagnosis not present

## 2017-04-09 DIAGNOSIS — D3132 Benign neoplasm of left choroid: Secondary | ICD-10-CM | POA: Diagnosis not present

## 2017-04-10 ENCOUNTER — Telehealth: Payer: Self-pay | Admitting: Family Medicine

## 2017-04-10 MED ORDER — ALPRAZOLAM 0.5 MG PO TABS: 0.5000 mg | ORAL_TABLET | Freq: Two times a day (BID) | ORAL | 0 refills | Status: DC | PRN

## 2017-04-10 NOTE — Telephone Encounter (Signed)
Rx called in 

## 2017-04-10 NOTE — Telephone Encounter (Signed)
Copied from [place] [id number]. Topic: Quick Communication - See Telephone Encounter >> Apr 10, 2017 11:34 AM Bea Graff, NT wrote: CRM for notification. See Telephone encounter for: Pt is calling and states that her pharmacy sent a fax over requesting a renewal for this pts rx of xanax. Pt wants to be sure this has been received and if this can be filled before the snow comes. She is going out of town the middle of next. She uses [facility] on [address]- the 24hr pharmacy in [place]   04/10/17.

## 2017-04-10 NOTE — Telephone Encounter (Signed)
Last refill was given at office visit 02/03/17.  Okay to fill?

## 2017-04-10 NOTE — Telephone Encounter (Signed)
Refill once. We've discussed avoiding regular use and would like for her to taper off completely

## 2017-04-16 ENCOUNTER — Encounter: Payer: Self-pay | Admitting: Obstetrics & Gynecology

## 2017-04-16 ENCOUNTER — Ambulatory Visit: Payer: Medicare HMO | Admitting: Obstetrics & Gynecology

## 2017-04-16 VITALS — BP 128/86 | Ht 67.25 in | Wt 156.0 lb

## 2017-04-16 DIAGNOSIS — Z01419 Encounter for gynecological examination (general) (routine) without abnormal findings: Secondary | ICD-10-CM

## 2017-04-16 DIAGNOSIS — Z780 Asymptomatic menopausal state: Secondary | ICD-10-CM

## 2017-04-16 DIAGNOSIS — Z8619 Personal history of other infectious and parasitic diseases: Secondary | ICD-10-CM

## 2017-04-16 DIAGNOSIS — Z13820 Encounter for screening for osteoporosis: Secondary | ICD-10-CM

## 2017-04-16 NOTE — Progress Notes (Signed)
Katie [name] [birth year] [id number]   History:    61 y.o.  G2P2L2 Married  RP:  Established patient presenting for annual gyn exam   HPI:  Menopause, well on no hormone replacement therapy.  No postmenopausal bleeding.  No pelvic pain.  Breasts normal.  Does thermal mammographies.  History of Lyme disease.  Had episodes of tachycardia with high blood pressure which were recently diagnosed as being an immune system dysfunction associated with a massive and rapid mast cell increase.  Treated with Singulair and corticosteroids.  Past medical history,surgical history, family history and social history were all reviewed and documented in the EPIC chart.  Gynecologic History Patient's last menstrual period was 03/15/2013. Contraception: post menopausal status Last Pap: 03/2016. Results were: Negative Next ThermoMammography scheduled 05/2017 Bone density 2014, will schedule here Cologuard spring 2018 negative  Obstetric History OB History  Gravida Para Term Preterm AB Living  2 2       2   SAB TAB Ectopic Multiple Live Births               # Outcome Date GA Lbr Len/2nd Weight Sex Delivery Anes PTL Lv  2 Para           1 Para                ROS: A ROS was performed and pertinent positives and negatives are included in the history.  GENERAL: No fevers or chills. HEENT: No change in vision, no earache, sore throat or sinus congestion. NECK: No pain or stiffness. CARDIOVASCULAR: No chest pain or pressure. No palpitations. PULMONARY: No shortness of breath, cough or wheeze. GASTROINTESTINAL: No abdominal pain, nausea, vomiting or diarrhea, melena or bright red blood per rectum. GENITOURINARY: No urinary frequency, urgency, hesitancy or dysuria. MUSCULOSKELETAL: No joint or muscle pain, no back pain, no recent trauma. DERMATOLOGIC: No rash, no itching, no lesions. ENDOCRINE: No polyuria, polydipsia, no heat or cold intolerance. No recent change in weight. HEMATOLOGICAL: No anemia or easy  bruising or bleeding. NEUROLOGIC: No headache, seizures, numbness, tingling or weakness. PSYCHIATRIC: No depression, no loss of interest in normal activity or change in sleep pattern.     Exam:   BP 128/86   Ht 5' 7.25" (1.708 m)   Wt 156 lb (70.8 kg)   LMP 03/15/2013   BMI 24.25 kg/m   Body mass index is 24.25 kg/m.  General appearance : Well developed well nourished female. No acute distress HEENT: Eyes: no retinal hemorrhage or exudates,  Neck supple, trachea midline, no carotid bruits, no thyroidmegaly Lungs: Clear to auscultation, no rhonchi or wheezes, or rib retractions  Heart: Regular rate and rhythm, no murmurs or gallops Breast:Examined in sitting and supine position were symmetrical in appearance, no palpable masses or tenderness,  no skin retraction, no nipple inversion, no nipple discharge, no skin discoloration, no axillary or supraclavicular lymphadenopathy Abdomen: no palpable masses or tenderness, no rebound or guarding Extremities: no edema or skin discoloration or tenderness  Pelvic: Vulva normal  Bartholin, Urethra, Skene Glands: Within normal limits             Vagina: No gross lesions or discharge  Cervix: No gross lesions or discharge  Uterus  AV, normal size, shape and consistency, non-tender and mobile  Adnexa  Without masses or tenderness  Anus and perineum  normal   Rectovaginal  normal sphincter tone without palpated masses or tenderness   Assessment/Plan:  61 y.o. female for annual exam  1. Well female exam with routine gynecological exam Normal gynecologic exam.  Last Pap test negative November 2017.  Will repeat Pap test next year.  Breast exam normal.  Will do thermal mammography in January 2019.  Declines screening mammography.  Color guard negative in 2018.  Declines colonoscopy at this time.  2. Menopause present No HRT.  No postmenopausal bleeding.  Recommend coconut oil for IC.  3. Screening for osteoporosis Vitamin D supplements,  calcium rich nutrition and weightbearing physical activity recommended.  We will follow-up here for bone density. - DG Bone Density; Future  4. H/o Lyme disease Mast cell reaction followed in [facility]   Princess Bruins MD, 12:40 PM 04/16/2017

## 2017-04-18 ENCOUNTER — Encounter: Payer: Self-pay | Admitting: Obstetrics & Gynecology

## 2017-04-18 NOTE — Patient Instructions (Signed)
1. Well female exam with routine gynecological exam Normal gynecologic exam.  Last Pap test negative November 2017.  Will repeat Pap test next year.  Breast exam normal.  Will do thermal mammography in January 2019.  Declines screening mammography.  Color guard negative in 2018.  Declines colonoscopy at this time.  2. Menopause present No HRT.  No postmenopausal bleeding.  Recommend coconut oil for IC.  3. Screening for osteoporosis Vitamin D supplements, calcium rich nutrition and weightbearing physical activity recommended.  We will follow-up here for bone density. - DG Bone Density; Future  4. H/o Lyme disease Mast cell reaction followed in [facility], it was a pleasure seeing you today!  I will inform you of your bone density results when available.   Health Maintenance for Postmenopausal Women Menopause is a normal process in which your reproductive ability comes to an end. This process happens gradually over a span of months to years, usually between the ages of 50 and 98. Menopause is complete when you have missed 12 consecutive menstrual periods. It is important to talk with your health care provider about some of the most common conditions that affect postmenopausal women, such as heart disease, cancer, and bone loss (osteoporosis). Adopting a healthy lifestyle and getting preventive care can help to promote your health and wellness. Those actions can also lower your chances of developing some of these common conditions. What should I know about menopause? During menopause, you may experience a number of symptoms, such as:  Moderate-to-severe hot flashes.  Night sweats.  Decrease in sex drive.  Mood swings.  Headaches.  Tiredness.  Irritability.  Memory problems.  Insomnia.  Choosing to treat or not to treat menopausal changes is an individual decision that you make with your health care provider. What should I know about hormone replacement therapy and  supplements? Hormone therapy products are effective for treating symptoms that are associated with menopause, such as hot flashes and night sweats. Hormone replacement carries certain risks, especially as you become older. If you are thinking about using estrogen or estrogen with progestin treatments, discuss the benefits and risks with your health care provider. What should I know about heart disease and stroke? Heart disease, heart attack, and stroke become more likely as you age. This may be due, in part, to the hormonal changes that your body experiences during menopause. These can affect how your body processes dietary fats, triglycerides, and cholesterol. Heart attack and stroke are both medical emergencies. There are many things that you can do to help prevent heart disease and stroke:  Have your blood pressure checked at least every 1-2 years. High blood pressure causes heart disease and increases the risk of stroke.  If you are 74-60 years old, ask your health care provider if you should take aspirin to prevent a heart attack or a stroke.  Do not use any tobacco products, including cigarettes, chewing tobacco, or electronic cigarettes. If you need help quitting, ask your health care provider.  It is important to eat a healthy diet and maintain a healthy weight. ? Be sure to include plenty of vegetables, fruits, low-fat dairy products, and lean protein. ? Avoid eating foods that are high in solid fats, added sugars, or salt (sodium).  Get regular exercise. This is one of the most important things that you can do for your health. ? Try to exercise for at least 150 minutes each week. The type of exercise that you do should increase your heart rate  and make you sweat. This is known as moderate-intensity exercise. ? Try to do strengthening exercises at least twice each week. Do these in addition to the moderate-intensity exercise.  Know your numbers.Ask your health care provider to check  your cholesterol and your blood glucose. Continue to have your blood tested as directed by your health care provider.  What should I know about cancer screening? There are several types of cancer. Take the following steps to reduce your risk and to catch any cancer development as early as possible. Breast Cancer  Practice breast self-awareness. ? This means understanding how your breasts normally appear and feel. ? It also means doing regular breast self-exams. Let your health care provider know about any changes, no matter how small.  If you are 74 or older, have a clinician do a breast exam (clinical breast exam or CBE) every year. Depending on your age, family history, and medical history, it may be recommended that you also have a yearly breast X-ray (mammogram).  If you have a family history of breast cancer, talk with your health care provider about genetic screening.  If you are at high risk for breast cancer, talk with your health care provider about having an MRI and a mammogram every year.  Breast cancer (BRCA) gene test is recommended for women who have family members with BRCA-related cancers. Results of the assessment will determine the need for genetic counseling and BRCA1 and for BRCA2 testing. BRCA-related cancers include these types: ? Breast. This occurs in males or females. ? Ovarian. ? Tubal. This may also be called fallopian tube cancer. ? Cancer of the abdominal or pelvic lining (peritoneal cancer). ? Prostate. ? Pancreatic.  Cervical, Uterine, and Ovarian Cancer Your health care provider may recommend that you be screened regularly for cancer of the pelvic organs. These include your ovaries, uterus, and vagina. This screening involves a pelvic exam, which includes checking for microscopic changes to the surface of your cervix (Pap test).  For women ages 21-65, health care providers may recommend a pelvic exam and a Pap test every three years. For women ages 38-65,  they may recommend the Pap test and pelvic exam, combined with testing for human papilloma virus (HPV), every five years. Some types of HPV increase your risk of cervical cancer. Testing for HPV may also be done on women of any age who have unclear Pap test results.  Other health care providers may not recommend any screening for nonpregnant women who are considered low risk for pelvic cancer and have no symptoms. Ask your health care provider if a screening pelvic exam is right for you.  If you have had past treatment for cervical cancer or a condition that could lead to cancer, you need Pap tests and screening for cancer for at least 20 years after your treatment. If Pap tests have been discontinued for you, your risk factors (such as having a new sexual partner) need to be reassessed to determine if you should start having screenings again. Some women have medical problems that increase the chance of getting cervical cancer. In these cases, your health care provider may recommend that you have screening and Pap tests more often.  If you have a family history of uterine cancer or ovarian cancer, talk with your health care provider about genetic screening.  If you have vaginal bleeding after reaching menopause, tell your health care provider.  There are currently no reliable tests available to screen for ovarian cancer.  Lung Cancer Lung  cancer screening is recommended for adults 51-72 years old who are at high risk for lung cancer because of a history of smoking. A yearly low-dose CT scan of the lungs is recommended if you:  Currently smoke.  Have a history of at least 30 pack-years of smoking and you currently smoke or have quit within the past 15 years. A pack-year is smoking an average of one pack of cigarettes per day for one year.  Yearly screening should:  Continue until it has been 15 years since you quit.  Stop if you develop a health problem that would prevent you from having lung  cancer treatment.  Colorectal Cancer  This type of cancer can be detected and can often be prevented.  Routine colorectal cancer screening usually begins at age 5 and continues through age 94.  If you have risk factors for colon cancer, your health care provider may recommend that you be screened at an earlier age.  If you have a family history of colorectal cancer, talk with your health care provider about genetic screening.  Your health care provider may also recommend using home test kits to check for hidden blood in your stool.  A small camera at the end of a tube can be used to examine your colon directly (sigmoidoscopy or colonoscopy). This is done to check for the earliest forms of colorectal cancer.  Direct examination of the colon should be repeated every 5-10 years until age 46. However, if early forms of precancerous polyps or small growths are found or if you have a family history or genetic risk for colorectal cancer, you may need to be screened more often.  Skin Cancer  Check your skin from head to toe regularly.  Monitor any moles. Be sure to tell your health care provider: ? About any new moles or changes in moles, especially if there is a change in a mole's shape or color. ? If you have a mole that is larger than the size of a pencil eraser.  If any of your family members has a history of skin cancer, especially at a young age, talk with your health care provider about genetic screening.  Always use sunscreen. Apply sunscreen liberally and repeatedly throughout the day.  Whenever you are outside, protect yourself by wearing long sleeves, pants, a wide-brimmed hat, and sunglasses.  What should I know about osteoporosis? Osteoporosis is a condition in which bone destruction happens more quickly than new bone creation. After menopause, you may be at an increased risk for osteoporosis. To help prevent osteoporosis or the bone fractures that can happen because of  osteoporosis, the following is recommended:  If you are 22-42 years old, get at least 1,000 mg of calcium and at least 600 mg of vitamin D per day.  If you are older than age 29 but younger than age 97, get at least 1,200 mg of calcium and at least 600 mg of vitamin D per day.  If you are older than age 42, get at least 1,200 mg of calcium and at least 800 mg of vitamin D per day.  Smoking and excessive alcohol intake increase the risk of osteoporosis. Eat foods that are rich in calcium and vitamin D, and do weight-bearing exercises several times each week as directed by your health care provider. What should I know about how menopause affects my mental health? Depression may occur at any age, but it is more common as you become older. Common symptoms of depression include:  Low or sad mood.  Changes in sleep patterns.  Changes in appetite or eating patterns.  Feeling an overall lack of motivation or enjoyment of activities that you previously enjoyed.  Frequent crying spells.  Talk with your health care provider if you think that you are experiencing depression. What should I know about immunizations? It is important that you get and maintain your immunizations. These include:  Tetanus, diphtheria, and pertussis (Tdap) booster vaccine.  Influenza every year before the flu season begins.  Pneumonia vaccine.  Shingles vaccine.  Your health care provider may also recommend other immunizations. This information is not intended to replace advice given to you by your health care provider. Make sure you discuss any questions you have with your health care provider. Document Released: 06/13/2005 Document Revised: 11/09/2015 Document Reviewed: 01/23/2015 Elsevier Interactive Patient Education  2018 [employer].

## 2017-04-27 DIAGNOSIS — J019 Acute sinusitis, unspecified: Secondary | ICD-10-CM | POA: Diagnosis not present

## 2017-05-01 ENCOUNTER — Ambulatory Visit
Admission: RE | Admit: 2017-05-01 | Discharge: 2017-05-01 | Disposition: A | Discharge status: Home / Self Care | Payer: Medicare HMO | Source: Ambulatory Visit | Attending: Endocrinology | Admitting: Endocrinology

## 2017-05-01 DIAGNOSIS — E042 Nontoxic multinodular goiter: Secondary | ICD-10-CM | POA: Diagnosis not present

## 2017-05-01 DIAGNOSIS — E049 Nontoxic goiter, unspecified: Secondary | ICD-10-CM

## 2017-05-05 DIAGNOSIS — M8580 Other specified disorders of bone density and structure, unspecified site: Secondary | ICD-10-CM

## 2017-05-05 HISTORY — DX: Other specified disorders of bone density and structure, unspecified site: M85.80

## 2017-05-07 ENCOUNTER — Other Ambulatory Visit: Payer: Self-pay | Admitting: Gynecology

## 2017-05-07 DIAGNOSIS — Z13820 Encounter for screening for osteoporosis: Secondary | ICD-10-CM

## 2017-05-08 ENCOUNTER — Telehealth: Payer: Self-pay | Admitting: Family Medicine

## 2017-05-11 NOTE — Telephone Encounter (Signed)
Last refill 04/10/17 and last office visit with Dr Elease Hashimoto 02/03/17.   We've discussed avoiding regular use and would like for her to taper off completely Okay to refill?

## 2017-05-12 ENCOUNTER — Telehealth: Payer: Self-pay | Admitting: Family Medicine

## 2017-05-12 NOTE — Telephone Encounter (Signed)
Copied from [place] [id number]. Topic: Quick Communication - Rx Refill/Question >> May 12, 2017 11:08 AM Oliver Pila B wrote: Has the patient contacted their pharmacy? yes  Pt states she called yesterday for a refill request for ALPRAZolam Duanne Moron) 0.5 MG tablet [[id number]] but nothing is documented that pt called, pt is going out of town on Thursday, contact pt or pharmacy if needed

## 2017-05-12 NOTE — Telephone Encounter (Signed)
We had recommended coming off this.  Apparently still taking fairly regularly.  Decline.  Do not recommend frequent prn use.

## 2017-05-12 NOTE — Telephone Encounter (Signed)
Found telephone encounter for 1.4.19, but pt states pharmacy is waiting for refill request from pcp

## 2017-05-12 NOTE — Telephone Encounter (Signed)
Waiting on Dr Burchette's response from refill request

## 2017-05-13 ENCOUNTER — Telehealth: Payer: Self-pay | Admitting: Family Medicine

## 2017-05-13 NOTE — Telephone Encounter (Signed)
Denial sent to pharmacy per Dr Elease Hashimoto.  See Rx request

## 2017-05-13 NOTE — Telephone Encounter (Signed)
Copied from [place] [id number]. Topic: Quick Communication - Rx Refill/Question >> May 13, 2017 12:07 PM Percell Belt A wrote: Pt called in said specialist called in her xanex for this month and she wanted to notify the office that she was looking for new doctor

## 2017-05-13 NOTE — Telephone Encounter (Signed)
Denial was sent to the pharmacy.  Okay to refill?

## 2017-05-13 NOTE — Telephone Encounter (Signed)
Rx given by specialist.  See phone note.  rx not called in.

## 2017-05-13 NOTE — Telephone Encounter (Signed)
The problem is she is not tapering off.  We discussed this months ago.  Refill #15 but she should not be taking regularly.

## 2017-05-13 NOTE — Telephone Encounter (Signed)
°  Relation to pt: self  Call back number: [phone]  Pharmacy: [place],  - [address] AT [place]  Reason for call:  Patient states she has "mast cell activation syndrome" and she would like to continue prn or tamper off, patient would like to speak with nurse today. Patient states she's driving to [place] tomorrow, please advise

## 2017-05-14 ENCOUNTER — Encounter (INDEPENDENT_AMBULATORY_CARE_PROVIDER_SITE_OTHER): Payer: Medicare HMO | Admitting: Ophthalmology

## 2017-05-21 ENCOUNTER — Encounter (INDEPENDENT_AMBULATORY_CARE_PROVIDER_SITE_OTHER): Payer: Medicare HMO | Admitting: Ophthalmology

## 2017-05-22 DIAGNOSIS — K5229 Other allergic and dietetic gastroenteritis and colitis: Secondary | ICD-10-CM | POA: Diagnosis not present

## 2017-05-22 DIAGNOSIS — N951 Menopausal and female climacteric states: Secondary | ICD-10-CM | POA: Diagnosis not present

## 2017-05-22 DIAGNOSIS — R5382 Chronic fatigue, unspecified: Secondary | ICD-10-CM | POA: Diagnosis not present

## 2017-05-22 DIAGNOSIS — Z79899 Other long term (current) drug therapy: Secondary | ICD-10-CM | POA: Diagnosis not present

## 2017-05-22 DIAGNOSIS — E039 Hypothyroidism, unspecified: Secondary | ICD-10-CM | POA: Diagnosis not present

## 2017-05-22 DIAGNOSIS — D8940 Mast cell activation, unspecified: Secondary | ICD-10-CM | POA: Diagnosis not present

## 2017-05-22 DIAGNOSIS — E233 Hypothalamic dysfunction, not elsewhere classified: Secondary | ICD-10-CM | POA: Diagnosis not present

## 2017-05-22 DIAGNOSIS — I159 Secondary hypertension, unspecified: Secondary | ICD-10-CM | POA: Diagnosis not present

## 2017-05-22 DIAGNOSIS — G619 Inflammatory polyneuropathy, unspecified: Secondary | ICD-10-CM | POA: Diagnosis not present

## 2017-05-22 DIAGNOSIS — D831 Common variable immunodeficiency with predominant immunoregulatory T-cell disorders: Secondary | ICD-10-CM | POA: Diagnosis not present

## 2017-05-22 DIAGNOSIS — R6510 Systemic inflammatory response syndrome (SIRS) of non-infectious origin without acute organ dysfunction: Secondary | ICD-10-CM | POA: Diagnosis not present

## 2017-05-22 DIAGNOSIS — E871 Hypo-osmolality and hyponatremia: Secondary | ICD-10-CM | POA: Diagnosis not present

## 2017-05-22 DIAGNOSIS — R631 Polydipsia: Secondary | ICD-10-CM | POA: Diagnosis not present

## 2017-05-22 DIAGNOSIS — E559 Vitamin D deficiency, unspecified: Secondary | ICD-10-CM | POA: Diagnosis not present

## 2017-05-26 ENCOUNTER — Other Ambulatory Visit: Payer: Self-pay | Admitting: Gynecology

## 2017-05-26 ENCOUNTER — Encounter: Payer: Self-pay | Admitting: Gynecology

## 2017-05-26 ENCOUNTER — Ambulatory Visit (INDEPENDENT_AMBULATORY_CARE_PROVIDER_SITE_OTHER): Payer: Medicare HMO

## 2017-05-26 DIAGNOSIS — M8589 Other specified disorders of bone density and structure, multiple sites: Secondary | ICD-10-CM

## 2017-05-26 DIAGNOSIS — Z13820 Encounter for screening for osteoporosis: Secondary | ICD-10-CM | POA: Diagnosis not present

## 2017-06-12 DIAGNOSIS — J019 Acute sinusitis, unspecified: Secondary | ICD-10-CM | POA: Diagnosis not present

## 2017-06-14 DIAGNOSIS — R531 Weakness: Secondary | ICD-10-CM | POA: Diagnosis not present

## 2017-06-14 DIAGNOSIS — E871 Hypo-osmolality and hyponatremia: Secondary | ICD-10-CM | POA: Diagnosis not present

## 2017-07-09 DIAGNOSIS — M62838 Other muscle spasm: Secondary | ICD-10-CM | POA: Diagnosis not present

## 2017-07-09 DIAGNOSIS — G908 Other disorders of autonomic nervous system: Secondary | ICD-10-CM | POA: Diagnosis not present

## 2017-07-09 DIAGNOSIS — E538 Deficiency of other specified B group vitamins: Secondary | ICD-10-CM | POA: Diagnosis not present

## 2017-07-09 DIAGNOSIS — E012 Iodine-deficiency related (endemic) goiter, unspecified: Secondary | ICD-10-CM | POA: Diagnosis not present

## 2017-07-09 DIAGNOSIS — R69 Illness, unspecified: Secondary | ICD-10-CM | POA: Diagnosis not present

## 2017-07-09 DIAGNOSIS — F430 Acute stress reaction: Secondary | ICD-10-CM | POA: Diagnosis not present

## 2017-07-09 DIAGNOSIS — M797 Fibromyalgia: Secondary | ICD-10-CM | POA: Diagnosis not present

## 2017-07-09 DIAGNOSIS — E7841 Elevated Lipoprotein(a): Secondary | ICD-10-CM | POA: Diagnosis not present

## 2017-07-13 DIAGNOSIS — D8940 Mast cell activation, unspecified: Secondary | ICD-10-CM | POA: Diagnosis not present

## 2017-07-13 DIAGNOSIS — R5382 Chronic fatigue, unspecified: Secondary | ICD-10-CM | POA: Diagnosis not present

## 2017-07-13 DIAGNOSIS — Z7689 Persons encountering health services in other specified circumstances: Secondary | ICD-10-CM | POA: Diagnosis not present

## 2017-07-13 DIAGNOSIS — I159 Secondary hypertension, unspecified: Secondary | ICD-10-CM | POA: Diagnosis not present

## 2017-07-13 DIAGNOSIS — E233 Hypothalamic dysfunction, not elsewhere classified: Secondary | ICD-10-CM | POA: Diagnosis not present

## 2017-07-13 DIAGNOSIS — G619 Inflammatory polyneuropathy, unspecified: Secondary | ICD-10-CM | POA: Diagnosis not present

## 2017-07-17 ENCOUNTER — Other Ambulatory Visit: Payer: Self-pay | Admitting: *Deleted

## 2017-07-17 DIAGNOSIS — R7989 Other specified abnormal findings of blood chemistry: Secondary | ICD-10-CM

## 2017-07-20 DIAGNOSIS — K5229 Other allergic and dietetic gastroenteritis and colitis: Secondary | ICD-10-CM | POA: Diagnosis not present

## 2017-08-20 DIAGNOSIS — E7841 Elevated Lipoprotein(a): Secondary | ICD-10-CM | POA: Diagnosis not present

## 2017-08-20 DIAGNOSIS — E7849 Other hyperlipidemia: Secondary | ICD-10-CM | POA: Diagnosis not present

## 2017-08-20 DIAGNOSIS — T7840XA Allergy, unspecified, initial encounter: Secondary | ICD-10-CM | POA: Diagnosis not present

## 2017-08-20 DIAGNOSIS — J309 Allergic rhinitis, unspecified: Secondary | ICD-10-CM | POA: Diagnosis not present

## 2017-08-20 DIAGNOSIS — R030 Elevated blood-pressure reading, without diagnosis of hypertension: Secondary | ICD-10-CM | POA: Diagnosis not present

## 2017-08-20 DIAGNOSIS — R5383 Other fatigue: Secondary | ICD-10-CM | POA: Diagnosis not present

## 2017-08-20 DIAGNOSIS — R69 Illness, unspecified: Secondary | ICD-10-CM | POA: Diagnosis not present

## 2017-08-29 DIAGNOSIS — E860 Dehydration: Secondary | ICD-10-CM | POA: Diagnosis not present

## 2017-08-29 DIAGNOSIS — R29898 Other symptoms and signs involving the musculoskeletal system: Secondary | ICD-10-CM | POA: Diagnosis not present

## 2017-08-29 DIAGNOSIS — J0191 Acute recurrent sinusitis, unspecified: Secondary | ICD-10-CM | POA: Diagnosis not present

## 2017-08-31 DIAGNOSIS — G908 Other disorders of autonomic nervous system: Secondary | ICD-10-CM | POA: Diagnosis not present

## 2017-08-31 DIAGNOSIS — G901 Familial dysautonomia [Riley-Day]: Secondary | ICD-10-CM | POA: Diagnosis not present

## 2017-08-31 DIAGNOSIS — E860 Dehydration: Secondary | ICD-10-CM | POA: Diagnosis not present

## 2017-09-15 DIAGNOSIS — R69 Illness, unspecified: Secondary | ICD-10-CM | POA: Diagnosis not present

## 2017-09-15 DIAGNOSIS — E2740 Unspecified adrenocortical insufficiency: Secondary | ICD-10-CM | POA: Diagnosis not present

## 2017-09-15 DIAGNOSIS — R0600 Dyspnea, unspecified: Secondary | ICD-10-CM | POA: Diagnosis not present

## 2017-09-25 DIAGNOSIS — R7989 Other specified abnormal findings of blood chemistry: Secondary | ICD-10-CM | POA: Diagnosis not present

## 2017-09-25 DIAGNOSIS — E538 Deficiency of other specified B group vitamins: Secondary | ICD-10-CM | POA: Diagnosis not present

## 2017-09-25 DIAGNOSIS — R5382 Chronic fatigue, unspecified: Secondary | ICD-10-CM | POA: Diagnosis not present

## 2017-09-25 DIAGNOSIS — A6920 Lyme disease, unspecified: Secondary | ICD-10-CM | POA: Diagnosis not present

## 2017-09-25 DIAGNOSIS — C161 Malignant neoplasm of fundus of stomach: Secondary | ICD-10-CM | POA: Diagnosis not present

## 2017-09-25 DIAGNOSIS — D849 Immunodeficiency, unspecified: Secondary | ICD-10-CM | POA: Diagnosis not present

## 2017-09-25 DIAGNOSIS — E785 Hyperlipidemia, unspecified: Secondary | ICD-10-CM | POA: Diagnosis not present

## 2017-09-25 DIAGNOSIS — M545 Low back pain: Secondary | ICD-10-CM | POA: Diagnosis not present

## 2017-09-25 DIAGNOSIS — E559 Vitamin D deficiency, unspecified: Secondary | ICD-10-CM | POA: Diagnosis not present

## 2017-09-25 DIAGNOSIS — E279 Disorder of adrenal gland, unspecified: Secondary | ICD-10-CM | POA: Diagnosis not present

## 2017-09-25 DIAGNOSIS — E349 Endocrine disorder, unspecified: Secondary | ICD-10-CM | POA: Diagnosis not present

## 2017-09-30 ENCOUNTER — Other Ambulatory Visit: Payer: Self-pay

## 2017-09-30 ENCOUNTER — Emergency Department (HOSPITAL_BASED_OUTPATIENT_CLINIC_OR_DEPARTMENT_OTHER): Payer: Medicare HMO

## 2017-09-30 ENCOUNTER — Encounter (HOSPITAL_BASED_OUTPATIENT_CLINIC_OR_DEPARTMENT_OTHER): Payer: Self-pay

## 2017-09-30 ENCOUNTER — Emergency Department (HOSPITAL_BASED_OUTPATIENT_CLINIC_OR_DEPARTMENT_OTHER)
Admission: EM | Admit: 2017-09-30 | Discharge: 2017-09-30 | Disposition: A | Discharge status: Home / Self Care | Payer: Medicare HMO | Attending: Emergency Medicine | Admitting: Emergency Medicine

## 2017-09-30 DIAGNOSIS — R1031 Right lower quadrant pain: Secondary | ICD-10-CM | POA: Diagnosis not present

## 2017-09-30 DIAGNOSIS — I10 Essential (primary) hypertension: Secondary | ICD-10-CM | POA: Diagnosis not present

## 2017-09-30 DIAGNOSIS — K5730 Diverticulosis of large intestine without perforation or abscess without bleeding: Secondary | ICD-10-CM | POA: Diagnosis not present

## 2017-09-30 DIAGNOSIS — R197 Diarrhea, unspecified: Secondary | ICD-10-CM | POA: Insufficient documentation

## 2017-09-30 HISTORY — DX: Diverticulitis of intestine, part unspecified, without perforation or abscess without bleeding: K57.92

## 2017-09-30 LAB — COMPREHENSIVE METABOLIC PANEL
ALT: 16 U/L (ref 14–54)
AST: 19 U/L (ref 15–41)
Albumin: 3.8 g/dL (ref 3.5–5.0)
Alkaline Phosphatase: 61 U/L (ref 38–126)
Anion gap: 7 (ref 5–15)
BILIRUBIN TOTAL: 0.8 mg/dL (ref 0.3–1.2)
BUN: 12 mg/dL (ref 6–20)
CO2: 26 mmol/L (ref 22–32)
Calcium: 8.8 mg/dL — ABNORMAL LOW (ref 8.9–10.3)
Chloride: 105 mmol/L (ref 101–111)
Creatinine, Ser: 0.74 mg/dL (ref 0.44–1.00)
GFR calc Af Amer: >60 mL/min (ref >60)
Glucose, Bld: 100 mg/dL — ABNORMAL HIGH (ref 65–99)
POTASSIUM: 3.4 mmol/L — AB (ref 3.5–5.1)
Sodium: 138 mmol/L (ref 135–145)
TOTAL PROTEIN: 7 g/dL (ref 6.5–8.1)

## 2017-09-30 LAB — URINALYSIS, ROUTINE W REFLEX MICROSCOPIC
Bilirubin Urine: NEGATIVE
Glucose, UA: NEGATIVE mg/dL
KETONES UR: NEGATIVE mg/dL
LEUKOCYTES UA: NEGATIVE
NITRITE: NEGATIVE
PH: 7 (ref 5.0–8.0)
Protein, ur: NEGATIVE mg/dL

## 2017-09-30 LAB — CBC
HEMATOCRIT: 36.6 % (ref 36.0–46.0)
Hemoglobin: 12.4 g/dL (ref 12.0–15.0)
MCH: 32.5 pg (ref 26.0–34.0)
MCHC: 33.9 g/dL (ref 30.0–36.0)
MCV: 96.1 fL (ref 78.0–100.0)
Platelets: 223 10*3/uL (ref 150–400)
RBC: 3.81 MIL/uL — AB (ref 3.87–5.11)
RDW: 12.6 % (ref 11.5–15.5)
WBC: 5 10*3/uL (ref 4.0–10.5)

## 2017-09-30 LAB — LIPASE, BLOOD: Lipase: 29 U/L (ref 11–51)

## 2017-09-30 LAB — URINALYSIS, MICROSCOPIC (REFLEX): WBC UA: NONE SEEN WBC/hpf (ref 0–5)

## 2017-09-30 MED ORDER — ONDANSETRON 4 MG PO TBDP: 4.0000 mg | ORAL_TABLET | Freq: Three times a day (TID) | ORAL | 0 refills | Status: DC | PRN

## 2017-09-30 MED ORDER — LOPERAMIDE HCL 2 MG PO CAPS: 2.0000 mg | ORAL_CAPSULE | Freq: Four times a day (QID) | ORAL | 0 refills | Status: DC | PRN

## 2017-09-30 MED ORDER — DICYCLOMINE HCL 20 MG PO TABS: 20.0000 mg | ORAL_TABLET | Freq: Three times a day (TID) | ORAL | 0 refills | Status: DC

## 2017-09-30 MED ORDER — SODIUM CHLORIDE 0.9 % IV BOLUS
500.0000 mL | Freq: Once | INTRAVENOUS | Status: AC
  Administered 2017-09-30: 500 mL via INTRAVENOUS

## 2017-09-30 MED ORDER — IBUPROFEN 800 MG PO TABS: 800.0000 mg | ORAL_TABLET | Freq: Three times a day (TID) | ORAL | 0 refills | Status: DC | PRN

## 2017-09-30 MED ORDER — KETOROLAC TROMETHAMINE 30 MG/ML IJ SOLN
30.0000 mg | Freq: Once | INTRAMUSCULAR | Status: AC
  Administered 2017-09-30: 30 mg via INTRAVENOUS
  Filled 2017-09-30: qty 1

## 2017-09-30 NOTE — ED Triage Notes (Signed)
C/o RLQ pain x 1 week-diarrhea started yesterday-states feel like a "diverticulitis flare"-NAD-steady gait

## 2017-09-30 NOTE — ED Provider Notes (Signed)
Emergency Department Provider Note   I have reviewed the triage vital signs and the nursing notes.   HISTORY  Chief Complaint Abdominal Pain   HPI Katie [name] is a 62 y.o. female with PMH of Diverticulitis, HTN, and Mast cell activation syndrome presents to the emergency department for evaluation of right lower quadrant abdominal pain.  Patient had some intermittent right lower back discomfort last week and saw her PCP who gave a watch and wait prescription for UTI.  Patient began taking this prescription 2 days ago when symptoms continued but denies any dysuria, hesitancy, urgency.  No vaginal bleeding or discharge.  Patient has developed right lower quadrant abdominal pain over the past 36 hours that feels similar to her diverticulitis in the past, however, that is typically on the left side for her.  Associated nonbloody diarrhea.  No nausea or vomiting. No fever or chills.    Past Medical History:  Diagnosis Date  . Anxiety   . Diverticulitis   . Fibromyalgia   . Hypertension   . Lyme disease   . Mast cell activation syndrome ([place])   . Osteopenia 05/2017   T score -2.2 FRAX 11% / 1.7%  . POTS (postural orthostatic tachycardia syndrome)     Patient Active Problem List   Diagnosis Date Noted  . Diarrhea 11/14/2015  . Acute gastroenteritis 11/14/2015  . Dehydration   . Enteritis 11/13/2015  . Hyponatremia 11/13/2015  . Elevated blood pressure reading 07/14/2013  . Lyme disease 10/25/2012  . Chronic fatigue syndrome 10/25/2012  . Anxiety and depression 10/25/2012  . Fibromyalgia 10/25/2012  . Hormone replacement therapy - followed by Dr. Lonia Skinner in gyn and by [place] 10/25/2012    Past Surgical History:  Procedure Laterality Date  . Cataract Removal With Lens Implant Placement Left 03-06-2015  . CERVICAL BIOPSY  W/ LOOP ELECTRODE EXCISION  04/19/2004   HGSIL CIN II  MARGINS NEGATIVE  . COLPOSCOPY  02/19/2004   HGSIL CIN II    Current  Outpatient Rx  . Order #: [id number] Class: Phone In  . Order #: [id number] Class: Historical Med  . Order #: [id number] Class: Historical Med  . Order #: [id number] Class: Historical Med  . Order #: [id number] Class: Print  . Order #: [id number] Class: Print  . Order #: [id number] Class: Historical Med  . Order #: [id number] Class: Print  . Order #: [id number] Class: Historical Med  . Order #: [id number] Class: Historical Med  . Order #: [id number] Class: Historical Med  . Order #: [id number] Class: Historical Med  . Order #: [id number] Class: Print  . Order #: [id number] Class: Historical Med  . Order #: [id number] Class: Historical Med  . Order #: [id number] Class: Historical Med  . Order #: [id number] Class: Historical Med  . Order #: [id number] Class: Historical Med  . Order #: [id number] Class: Historical Med  . Order #: [id number] Class: Historical Med    Allergies Isovue [iopamidol]; Azithromycin; Ciprofloxacin; Flagyl [metronidazole]; Prednisone; Sulfa antibiotics; Sulfacetamide sodium; and Wheat bran  Family History  Problem Relation Age of Onset  . Heart disease Mother        CHF  . Hypertension Mother   . Cancer Father 23       lung cancer  . Hypertension Father   . Hypertension Paternal Grandmother   . Stroke Paternal Grandmother   . Hypertension Paternal Grandfather   . Mental retardation Son     Social History Social History   Tobacco Use  . Smoking status: Never Smoker  . Smokeless tobacco: Never Used  Substance Use Topics  .  Alcohol use: Yes    Comment: occ  . Drug use: No    Review of Systems  Constitutional: No fever/chills Eyes: No visual changes. ENT: No sore throat. Cardiovascular: Denies chest pain. Respiratory: Denies shortness of breath. Gastrointestinal: Positive RLQ abdominal pain. abdominal pain.  No nausea, no vomiting.  No diarrhea.  No constipation. Genitourinary: Negative for dysuria. Musculoskeletal: Negative for back pain. Skin: Negative for rash. Neurological:  Negative for headaches, focal weakness or numbness.  10-point ROS otherwise negative.  ____________________________________________   PHYSICAL EXAM:  VITAL SIGNS: ED Triage Vitals  Enc Vitals Group     BP 09/30/17 1503 (!) 166/98     Pulse Rate 09/30/17 1503 82     Resp 09/30/17 1503 20     Temp 09/30/17 1503 98.2 F (36.8 C)     Temp Source 09/30/17 1503 Oral     SpO2 09/30/17 1503 99 %     Weight 09/30/17 1504 151 lb (68.5 kg)     Height 09/30/17 1504 5\' 7"  (1.702 m)     Pain Score 09/30/17 1501 2    Constitutional: Alert and oriented. Well appearing and in no acute distress. Eyes: Conjunctivae are normal.  Head: Atraumatic. Nose: No congestion/rhinnorhea. Mouth/Throat: Mucous membranes are moist.  Neck: No stridor. Cardiovascular: Normal rate, regular rhythm. Good peripheral circulation. Grossly normal heart sounds.   Respiratory: Normal respiratory effort.  No retractions. Lungs CTAB. Gastrointestinal: Soft with focal RLQ tenderness. No rebound or guarding. No distention.  Musculoskeletal: No lower extremity tenderness nor edema. No gross deformities of extremities. Neurologic:  Normal speech and language. No gross focal neurologic deficits are appreciated.  Skin:  Skin is warm, dry and intact. No rash noted.   ____________________________________________   LABS (all labs ordered are listed, but only abnormal results are displayed)  Labs Reviewed  COMPREHENSIVE METABOLIC PANEL - Abnormal; Notable for the following components:      Result Value   Potassium 3.4 (*)    Glucose, Bld 100 (*)    Calcium 8.8 (*)    All other components within normal limits  CBC - Abnormal; Notable for the following components:   RBC 3.81 (*)    All other components within normal limits  URINALYSIS, ROUTINE W REFLEX MICROSCOPIC - Abnormal; Notable for the following components:   Color, Urine STRAW (*)    Specific Gravity, Urine <1.005 (*)    Hgb urine dipstick TRACE (*)    All  other components within normal limits  URINALYSIS, MICROSCOPIC (REFLEX) - Abnormal; Notable for the following components:   Bacteria, UA RARE (*)    All other components within normal limits  LIPASE, BLOOD   ____________________________________________  RADIOLOGY  Ct Abdomen Pelvis Wo Contrast  Result Date: 09/30/2017 CLINICAL DATA:  Right lower quadrant pain x1 week, diarrhea EXAM: CT ABDOMEN AND PELVIS WITHOUT CONTRAST TECHNIQUE: Multidetector CT imaging of the abdomen and pelvis was performed following the standard protocol without IV contrast. COMPARISON:  02/27/2017 FINDINGS: Lower chest: No acute abnormality. Hepatobiliary: No focal liver abnormality is seen. No gallstones, gallbladder wall thickening, or biliary dilatation. Pancreas: Unremarkable. No pancreatic ductal dilatation or surrounding inflammatory changes. Spleen: Normal in size without focal abnormality. Accessory splenule. Adrenals/Urinary Tract: Adrenal glands are unremarkable. Kidneys are normal, without renal calculi, focal lesion, or hydronephrosis. Bladder is unremarkable. Stomach/Bowel: Stomach nondistended. Small bowel decompressed. Normal appendix. The colon is nondilated. Scattered colonic diverticula most numerous in the sigmoid segment without significant adjacent inflammatory/edematous change or evident abscess. Vascular/Lymphatic: No significant vascular  findings are present. Bilateral pelvic phleboliths. No enlarged abdominal or pelvic lymph nodes. Reproductive: Uterus and bilateral adnexa are unremarkable. Other: No ascites.  No free air. Musculoskeletal: Facet DJD L4-5 allows early grade 1 anterolisthesis. Negative for fracture or worrisome bone lesion. IMPRESSION: 1. No acute findings.  Normal appendix. 2. Colonic diverticulosis without CT findings of diverticulitis or abscess. Electronically Signed   By: Lucrezia Europe M.D.   On: 09/30/2017 17:57     ____________________________________________   PROCEDURES  Procedure(s) performed:   Procedures  None ____________________________________________   INITIAL IMPRESSION / ASSESSMENT AND PLAN / ED COURSE  Pertinent labs & imaging results that were available during my care of the patient were reviewed by me and considered in my medical decision making (see chart for details).  Patient presents to the emergency department for evaluation of right lower quadrant pain for the past week with associated diarrhea.  She has had diverticulitis on the left side with similar pain in the past but never on the right.  CT imaging ordered.  The patient reports to me a contrast allergy related to her mast cell activation syndrome to both IV and oral contrast.  For premedication but the patient refuses contrast due to her allergy symptoms.  I have placed a noncontrast CT order.  No leukocytosis and review of the lab work.  Plan for Toradol, IV fluids, reassess.  Patient feeling slightly better after meds. CT reviewed with no acute findings. Will have patient continue abx that she started 2 days ago treating UTI.   At this time, I do not feel there is any life-threatening condition present. I have reviewed and discussed all results (EKG, imaging, lab, urine as appropriate), exam findings with patient. I have reviewed nursing notes and appropriate previous records.  I feel the patient is safe to be discharged home without further emergent workup. Discussed usual and customary return precautions. Patient and family (if present) verbalize understanding and are comfortable with this plan.  Patient will follow-up with their primary care provider. If they do not have a primary care provider, information for follow-up has been provided to them. All questions have been answered.  ____________________________________________  FINAL CLINICAL IMPRESSION(S) / ED DIAGNOSES  Final diagnoses:  Right lower quadrant  abdominal pain     MEDICATIONS GIVEN DURING THIS VISIT:  Medications  sodium chloride 0.9 % bolus 500 mL (0 mLs Intravenous Stopped 09/30/17 1820)  ketorolac (TORADOL) 30 MG/ML injection 30 mg (30 mg Intravenous Given 09/30/17 1712)     NEW OUTPATIENT MEDICATIONS STARTED DURING THIS VISIT:  Discharge Medication List as of 09/30/2017  6:34 PM    START taking these medications   Details  dicyclomine (BENTYL) 20 MG tablet Take 1 tablet (20 mg total) by mouth 3 (three) times daily before meals., Starting Wed 09/30/2017, Print    ibuprofen (ADVIL,MOTRIN) 800 MG tablet Take 1 tablet (800 mg total) by mouth every 8 (eight) hours as needed., Starting Wed 09/30/2017, Print    loperamide (IMODIUM) 2 MG capsule Take 1 capsule (2 mg total) by mouth 4 (four) times daily as needed for diarrhea or loose stools., Starting Wed 09/30/2017, Print    ondansetron (ZOFRAN ODT) 4 MG disintegrating tablet Take 1 tablet (4 mg total) by mouth every 8 (eight) hours as needed for nausea or vomiting., Starting Wed 09/30/2017, Print        Note:  This document was prepared using Dragon voice recognition software and may include unintentional dictation errors.  Nanda Quinton, MD Emergency  Medicine    Long, [facility], MD 10/01/17 [phone]

## 2017-09-30 NOTE — Discharge Instructions (Signed)
You have been seen in the Emergency Department (ED) for abdominal pain.  Your evaluation did not identify a clear cause of your symptoms but was generally reassuring.  Continue your antibiotics and take the prescribed medication as needed.   Please follow up as instructed above regarding today?s emergent visit and the symptoms that are bothering you.  Return to the ED if your abdominal pain worsens or fails to improve, you develop bloody vomiting, bloody diarrhea, you are unable to tolerate fluids due to vomiting, fever greater than 101, or other symptoms that concern you.

## 2017-10-05 DIAGNOSIS — A89 Unspecified viral infection of central nervous system: Secondary | ICD-10-CM | POA: Diagnosis not present

## 2017-10-05 DIAGNOSIS — R5382 Chronic fatigue, unspecified: Secondary | ICD-10-CM | POA: Diagnosis not present

## 2017-10-05 DIAGNOSIS — M2550 Pain in unspecified joint: Secondary | ICD-10-CM | POA: Diagnosis not present

## 2017-10-23 ENCOUNTER — Encounter (HOSPITAL_BASED_OUTPATIENT_CLINIC_OR_DEPARTMENT_OTHER): Payer: Self-pay | Admitting: *Deleted

## 2017-10-23 ENCOUNTER — Other Ambulatory Visit: Payer: Self-pay

## 2017-10-23 ENCOUNTER — Emergency Department (HOSPITAL_BASED_OUTPATIENT_CLINIC_OR_DEPARTMENT_OTHER)
Admission: EM | Admit: 2017-10-23 | Discharge: 2017-10-23 | Disposition: A | Discharge status: Home / Self Care | Payer: Medicare HMO | Attending: Emergency Medicine | Admitting: Emergency Medicine

## 2017-10-23 DIAGNOSIS — E860 Dehydration: Secondary | ICD-10-CM | POA: Insufficient documentation

## 2017-10-23 DIAGNOSIS — A09 Infectious gastroenteritis and colitis, unspecified: Secondary | ICD-10-CM | POA: Diagnosis not present

## 2017-10-23 DIAGNOSIS — R197 Diarrhea, unspecified: Secondary | ICD-10-CM | POA: Insufficient documentation

## 2017-10-23 DIAGNOSIS — F329 Major depressive disorder, single episode, unspecified: Secondary | ICD-10-CM | POA: Insufficient documentation

## 2017-10-23 DIAGNOSIS — I10 Essential (primary) hypertension: Secondary | ICD-10-CM | POA: Insufficient documentation

## 2017-10-23 DIAGNOSIS — F419 Anxiety disorder, unspecified: Secondary | ICD-10-CM | POA: Diagnosis not present

## 2017-10-23 DIAGNOSIS — R69 Illness, unspecified: Secondary | ICD-10-CM | POA: Diagnosis not present

## 2017-10-23 LAB — URINALYSIS, ROUTINE W REFLEX MICROSCOPIC
BILIRUBIN URINE: NEGATIVE
Glucose, UA: NEGATIVE mg/dL
Hgb urine dipstick: NEGATIVE
Ketones, ur: NEGATIVE mg/dL
Leukocytes, UA: NEGATIVE
Nitrite: NEGATIVE
PROTEIN: NEGATIVE mg/dL
SPECIFIC GRAVITY, URINE: 1.01 (ref 1.005–1.030)
pH: 8 (ref 5.0–8.0)

## 2017-10-23 LAB — CBC WITH DIFFERENTIAL/PLATELET
Basophils Absolute: 0 10*3/uL (ref 0.0–0.1)
Basophils Relative: 1 %
EOS ABS: 0.2 10*3/uL (ref 0.0–0.7)
EOS PCT: 4 %
HCT: 37.5 % (ref 36.0–46.0)
HEMOGLOBIN: 12.6 g/dL (ref 12.0–15.0)
Lymphocytes Relative: 29 %
Lymphs Abs: 1.5 10*3/uL (ref 0.7–4.0)
MCH: 32.1 pg (ref 26.0–34.0)
MCHC: 33.6 g/dL (ref 30.0–36.0)
MCV: 95.4 fL (ref 78.0–100.0)
Monocytes Absolute: 0.5 10*3/uL (ref 0.1–1.0)
Monocytes Relative: 10 %
NEUTROS PCT: 56 %
Neutro Abs: 2.9 10*3/uL (ref 1.7–7.7)
PLATELETS: 239 10*3/uL (ref 150–400)
RBC: 3.93 MIL/uL (ref 3.87–5.11)
RDW: 12.4 % (ref 11.5–15.5)
WBC: 5.1 10*3/uL (ref 4.0–10.5)

## 2017-10-23 LAB — COMPREHENSIVE METABOLIC PANEL
ALK PHOS: 68 U/L (ref 38–126)
ALT: 15 U/L (ref 14–54)
ANION GAP: 7 (ref 5–15)
AST: 20 U/L (ref 15–41)
Albumin: 3.9 g/dL (ref 3.5–5.0)
BUN: 14 mg/dL (ref 6–20)
CO2: 29 mmol/L (ref 22–32)
Calcium: 8.7 mg/dL — ABNORMAL LOW (ref 8.9–10.3)
Chloride: 103 mmol/L (ref 101–111)
Creatinine, Ser: 0.52 mg/dL (ref 0.44–1.00)
Glucose, Bld: 95 mg/dL (ref 65–99)
Potassium: 4.2 mmol/L (ref 3.5–5.1)
SODIUM: 139 mmol/L (ref 135–145)
Total Bilirubin: 0.9 mg/dL (ref 0.3–1.2)
Total Protein: 7.2 g/dL (ref 6.5–8.1)

## 2017-10-23 LAB — LIPASE, BLOOD: Lipase: 33 U/L (ref 11–51)

## 2017-10-23 MED ORDER — SODIUM CHLORIDE 0.9 % IV BOLUS
1000.0000 mL | Freq: Once | INTRAVENOUS | Status: AC
  Administered 2017-10-23: 1000 mL via INTRAVENOUS

## 2017-10-23 NOTE — ED Notes (Signed)
ED Provider at bedside. 

## 2017-10-23 NOTE — ED Triage Notes (Signed)
Pt reports eating a salad last night, and has had 6 episodes of diarrhea since then, last episode just now. Pt states she has taken 1 dose of immodium an hour ago and thinks it is working. She states she has ibs and has had diarrhea in the past and it makes her dehydrated quickly. She is feeling dizzy with position changes, requests iv fluids as she has a trip to [place] planned for later today.

## 2017-10-23 NOTE — Discharge Instructions (Signed)
You were seen in the ED today with diarrhea. Take your medication as prescribed and try to drink plenty of non-sugary fluids. Call your PCP to schedule a follow up appointment as needed. Return to the ED with any new or worsening symptoms.

## 2017-10-23 NOTE — ED Provider Notes (Signed)
Emergency Department Provider Note   I have reviewed the triage vital signs and the nursing notes.   HISTORY  Chief Complaint Diarrhea   HPI Katie Katie [name] is a 62 y.o. female with PMH of diverticulitis, IBS, and mast cell activation syndrome resents to the emergency department for evaluation of diarrhea.  Symptoms began last night after eating a salad.  She has had multiple episodes of nonbloody diarrhea since that time including one this morning.  She has some mild abdominal cramping but no focal pain.  She started feel slightly lightheaded and states she gets dehydrated easily.  She plans on going to [place], [place] later this afternoon for a camping trip and did not want to be dehydrated for that so presented to the emergency department to request IV fluids.  No recent antibiotics.  No nausea or vomiting. No UTI symptoms.   Past Medical History:  Diagnosis Date  . Anxiety   . Diverticulitis   . Fibromyalgia   . Hypertension   . Lyme disease   . Mast cell activation syndrome ([place])   . Osteopenia 05/2017   T score -2.2 FRAX 11% / 1.7%  . POTS (postural orthostatic tachycardia syndrome)     Patient Active Problem List   Diagnosis Date Noted  . Diarrhea 11/14/2015  . Acute gastroenteritis 11/14/2015  . Dehydration   . Enteritis 11/13/2015  . Hyponatremia 11/13/2015  . Elevated blood pressure reading 07/14/2013  . Lyme disease 10/25/2012  . Chronic fatigue syndrome 10/25/2012  . Anxiety and depression 10/25/2012  . Fibromyalgia 10/25/2012  . Hormone replacement therapy - followed by Dr. Lonia Skinner in gyn and by [place] 10/25/2012    Past Surgical History:  Procedure Laterality Date  . Cataract Removal With Lens Implant Placement Left 03-06-2015  . CERVICAL BIOPSY  W/ LOOP ELECTRODE EXCISION  04/19/2004   HGSIL CIN II  MARGINS NEGATIVE  . COLPOSCOPY  02/19/2004   HGSIL CIN II    Allergies Isovue [iopamidol]; Azithromycin; Ciprofloxacin; Flagyl  [metronidazole]; Prednisone; Sulfa antibiotics; Sulfacetamide sodium; and Wheat Katie [name]  Family History  Problem Relation Age of Onset  . Heart disease Mother        CHF  . Hypertension Mother   . Cancer Father 41       lung cancer  . Hypertension Father   . Hypertension Paternal Grandmother   . Stroke Paternal Grandmother   . Hypertension Paternal Grandfather   . Mental retardation Son     Social History Social History   Tobacco Use  . Smoking status: Never Smoker  . Smokeless tobacco: Never Used  Substance Use Topics  . Alcohol use: Yes    Comment: occ  . Drug use: No    Review of Systems  Constitutional: No fever/chills Eyes: No visual changes. ENT: No sore throat. Cardiovascular: Denies chest pain. Respiratory: Denies shortness of breath. Gastrointestinal: No abdominal pain.  No nausea, no vomiting. Positive diarrhea.  No constipation. Genitourinary: Negative for dysuria. Musculoskeletal: Negative for back pain. Skin: Negative for rash. Neurological: Negative for headaches, focal weakness or numbness.  10-point ROS otherwise negative.  ____________________________________________   PHYSICAL EXAM:  VITAL SIGNS: ED Triage Vitals  Enc Vitals Group     BP 10/23/17 0903 (!) 145/98     Pulse Rate 10/23/17 0903 94     Resp 10/23/17 0903 18     Temp 10/23/17 0903 98.8 F (37.1 C)     Temp Source 10/23/17 0903 Oral  SpO2 10/23/17 0903 100 %     Weight 10/23/17 0902 151 lb (68.5 kg)     Height 10/23/17 0902 5\' 7"  (1.702 m)     Pain Score 10/23/17 0908 0   Constitutional: Alert and oriented. Well appearing and in no acute distress. Eyes: Conjunctivae are normal.  Head: Atraumatic. Nose: No congestion/rhinnorhea. Mouth/Throat: Mucous membranes are moist.   Neck: No stridor.   Cardiovascular: Normal rate, regular rhythm. Good peripheral circulation. Grossly normal heart sounds.   Respiratory: Normal respiratory effort.  No retractions. Lungs  CTAB. Gastrointestinal: Soft and nontender. No distention.  Musculoskeletal: No lower extremity tenderness nor edema. No gross deformities of extremities. Neurologic:  Normal speech and language. No gross focal neurologic deficits are appreciated.  Skin:  Skin is warm, dry and intact. No rash noted.  ____________________________________________   LABS (all labs ordered are listed, but only abnormal results are displayed)  Labs Reviewed  COMPREHENSIVE METABOLIC PANEL - Abnormal; Notable for the following components:      Result Value   Calcium 8.7 (*)    All other components within normal limits  URINALYSIS, ROUTINE W REFLEX MICROSCOPIC - Abnormal; Notable for the following components:   Color, Urine STRAW (*)    All other components within normal limits  LIPASE, BLOOD  CBC WITH DIFFERENTIAL/PLATELET   ____________________________________________   PROCEDURES  Procedure(s) performed:   Procedures  None ____________________________________________   INITIAL IMPRESSION / ASSESSMENT AND PLAN / ED COURSE  Pertinent labs & imaging results that were available during my care of the patient were reviewed by me and considered in my medical decision making (see chart for details).  Patient presents to the emergency department for evaluation of nonbloody diarrhea.  She has a history of IBS and becomes dehydrated easily.  Plan for baseline labs to assess electrolytes and IV fluids.  Patient has no focal abdominal tenderness to necessitate CT imaging at this time.   Labs reviewed with no acute findings. Patient feeling much better after IVF. Plan for discharge.   At this time, I do not feel there is any life-threatening condition present. I have reviewed and discussed all results (EKG, imaging, lab, urine as appropriate), exam findings with patient. I have reviewed nursing notes and appropriate previous records.  I feel the patient is safe to be discharged home without further emergent  workup. Discussed usual and customary return precautions. Patient and family (if present) verbalize understanding and are comfortable with this plan.  Patient will follow-up with their primary care provider. If they do not have a primary care provider, information for follow-up has been provided to them. All questions have been answered.   ____________________________________________  FINAL CLINICAL IMPRESSION(S) / ED DIAGNOSES  Final diagnoses:  Diarrhea of presumed infectious origin  Dehydration     MEDICATIONS GIVEN DURING THIS VISIT:  Medications  sodium chloride 0.9 % bolus 1,000 mL (0 mLs Intravenous Stopped 10/23/17 1026)    Note:  This document was prepared using Dragon voice recognition software and may include unintentional dictation errors.  Nanda Quinton, MD Emergency Medicine    Minahil Quinlivan, [facility], MD 10/23/17 [phone]

## 2017-11-09 DIAGNOSIS — J069 Acute upper respiratory infection, unspecified: Secondary | ICD-10-CM | POA: Diagnosis not present

## 2017-11-26 ENCOUNTER — Other Ambulatory Visit: Payer: Self-pay

## 2017-11-26 ENCOUNTER — Encounter (HOSPITAL_BASED_OUTPATIENT_CLINIC_OR_DEPARTMENT_OTHER): Payer: Self-pay | Admitting: Emergency Medicine

## 2017-11-26 ENCOUNTER — Emergency Department (HOSPITAL_BASED_OUTPATIENT_CLINIC_OR_DEPARTMENT_OTHER)
Admission: EM | Admit: 2017-11-26 | Discharge: 2017-11-27 | Disposition: A | Discharge status: Home / Self Care | Payer: Medicare HMO | Attending: Emergency Medicine | Admitting: Emergency Medicine

## 2017-11-26 ENCOUNTER — Emergency Department (HOSPITAL_BASED_OUTPATIENT_CLINIC_OR_DEPARTMENT_OTHER): Payer: Medicare HMO

## 2017-11-26 DIAGNOSIS — Z79899 Other long term (current) drug therapy: Secondary | ICD-10-CM | POA: Insufficient documentation

## 2017-11-26 DIAGNOSIS — R1033 Periumbilical pain: Secondary | ICD-10-CM | POA: Diagnosis not present

## 2017-11-26 DIAGNOSIS — I10 Essential (primary) hypertension: Secondary | ICD-10-CM | POA: Diagnosis not present

## 2017-11-26 DIAGNOSIS — R1013 Epigastric pain: Secondary | ICD-10-CM | POA: Insufficient documentation

## 2017-11-26 DIAGNOSIS — R110 Nausea: Secondary | ICD-10-CM | POA: Diagnosis not present

## 2017-11-26 LAB — URINALYSIS, ROUTINE W REFLEX MICROSCOPIC
Bilirubin Urine: NEGATIVE
Glucose, UA: NEGATIVE mg/dL
Ketones, ur: NEGATIVE mg/dL
LEUKOCYTES UA: NEGATIVE
Nitrite: NEGATIVE
PROTEIN: NEGATIVE mg/dL
Specific Gravity, Urine: <1.005 — ABNORMAL LOW (ref 1.005–1.030)
pH: 7 (ref 5.0–8.0)

## 2017-11-26 LAB — URINALYSIS, MICROSCOPIC (REFLEX): WBC, UA: NONE SEEN WBC/hpf (ref 0–5)

## 2017-11-26 LAB — COMPREHENSIVE METABOLIC PANEL
ALK PHOS: 79 U/L (ref 38–126)
ALT: 17 U/L (ref 0–44)
AST: 20 U/L (ref 15–41)
Albumin: 4.2 g/dL (ref 3.5–5.0)
Anion gap: 10 (ref 5–15)
BUN: 13 mg/dL (ref 8–23)
CALCIUM: 9.5 mg/dL (ref 8.9–10.3)
CO2: 29 mmol/L (ref 22–32)
CREATININE: 0.58 mg/dL (ref 0.44–1.00)
Chloride: 99 mmol/L (ref 98–111)
Glucose, Bld: 98 mg/dL (ref 70–99)
Potassium: 3.4 mmol/L — ABNORMAL LOW (ref 3.5–5.1)
Sodium: 138 mmol/L (ref 135–145)
TOTAL PROTEIN: 8.2 g/dL — AB (ref 6.5–8.1)
Total Bilirubin: 0.6 mg/dL (ref 0.3–1.2)

## 2017-11-26 LAB — CBC
HCT: 42.5 % (ref 36.0–46.0)
Hemoglobin: 14.5 g/dL (ref 12.0–15.0)
MCH: 32.5 pg (ref 26.0–34.0)
MCHC: 34.1 g/dL (ref 30.0–36.0)
MCV: 95.3 fL (ref 78.0–100.0)
PLATELETS: 271 10*3/uL (ref 150–400)
RBC: 4.46 MIL/uL (ref 3.87–5.11)
RDW: 12.5 % (ref 11.5–15.5)
WBC: 10.4 10*3/uL (ref 4.0–10.5)

## 2017-11-26 LAB — LIPASE, BLOOD: LIPASE: 31 U/L (ref 11–51)

## 2017-11-26 MED ORDER — MORPHINE SULFATE (PF) 4 MG/ML IV SOLN
4.0000 mg | Freq: Once | INTRAVENOUS | Status: DC
  Filled 2017-11-26: qty 1

## 2017-11-26 MED ORDER — ONDANSETRON HCL 4 MG/2ML IJ SOLN
4.0000 mg | Freq: Once | INTRAMUSCULAR | Status: AC
  Administered 2017-11-26: 4 mg via INTRAVENOUS
  Filled 2017-11-26: qty 2

## 2017-11-26 MED ORDER — GI COCKTAIL ~~LOC~~
30.0000 mL | Freq: Once | ORAL | Status: AC
  Administered 2017-11-26: 30 mL via ORAL
  Filled 2017-11-26: qty 30

## 2017-11-26 MED ORDER — SODIUM CHLORIDE 0.9 % IV BOLUS
1000.0000 mL | Freq: Once | INTRAVENOUS | Status: AC
  Administered 2017-11-26: 1000 mL via INTRAVENOUS

## 2017-11-26 MED ORDER — KETOROLAC TROMETHAMINE 30 MG/ML IJ SOLN
30.0000 mg | Freq: Once | INTRAMUSCULAR | Status: AC
  Administered 2017-11-26: 30 mg via INTRAVENOUS
  Filled 2017-11-26: qty 1

## 2017-11-26 NOTE — ED Provider Notes (Signed)
[facility] DEPARTMENT Provider Note   CSN: [id number] Arrival date & time: 11/26/17  2107     History   Chief Complaint Chief Complaint  Patient presents with  . Abdominal Pain    HPI Katie [name] is a 62 y.o. female.  The history is provided by the patient and medical records.  Abdominal Pain   This is a new problem. The current episode started 2 days ago. The problem occurs daily. The problem has not changed since onset.The pain is associated with an unknown factor. The pain is located in the epigastric region. The quality of the pain is aching, dull and sharp. The pain is moderate. Associated symptoms include diarrhea and nausea. Pertinent negatives include fever, vomiting, constipation, dysuria, frequency and headaches. The symptoms are aggravated by palpation. Nothing relieves the symptoms. Past workup does not include surgery.    Past Medical History:  Diagnosis Date  . Anxiety   . Diverticulitis   . Fibromyalgia   . Hypertension   . Lyme disease   . Mast cell activation syndrome ([place])   . Osteopenia 05/2017   T score -2.2 FRAX 11% / 1.7%  . POTS (postural orthostatic tachycardia syndrome)     Patient Active Problem List   Diagnosis Date Noted  . Diarrhea 11/14/2015  . Acute gastroenteritis 11/14/2015  . Dehydration   . Enteritis 11/13/2015  . Hyponatremia 11/13/2015  . Elevated blood pressure reading 07/14/2013  . Lyme disease 10/25/2012  . Chronic fatigue syndrome 10/25/2012  . Anxiety and depression 10/25/2012  . Fibromyalgia 10/25/2012  . Hormone replacement therapy - followed by Dr. Lonia Skinner in gyn and by [place] 10/25/2012    Past Surgical History:  Procedure Laterality Date  . Cataract Removal With Lens Implant Placement Left 03-06-2015  . CERVICAL BIOPSY  W/ LOOP ELECTRODE EXCISION  04/19/2004   HGSIL CIN II  MARGINS NEGATIVE  . COLPOSCOPY  02/19/2004   HGSIL CIN II     OB History    Gravida  2     Para  2   Term      Preterm      AB      Living  2     SAB      TAB      Ectopic      Multiple      Live Births               Home Medications    Prior to Admission medications   Medication Sig Start Date End Date Taking? Authorizing Provider  ALPRAZolam Duanne Moron) 0.5 MG tablet Take 1 tablet (0.5 mg total) by mouth 2 (two) times daily as needed for anxiety. 04/10/17   Burchette, Alinda Sierras, MD  cetirizine (ZYRTEC ALLERGY) 10 MG tablet Take 10 mg by mouth daily.    [provider]  Cholecalciferol (VITAMIN D-3) 5000 UNITS TABS Take 5,000 tablets by mouth daily.    [provider]  Coenzyme Q10 (CO Q10 PO) Take 400 mg by mouth daily.    [provider]  dicyclomine (BENTYL) 20 MG tablet Take 1 tablet (20 mg total) by mouth 3 (three) times daily before meals. 09/30/17   Long, Wonda Olds, MD  ibuprofen (ADVIL,MOTRIN) 800 MG tablet Take 1 tablet (800 mg total) by mouth every 8 (eight) hours as needed. 09/30/17   Long, Wonda Olds, MD  Ketotifen Fumarate POWD Take 0.25 mg by mouth.     [provider]  loperamide (IMODIUM) 2 MG capsule Take 1 capsule (2 mg total) by mouth 4 (four) times daily as needed for diarrhea or loose stools. 09/30/17   Long, Wonda Olds, MD  METHYLCOBALAMIN PO Take by mouth.    [provider]  montelukast (SINGULAIR) 10 MG tablet Take 10 mg by mouth at bedtime.    [provider]  Nutritional Supplements (RESTORE-X PO) Take by mouth.    [provider]  Omega-3 Fatty Acids (FISH OIL PO) Take 2 capsules by mouth 2 (two) times daily. BARLEANS FISH OIL    [provider]  ondansetron (ZOFRAN ODT) 4 MG disintegrating tablet Take 1 tablet (4 mg total) by mouth every 8 (eight) hours as needed for nausea or vomiting. 09/30/17   Long, Wonda Olds, MD  OVER THE COUNTER MEDICATION Take 2 capsules by mouth daily. S-Acetyl Glutathione (SYNERGY)    [provider]  OVER THE COUNTER MEDICATION Take 325 mg  by mouth 2 (two) times daily. NATURAL CALM (magnesium carbonate and citric acid)-IONIC MAGNESIUM CITRATE    [provider]  OVER THE COUNTER MEDICATION Equilib - 2 tabs twice daily    [provider]  Prenatal Vit w/Fe-Methylfol-FA (TL FOLATE PO) Take by mouth.    [provider]  Probiotic Product (PROBIOTIC ADVANCED PO) Take 1 capsule by mouth 2 (two) times daily. 100 BILLION    [provider]  QUERCETIN PO Take by mouth.    [provider]  THEANINE PO Take by mouth.    [provider]    Family History Family History  Problem Relation Age of Onset  . Heart disease Mother        CHF  . Hypertension Mother   . Cancer Father 6       lung cancer  . Hypertension Father   . Hypertension Paternal Grandmother   . Stroke Paternal Grandmother   . Hypertension Paternal Grandfather   . Mental retardation Son     Social History Social History   Tobacco Use  . Smoking status: Never Smoker  . Smokeless tobacco: Never Used  Substance Use Topics  . Alcohol use: Yes    Comment: occ  . Drug use: No     Allergies   Isovue [iopamidol]; Azithromycin; Ciprofloxacin; Flagyl [metronidazole]; Prednisone; Sulfa antibiotics; Sulfacetamide sodium; and Wheat bran   Review of Systems Review of Systems  Constitutional: Negative for chills, diaphoresis, fatigue and fever.  HENT: Negative for congestion.   Respiratory: Negative for cough, chest tightness, shortness of breath and wheezing.   Cardiovascular: Negative for chest pain and palpitations.  Gastrointestinal: Positive for abdominal pain, diarrhea and nausea. Negative for blood in stool, constipation and vomiting.  Genitourinary: Negative for dysuria, flank pain and frequency.  Musculoskeletal: Negative for back pain, neck pain and neck stiffness.  Skin: Negative for rash and wound.  Neurological: Negative for light-headedness and headaches.  Psychiatric/Behavioral: Negative for  agitation.  All other systems reviewed and are negative.    Physical Exam Updated Vital Signs BP (!) 158/109 (BP Location: Left Arm)   Pulse 99   Temp 98.1 F (36.7 C) (Oral)   Resp 18   Ht 5\' 7"  (1.702 m)   Wt 66.7 kg (147 lb)   LMP 03/15/2013   SpO2 100%   BMI 23.02 kg/m   Physical Exam  Constitutional: She is oriented to person, place, and time. She appears well-developed and well-nourished.  Non-toxic appearance. She does not appear ill. No distress.  HENT:  Head:  Normocephalic and atraumatic.  Right Ear: External ear normal.  Left Ear: External ear normal.  Nose: Nose normal.  Mouth/Throat: Oropharynx is clear and moist. No oropharyngeal exudate.  Eyes: Pupils are equal, round, and reactive to light. Conjunctivae and EOM are normal.  Neck: Normal range of motion. Neck supple.  Cardiovascular: Normal rate and intact distal pulses.  No murmur heard. Pulmonary/Chest: No stridor. No respiratory distress. She has no wheezes. She has no rales. She exhibits no tenderness.  Abdominal: Normal appearance and bowel sounds are normal. She exhibits no distension. There is tenderness in the epigastric area and periumbilical area. There is no rigidity, no rebound, no guarding and no CVA tenderness.  Musculoskeletal: She exhibits no tenderness.  Neurological: She is alert and oriented to person, place, and time. She has normal reflexes. She exhibits normal muscle tone. Coordination normal.  Skin: Skin is warm. No rash noted. She is not diaphoretic. No erythema.  Psychiatric: She has a normal mood and affect.  Nursing note and vitals reviewed.    ED Treatments / Results  Labs (all labs ordered are listed, but only abnormal results are displayed) Labs Reviewed  COMPREHENSIVE METABOLIC PANEL - Abnormal; Notable for the following components:      Result Value   Potassium 3.4 (*)    Total Protein 8.2 (*)    All other components within normal limits  URINALYSIS, ROUTINE W REFLEX  MICROSCOPIC - Abnormal; Notable for the following components:   Specific Gravity, Urine <1.005 (*)    Hgb urine dipstick SMALL (*)    All other components within normal limits  URINALYSIS, MICROSCOPIC (REFLEX) - Abnormal; Notable for the following components:   Bacteria, UA RARE (*)    All other components within normal limits  LIPASE, BLOOD  CBC    EKG None  Radiology Ct Abdomen Pelvis Wo Contrast  Result Date: 11/26/2017 CLINICAL DATA:  Supraumbilical pain for 2 days. Two similar episodes 2 weeks ago. Nausea and diarrhea. History of IV contrast allergy. EXAM: CT ABDOMEN AND PELVIS WITHOUT CONTRAST TECHNIQUE: Multidetector CT imaging of the abdomen and pelvis was performed following the standard protocol without IV contrast. COMPARISON:  09/30/2017 FINDINGS: Lower chest: Mild dependent changes in the lung bases. Hepatobiliary: No focal liver abnormality is seen. No gallstones, gallbladder wall thickening, or biliary dilatation. Pancreas: Unremarkable. No pancreatic ductal dilatation or surrounding inflammatory changes. Spleen: Normal in size without focal abnormality. Adrenals/Urinary Tract: Adrenal glands are unremarkable. Kidneys are normal, without renal calculi, focal lesion, or hydronephrosis. Bladder is unremarkable. Stomach/Bowel: Stomach, small bowel, and colon are not abnormally distended. Diverticulosis of the sigmoid colon without evidence of diverticulitis. Scattered stool in the colon. Appendix is normal. Vascular/Lymphatic: No significant vascular findings are present. No enlarged abdominal or pelvic lymph nodes. Reproductive: Uterus and bilateral adnexa are unremarkable. Other: No abdominal wall hernia or abnormality. No abdominopelvic ascites. Musculoskeletal: No acute or significant osseous findings. IMPRESSION: No acute process demonstrated in the abdomen or pelvis. No evidence of bowel obstruction or inflammation. Diverticulosis of the sigmoid colon without evidence of  diverticulitis. Electronically Signed   By: Lucienne Capers M.D.   On: 11/26/2017 23:29    Procedures Procedures (including critical care time)  Medications Ordered in ED Medications  gi cocktail (Maalox,Lidocaine,Donnatal) (30 mLs Oral Given 11/26/17 2239)  ondansetron (ZOFRAN) injection 4 mg (4 mg Intravenous Given 11/26/17 2239)  sodium chloride 0.9 % bolus 1,000 mL ( Intravenous Stopped 11/26/17 2306)  ketorolac (TORADOL) 30 MG/ML injection 30 mg (30 mg Intravenous Given  11/26/17 2304)     Initial Impression / Assessment and Plan / ED Course  I have reviewed the triage vital signs and the nursing notes.  Pertinent labs & imaging results that were available during my care of the patient were reviewed by me and considered in my medical decision making (see chart for details).     DUNG PRIEN is a 62 y.o. female with a past medical history significant for prior diverticulitis, 5 myalgia, anxiety, depression, Lyme disease, and recent sinus infection recently completed Augmentin who presents with severe abdominal pain, nausea, and diarrhea.  Patient reports that for the last several days she has had intermittent abdominal pain that is in the epigastrium and paraumbilical area.  She describes it as moderate to severe and associated with nausea.  She then has diarrhea.  She is unsure if there is any bleeding or diarrhea.  She reports that she has taken medication without success and relief.  She reports no fevers or chills.  She denies cough, congestion, or shortness of breath at this time.  She denies chest pain, palpitations or recent trauma.  She denies any urinary symptoms.  On exam, patient had mild tenderness in the epigastrium repair umbilicus area.  Patient had no CVA tenderness and lungs were clear.  Chest was nontender.  Given patient's history of diverticulitis and the abdominal tenderness, patient will have laboratory testing and imaging to further rule out diverticulitis.  I  suspect she has either diarrhea related to the recent Augmentin use.  At this time, do not feel patient has C. difficile however will send a test for this with her recent antibiotic use.  Patient will also be given GI cocktail, pain medicine, nausea medicine, and fluids.  Patient reports that she took antibiotics in the middle the night several days ago without food and think she may have hurt her stomach.  A gastric ulcer or duodenal ulcer also considered for etiology of symptoms.  Anticipate reassessment after work-up.  CT scan was reassuring.  Laboratory testing was reassuring.   Patient was feeling better.  Patient will take her home Zantac which she is taking in the past when she had an ulcer.  Patient will follow-up with gastroneurology.  Patient understood return precautions and follow-up instructions.  Suspect gastroenteritis in the setting of diarrhea versus ulcer problems.  Patient had no other worsens or concerns and was discharged in good condition.   Final Clinical Impressions(s) / ED Diagnoses   Final diagnoses:  Epigastric pain    ED Discharge Orders    None      Clinical Impression: 1. Epigastric pain     Disposition: Discharge  Condition: Good  I have discussed the results, Dx and Tx plan with the pt(& family if present). He/she/they expressed understanding and agree(s) with the plan. Discharge instructions discussed at great length. Strict return precautions discussed and pt &/or family have verbalized understanding of the instructions. No further questions at time of discharge.    Discharge Medication List as of 11/26/2017 11:51 PM      Follow Up: Faythe Casa, MD [address] [place]  [address] [phone]     Gatha Mayer, MD [address] [place] [address] [phone]        Dezmon Conover, Gwenyth Allegra, MD 11/27/17 [phone]

## 2017-11-26 NOTE — ED Triage Notes (Signed)
Pt is c/o abd pain right above the umbilicus  Pt states the same happened a couple weeks ago when she was taking antibiotics and then it happened yesterday after she ate lunch  Pt states she had diarrhea with the pain yesterday and a small amt this morning  Pt c/o mild nausea

## 2017-11-26 NOTE — Discharge Instructions (Addendum)
Your work-up today did not show acute pathology causing her abdominal pain and discomfort.  Based on your history and description of symptoms, ulcerations are considered.  Please follow-up with your gastroenterologist for further evaluation and management.  Please use the Zantac as we discussed at home.  If any symptoms change or worsen, please return to the nearest emergency department.  Please stay hydrated.

## 2017-12-02 DIAGNOSIS — R69 Illness, unspecified: Secondary | ICD-10-CM | POA: Diagnosis not present

## 2017-12-02 DIAGNOSIS — K253 Acute gastric ulcer without hemorrhage or perforation: Secondary | ICD-10-CM | POA: Diagnosis not present

## 2017-12-15 DIAGNOSIS — Z1211 Encounter for screening for malignant neoplasm of colon: Secondary | ICD-10-CM | POA: Diagnosis not present

## 2017-12-15 DIAGNOSIS — K219 Gastro-esophageal reflux disease without esophagitis: Secondary | ICD-10-CM | POA: Diagnosis not present

## 2017-12-15 DIAGNOSIS — R1033 Periumbilical pain: Secondary | ICD-10-CM | POA: Diagnosis not present

## 2017-12-19 DIAGNOSIS — K582 Mixed irritable bowel syndrome: Secondary | ICD-10-CM | POA: Diagnosis not present

## 2017-12-19 DIAGNOSIS — I951 Orthostatic hypotension: Secondary | ICD-10-CM | POA: Diagnosis not present

## 2017-12-19 DIAGNOSIS — B2700 Gammaherpesviral mononucleosis without complication: Secondary | ICD-10-CM | POA: Diagnosis not present

## 2017-12-19 DIAGNOSIS — K259 Gastric ulcer, unspecified as acute or chronic, without hemorrhage or perforation: Secondary | ICD-10-CM | POA: Diagnosis not present

## 2017-12-19 DIAGNOSIS — R69 Illness, unspecified: Secondary | ICD-10-CM | POA: Diagnosis not present

## 2017-12-19 DIAGNOSIS — A6920 Lyme disease, unspecified: Secondary | ICD-10-CM | POA: Diagnosis not present

## 2017-12-19 DIAGNOSIS — J329 Chronic sinusitis, unspecified: Secondary | ICD-10-CM | POA: Diagnosis not present

## 2017-12-19 DIAGNOSIS — D4709 Other mast cell neoplasms of uncertain behavior: Secondary | ICD-10-CM | POA: Diagnosis not present

## 2018-01-05 DIAGNOSIS — R000 Tachycardia, unspecified: Secondary | ICD-10-CM | POA: Diagnosis not present

## 2018-01-05 DIAGNOSIS — R69 Illness, unspecified: Secondary | ICD-10-CM | POA: Diagnosis not present

## 2018-01-05 DIAGNOSIS — Z13220 Encounter for screening for lipoid disorders: Secondary | ICD-10-CM | POA: Diagnosis not present

## 2018-01-05 DIAGNOSIS — D8940 Mast cell activation, unspecified: Secondary | ICD-10-CM | POA: Diagnosis not present

## 2018-01-05 DIAGNOSIS — Z8619 Personal history of other infectious and parasitic diseases: Secondary | ICD-10-CM | POA: Diagnosis not present

## 2018-01-05 DIAGNOSIS — Z7689 Persons encountering health services in other specified circumstances: Secondary | ICD-10-CM | POA: Diagnosis not present

## 2018-01-06 DIAGNOSIS — Z13220 Encounter for screening for lipoid disorders: Secondary | ICD-10-CM | POA: Diagnosis not present

## 2018-01-06 DIAGNOSIS — Z0000 Encounter for general adult medical examination without abnormal findings: Secondary | ICD-10-CM | POA: Diagnosis not present

## 2018-01-19 ENCOUNTER — Encounter (HOSPITAL_BASED_OUTPATIENT_CLINIC_OR_DEPARTMENT_OTHER): Payer: Self-pay

## 2018-01-19 ENCOUNTER — Other Ambulatory Visit: Payer: Self-pay

## 2018-01-19 ENCOUNTER — Emergency Department (HOSPITAL_BASED_OUTPATIENT_CLINIC_OR_DEPARTMENT_OTHER)
Admission: EM | Admit: 2018-01-19 | Discharge: 2018-01-19 | Disposition: A | Discharge status: Home / Self Care | Payer: Medicare HMO | Attending: Emergency Medicine | Admitting: Emergency Medicine

## 2018-01-19 DIAGNOSIS — I10 Essential (primary) hypertension: Secondary | ICD-10-CM | POA: Diagnosis not present

## 2018-01-19 DIAGNOSIS — A6920 Lyme disease, unspecified: Secondary | ICD-10-CM | POA: Diagnosis not present

## 2018-01-19 DIAGNOSIS — Z79899 Other long term (current) drug therapy: Secondary | ICD-10-CM | POA: Insufficient documentation

## 2018-01-19 DIAGNOSIS — R531 Weakness: Secondary | ICD-10-CM | POA: Diagnosis not present

## 2018-01-19 DIAGNOSIS — E860 Dehydration: Secondary | ICD-10-CM | POA: Diagnosis not present

## 2018-01-19 LAB — BASIC METABOLIC PANEL
ANION GAP: 8 (ref 5–15)
BUN: 10 mg/dL (ref 8–23)
CALCIUM: 9.1 mg/dL (ref 8.9–10.3)
CO2: 31 mmol/L (ref 22–32)
Chloride: 100 mmol/L (ref 98–111)
Creatinine, Ser: 0.61 mg/dL (ref 0.44–1.00)
GFR calc Af Amer: >60 mL/min (ref >60)
GFR calc non Af Amer: >60 mL/min (ref >60)
GLUCOSE: 75 mg/dL (ref 70–99)
Potassium: 3.7 mmol/L (ref 3.5–5.1)
Sodium: 139 mmol/L (ref 135–145)

## 2018-01-19 LAB — URINALYSIS, ROUTINE W REFLEX MICROSCOPIC
BILIRUBIN URINE: NEGATIVE
GLUCOSE, UA: NEGATIVE mg/dL
HGB URINE DIPSTICK: NEGATIVE
Ketones, ur: NEGATIVE mg/dL
Leukocytes, UA: NEGATIVE
Nitrite: NEGATIVE
PH: 8.5 — AB (ref 5.0–8.0)
Protein, ur: NEGATIVE mg/dL
SPECIFIC GRAVITY, URINE: 1.01 (ref 1.005–1.030)

## 2018-01-19 LAB — CBC
HEMATOCRIT: 40.6 % (ref 36.0–46.0)
Hemoglobin: 13.8 g/dL (ref 12.0–15.0)
MCH: 32.3 pg (ref 26.0–34.0)
MCHC: 34 g/dL (ref 30.0–36.0)
MCV: 95.1 fL (ref 78.0–100.0)
Platelets: 280 10*3/uL (ref 150–400)
RBC: 4.27 MIL/uL (ref 3.87–5.11)
RDW: 13.2 % (ref 11.5–15.5)
WBC: 5.2 10*3/uL (ref 4.0–10.5)

## 2018-01-19 MED ORDER — SODIUM CHLORIDE 0.9 % IV BOLUS
1000.0000 mL | Freq: Once | INTRAVENOUS | Status: AC
  Administered 2018-01-19: 1000 mL via INTRAVENOUS

## 2018-01-19 NOTE — ED Triage Notes (Signed)
C/o weakness x today-states she woke feeling "wiped out"-feels is similar to when she has "electrolyte imbalance"-NAD-steady gait

## 2018-01-19 NOTE — ED Notes (Signed)
Patient refused EKG.

## 2018-01-19 NOTE — ED Provider Notes (Signed)
[facility] Provider Note   CSN: [id number] Arrival date & time: 01/19/18  2010     History   Chief Complaint Chief Complaint  Patient presents with  . Weakness    HPI Katie [name] is a 62 y.o. female.  HPI Pt states she got weak and faitgued today.  She thinks it may be related to over exertion in the heat this weekend.  She slept most of the day and tried to drink fluids but it did not help.  No fevers.  No cough.  No chest pain or shortness of breath.  In the past these symptoms were related to her sodium level being low so she wanted to get it checked out.   Past Medical History:  Diagnosis Date  . Anxiety   . Diverticulitis   . Fibromyalgia   . Hypertension   . Lyme disease   . Mast cell activation syndrome ([place])   . Osteopenia 05/2017   T score -2.2 FRAX 11% / 1.7%  . POTS (postural orthostatic tachycardia syndrome)     Patient Active Problem List   Diagnosis Date Noted  . Diarrhea 11/14/2015  . Acute gastroenteritis 11/14/2015  . Dehydration   . Enteritis 11/13/2015  . Hyponatremia 11/13/2015  . Elevated blood pressure reading 07/14/2013  . Lyme disease 10/25/2012  . Chronic fatigue syndrome 10/25/2012  . Anxiety and depression 10/25/2012  . Fibromyalgia 10/25/2012  . Hormone replacement therapy - followed by Dr. Lonia Skinner in gyn and by [place] 10/25/2012    Past Surgical History:  Procedure Laterality Date  . Cataract Removal With Lens Implant Placement Left 03-06-2015  . CERVICAL BIOPSY  W/ LOOP ELECTRODE EXCISION  04/19/2004   HGSIL CIN II  MARGINS NEGATIVE  . COLPOSCOPY  02/19/2004   HGSIL CIN II     OB History    Gravida  2   Para  2   Term      Preterm      AB      Living  2     SAB      TAB      Ectopic      Multiple      Live Births               Home Medications    Prior to Admission medications   Medication Sig Start Date End Date Taking? Authorizing  Provider  ALPRAZolam Duanne Moron) 0.5 MG tablet Take 1 tablet (0.5 mg total) by mouth 2 (two) times daily as needed for anxiety. 04/10/17   Burchette, Alinda Sierras, MD  cetirizine (ZYRTEC ALLERGY) 10 MG tablet Take 10 mg by mouth daily.    [provider]  Cholecalciferol (VITAMIN D-3) 5000 UNITS TABS Take 5,000 tablets by mouth daily.    [provider]  Coenzyme Q10 (CO Q10 PO) Take 400 mg by mouth daily.    [provider]  dicyclomine (BENTYL) 20 MG tablet Take 1 tablet (20 mg total) by mouth 3 (three) times daily before meals. 09/30/17   Long, Wonda Olds, MD  ibuprofen (ADVIL,MOTRIN) 800 MG tablet Take 1 tablet (800 mg total) by mouth every 8 (eight) hours as needed. 09/30/17   Long, Wonda Olds, MD  Ketotifen Fumarate POWD Take 0.25 mg by mouth.     [provider]  loperamide (IMODIUM) 2 MG capsule Take 1 capsule (2 mg total) by mouth 4 (four) times daily as needed for diarrhea or loose stools. 09/30/17  Long, Wonda Olds, MD  METHYLCOBALAMIN PO Take by mouth.    [provider]  montelukast (SINGULAIR) 10 MG tablet Take 10 mg by mouth at bedtime.    [provider]  Nutritional Supplements (RESTORE-X PO) Take by mouth.    [provider]  Omega-3 Fatty Acids (FISH OIL PO) Take 2 capsules by mouth 2 (two) times daily. BARLEANS FISH OIL    [provider]  ondansetron (ZOFRAN ODT) 4 MG disintegrating tablet Take 1 tablet (4 mg total) by mouth every 8 (eight) hours as needed for nausea or vomiting. 09/30/17   Long, Wonda Olds, MD  OVER THE COUNTER MEDICATION Take 2 capsules by mouth daily. S-Acetyl Glutathione (SYNERGY)    [provider]  OVER THE COUNTER MEDICATION Take 325 mg by mouth 2 (two) times daily. NATURAL CALM (magnesium carbonate and citric acid)-IONIC MAGNESIUM CITRATE    [provider]  OVER THE COUNTER MEDICATION Equilib - 2 tabs twice daily    [provider]  Prenatal Vit w/Fe-Methylfol-FA (TL FOLATE  PO) Take by mouth.    [provider]  Probiotic Product (PROBIOTIC ADVANCED PO) Take 1 capsule by mouth 2 (two) times daily. 100 BILLION    [provider]  QUERCETIN PO Take by mouth.    [provider]  THEANINE PO Take by mouth.    [provider]    Family History Family History  Problem Relation Age of Onset  . Heart disease Mother        CHF  . Hypertension Mother   . Cancer Father 73       lung cancer  . Hypertension Father   . Hypertension Paternal Grandmother   . Stroke Paternal Grandmother   . Hypertension Paternal Grandfather   . Mental retardation Son     Social History Social History   Tobacco Use  . Smoking status: Never Smoker  . Smokeless tobacco: Never Used  Substance Use Topics  . Alcohol use: Yes    Comment: occ  . Drug use: No     Allergies   Isovue [iopamidol]; Azithromycin; Ciprofloxacin; Flagyl [metronidazole]; Prednisone; Sulfa antibiotics; Sulfacetamide sodium; and Wheat bran   Review of Systems Review of Systems  All other systems reviewed and are negative.    Physical Exam Updated Vital Signs BP (!) 163/101 (BP Location: Left Arm)   Pulse 96   Temp 98.2 F (36.8 C) (Oral)   Resp 20   Ht 1.626 m (5\' 4" )   Wt 66.7 kg   LMP 03/15/2013   SpO2 100%   BMI 25.24 kg/m   Physical Exam  Constitutional: She appears well-developed and well-nourished. No distress.  HENT:  Head: Normocephalic and atraumatic.  Right Ear: External ear normal.  Left Ear: External ear normal.  Eyes: Conjunctivae are normal. Right eye exhibits no discharge. Left eye exhibits no discharge. No scleral icterus.  Neck: Neck supple. No tracheal deviation present.  Cardiovascular: Normal rate, regular rhythm and intact distal pulses.  Pulmonary/Chest: Effort normal and breath sounds normal. No stridor. No respiratory distress. She has no wheezes. She has no rales.  Abdominal: Soft. Bowel sounds are normal. She exhibits no  distension. There is no tenderness. There is no rebound and no guarding.  Musculoskeletal: She exhibits no edema or tenderness.  Neurological: She is alert. She has normal strength. No cranial nerve deficit (no facial droop, extraocular movements intact, no slurred speech) or sensory deficit. She exhibits normal muscle tone. She displays no seizure activity.  Coordination normal.  Skin: Skin is warm and dry. No rash noted.  Psychiatric: She has a normal mood and affect.  Nursing note and vitals reviewed.    ED Treatments / Results  Labs (all labs ordered are listed, but only abnormal results are displayed) Labs Reviewed  URINALYSIS, ROUTINE W REFLEX MICROSCOPIC - Abnormal; Notable for the following components:      Result Value   pH 8.5 (*)    All other components within normal limits  CBC  BASIC METABOLIC PANEL     Procedures Procedures (including critical care time)  Medications Ordered in ED Medications  sodium chloride 0.9 % bolus 1,000 mL ( Intravenous Rate/Dose Verify 01/19/18 2221)     Initial Impression / Assessment and Plan / ED Course  I have reviewed the triage vital signs and the nursing notes.  Pertinent labs & imaging results that were available during my care of the patient were reviewed by me and considered in my medical decision making (see chart for details).  Clinical Course as of Jan 20 2220  Tue Jan 19, 2018  2131 EKG refused by patient   [JK]    Clinical Course User Index [JK] Dorie Rank, MD    Patient presented to the ED for evaluation of weakness that she felt was related to an electrolyte imbalance.  Patient states she has a history of hyponatremia.  Patient states because of her other medical conditions she has been told when her sodium levels are  just borderline she will likely have symptoms.  Patient hydrated at home with electrolytes fluids prior to arrival.  Patient's laboratory tests are normal.  She is not anemic.  She does not have any  electrolyte abnormalities.  I wanted to do an EKG but the patient did not feel that was necessary.  She is afebrile.  She does not appear to be in any distress.  At this time there does not appear to be any evidence of an acute emergency medical condition and the patient appears stable for discharge with appropriate outpatient follow up.   Final Clinical Impressions(s) / ED Diagnoses   Final diagnoses:  Dehydration    ED Discharge Orders    None       Dorie Rank, MD 01/19/18 2221

## 2018-01-19 NOTE — Discharge Instructions (Addendum)
Continue to stay well-hydrated, follow-up with your primary care doctor later this week if the symptoms persist

## 2018-03-02 DIAGNOSIS — J3489 Other specified disorders of nose and nasal sinuses: Secondary | ICD-10-CM | POA: Diagnosis not present

## 2018-03-02 DIAGNOSIS — R69 Illness, unspecified: Secondary | ICD-10-CM | POA: Diagnosis not present

## 2018-04-06 DIAGNOSIS — E559 Vitamin D deficiency, unspecified: Secondary | ICD-10-CM | POA: Diagnosis not present

## 2018-04-06 DIAGNOSIS — R69 Illness, unspecified: Secondary | ICD-10-CM | POA: Diagnosis not present

## 2018-04-06 DIAGNOSIS — E878 Other disorders of electrolyte and fluid balance, not elsewhere classified: Secondary | ICD-10-CM | POA: Diagnosis not present

## 2018-04-14 ENCOUNTER — Encounter (INDEPENDENT_AMBULATORY_CARE_PROVIDER_SITE_OTHER): Payer: Medicare HMO | Admitting: Ophthalmology

## 2018-04-30 DIAGNOSIS — A6920 Lyme disease, unspecified: Secondary | ICD-10-CM | POA: Diagnosis not present

## 2018-04-30 DIAGNOSIS — K259 Gastric ulcer, unspecified as acute or chronic, without hemorrhage or perforation: Secondary | ICD-10-CM | POA: Diagnosis not present

## 2018-04-30 DIAGNOSIS — B9681 Helicobacter pylori [H. pylori] as the cause of diseases classified elsewhere: Secondary | ICD-10-CM | POA: Diagnosis not present

## 2018-04-30 DIAGNOSIS — D4709 Other mast cell neoplasms of uncertain behavior: Secondary | ICD-10-CM | POA: Diagnosis not present

## 2018-05-11 ENCOUNTER — Ambulatory Visit: Payer: Medicare HMO | Admitting: Obstetrics & Gynecology

## 2018-05-11 ENCOUNTER — Encounter: Payer: Self-pay | Admitting: Obstetrics & Gynecology

## 2018-05-11 VITALS — BP 112/70 | Ht 67.0 in | Wt 151.0 lb

## 2018-05-11 DIAGNOSIS — Z630 Problems in relationship with spouse or partner: Secondary | ICD-10-CM | POA: Diagnosis not present

## 2018-05-11 DIAGNOSIS — M8589 Other specified disorders of bone density and structure, multiple sites: Secondary | ICD-10-CM

## 2018-05-11 DIAGNOSIS — Z9189 Other specified personal risk factors, not elsewhere classified: Secondary | ICD-10-CM | POA: Diagnosis not present

## 2018-05-11 DIAGNOSIS — Z01419 Encounter for gynecological examination (general) (routine) without abnormal findings: Secondary | ICD-10-CM

## 2018-05-11 DIAGNOSIS — R69 Illness, unspecified: Secondary | ICD-10-CM | POA: Diagnosis not present

## 2018-05-11 DIAGNOSIS — Z780 Asymptomatic menopausal state: Secondary | ICD-10-CM | POA: Diagnosis not present

## 2018-05-11 NOTE — Progress Notes (Signed)
Katie [name] 02-Nov-1955 [id number]   History:    63 y.o. G2P2L2 Married x 4 yrs.  Son with Autism.  RP:  Established patient presenting for annual gyn exam   HPI: Menopause, well on no HRT.  No PMB.  No pelvic pain.  Not sexually active x husband's Dx of Bladder Cancer.  Marital discord.  Not helped previously by psychotherapy for husband.  Patient seeing a therapist.  Not improved with antidepressants prefers not to take medication.  No suicidal ideation.  Breasts normal.  Urine/BMs wnl.  BMI 23.65.  Health labs with Fam MD.    Past medical history,surgical history, family history and social history were all reviewed and documented in the EPIC chart.  Gynecologic History Patient's last menstrual period was 03/15/2013. Contraception: post menopausal status Last Pap: 2017. Results were: Negative Last mammogram: Thermography stable per patient. Bone Density: 05/2017 Osteopenia Colonoscopy: Cologard 2019  Obstetric History OB History  Gravida Para Term Preterm AB Living  2 2       2   SAB TAB Ectopic Multiple Live Births               # Outcome Date GA Lbr Len/2nd Weight Sex Delivery Anes PTL Lv  2 Para           1 Para              ROS: A ROS was performed and pertinent positives and negatives are included in the history.  GENERAL: No fevers or chills. HEENT: No change in vision, no earache, sore throat or sinus congestion. NECK: No pain or stiffness. CARDIOVASCULAR: No chest pain or pressure. No palpitations. PULMONARY: No shortness of breath, cough or wheeze. GASTROINTESTINAL: No abdominal pain, nausea, vomiting or diarrhea, melena or bright red blood per rectum. GENITOURINARY: No urinary frequency, urgency, hesitancy or dysuria. MUSCULOSKELETAL: No joint or muscle pain, no back pain, no recent trauma. DERMATOLOGIC: No rash, no itching, no lesions. ENDOCRINE: No polyuria, polydipsia, no heat or cold intolerance. No recent change in weight. HEMATOLOGICAL: No anemia or easy bruising  or bleeding. NEUROLOGIC: No headache, seizures, numbness, tingling or weakness. PSYCHIATRIC: No depression, no loss of interest in normal activity or change in sleep pattern.     Exam:   BP 112/70   Ht 5\' 7"  (1.702 m)   Wt 151 lb (68.5 kg)   LMP 03/15/2013   BMI 23.65 kg/m   Body mass index is 23.65 kg/m.  General appearance : Well developed well nourished female. No acute distress HEENT: Eyes: no retinal hemorrhage or exudates,  Neck supple, trachea midline, no carotid bruits, no thyroidmegaly Lungs: Clear to auscultation, no rhonchi or wheezes, or rib retractions  Heart: Regular rate and rhythm, no murmurs or gallops Breast:Examined in sitting and supine position were symmetrical in appearance, no palpable masses or tenderness,  no skin retraction, no nipple inversion, no nipple discharge, no skin discoloration, no axillary or supraclavicular lymphadenopathy Abdomen: no palpable masses or tenderness, no rebound or guarding Extremities: no edema or skin discoloration or tenderness  Pelvic: Vulva: Normal             Vagina: No gross lesions or discharge  Cervix: No gross lesions or discharge.  Pap reflex done.  Uterus  AV, normal size, shape and consistency, non-tender and mobile  Adnexa  Without masses or tenderness  Anus: Normal   Assessment/Plan:  62 y.o. female for annual exam   1. Encounter for routine gynecological examination with Papanicolaou smear of  cervix Normal gynecologic exam in menopause.  Pap reflex done.  Breast exam normal.  Thermography stable per patient.  Will obtain report.  Declines mammography as long as thermography is stable.  Health labs with family physician.  Cologuard done in 2019.  Good body mass index at 23.65.  Healthy nutrition and regular physical activities recommended.  2. Postmenopausal Well on no hormone replacement therapy.  No postmenopausal bleeding.  3. Osteopenia of multiple sites Osteopenia on bone density January 2019.  Vitamin D  supplements, calcium intake of 1.2 to 1.5 g/day and regular weightbearing physical activity recommended.  We will repeat a bone density in January 2021.  4. Marital conflict Will continue with psychotherapy.  Situational depressive symptoms.  No suicidal ideations.  Attempted antidepressant without improvement previously.  Recommend regular physical activity.  Other orders - pantoprazole (PROTONIX) 40 MG tablet; Take 40 mg by mouth daily. - UNABLE TO FIND; biestro- care 2 pumps daily it varies progestacare - 3 pumps daily it varies  Princess Bruins MD, 2:11 PM 05/11/2018

## 2018-05-12 LAB — PAP IG W/ RFLX HPV ASCU

## 2018-05-13 ENCOUNTER — Encounter (INDEPENDENT_AMBULATORY_CARE_PROVIDER_SITE_OTHER): Payer: Medicare HMO | Admitting: Ophthalmology

## 2018-05-14 ENCOUNTER — Encounter: Payer: Self-pay | Admitting: *Deleted

## 2018-05-14 ENCOUNTER — Encounter: Payer: Self-pay | Admitting: Obstetrics & Gynecology

## 2018-05-14 NOTE — Patient Instructions (Addendum)
1. Encounter for routine gynecological examination with Papanicolaou smear of cervix Normal gynecologic exam in menopause.  Pap reflex done.  Breast exam normal.  Thermography stable per patient.  Will obtain report.  Declines mammography as long as thermography is stable.  Health labs with family physician.  Cologuard done in 2019.  Good body mass index at 23.65.  Healthy nutrition and regular physical activities recommended.  2. Postmenopausal Well on no hormone replacement therapy.  No postmenopausal bleeding.  3. Osteopenia of multiple sites Osteopenia on bone density January 2019.  Vitamin D supplements, calcium intake of 1.2 to 1.5 g/day and regular weightbearing physical activity recommended.  We will repeat a bone density in January 2021.  4. Marital conflict Will continue with psychotherapy.  Situational depressive symptoms.  No suicidal ideations.  Attempted antidepressant without improvement previously.  Recommend regular physical activity.  Other orders - pantoprazole (PROTONIX) 40 MG tablet; Take 40 mg by mouth daily. - UNABLE TO FIND; biestro- care 2 pumps daily it varies progestacare - 3 pumps daily it varies  Katie [name], it was a pleasure seeing you today!  I will inform you of your results as soon as they are available.!

## 2018-06-01 DIAGNOSIS — D8940 Mast cell activation, unspecified: Secondary | ICD-10-CM | POA: Diagnosis not present

## 2018-06-01 DIAGNOSIS — F325 Major depressive disorder, single episode, in full remission: Secondary | ICD-10-CM | POA: Diagnosis not present

## 2018-06-01 DIAGNOSIS — R69 Illness, unspecified: Secondary | ICD-10-CM | POA: Diagnosis not present

## 2018-06-02 DIAGNOSIS — H1789 Other corneal scars and opacities: Secondary | ICD-10-CM | POA: Diagnosis not present

## 2018-06-02 DIAGNOSIS — Z961 Presence of intraocular lens: Secondary | ICD-10-CM | POA: Diagnosis not present

## 2018-06-02 DIAGNOSIS — H11153 Pinguecula, bilateral: Secondary | ICD-10-CM | POA: Diagnosis not present

## 2018-06-04 ENCOUNTER — Encounter (INDEPENDENT_AMBULATORY_CARE_PROVIDER_SITE_OTHER): Payer: Medicare HMO | Admitting: Ophthalmology

## 2018-07-29 ENCOUNTER — Encounter (INDEPENDENT_AMBULATORY_CARE_PROVIDER_SITE_OTHER): Payer: Medicare HMO | Admitting: Ophthalmology

## 2018-08-20 DIAGNOSIS — J019 Acute sinusitis, unspecified: Secondary | ICD-10-CM | POA: Diagnosis not present

## 2018-09-30 DIAGNOSIS — J0191 Acute recurrent sinusitis, unspecified: Secondary | ICD-10-CM | POA: Diagnosis not present

## 2018-10-05 DIAGNOSIS — H26492 Other secondary cataract, left eye: Secondary | ICD-10-CM | POA: Diagnosis not present

## 2018-10-05 DIAGNOSIS — H26491 Other secondary cataract, right eye: Secondary | ICD-10-CM | POA: Diagnosis not present

## 2018-10-13 DIAGNOSIS — Z733 Stress, not elsewhere classified: Secondary | ICD-10-CM | POA: Diagnosis not present

## 2018-10-13 DIAGNOSIS — I951 Orthostatic hypotension: Secondary | ICD-10-CM | POA: Diagnosis not present

## 2018-10-13 DIAGNOSIS — I10 Essential (primary) hypertension: Secondary | ICD-10-CM | POA: Diagnosis not present

## 2018-10-13 DIAGNOSIS — J329 Chronic sinusitis, unspecified: Secondary | ICD-10-CM | POA: Diagnosis not present

## 2018-10-13 DIAGNOSIS — R69 Illness, unspecified: Secondary | ICD-10-CM | POA: Diagnosis not present

## 2018-10-13 DIAGNOSIS — D8940 Mast cell activation, unspecified: Secondary | ICD-10-CM | POA: Diagnosis not present

## 2018-10-28 ENCOUNTER — Encounter (HOSPITAL_BASED_OUTPATIENT_CLINIC_OR_DEPARTMENT_OTHER): Payer: Self-pay | Admitting: *Deleted

## 2018-10-28 ENCOUNTER — Emergency Department (HOSPITAL_BASED_OUTPATIENT_CLINIC_OR_DEPARTMENT_OTHER)
Admission: EM | Admit: 2018-10-28 | Discharge: 2018-10-28 | Disposition: A | Discharge status: Home / Self Care | Payer: Medicare HMO | Attending: Emergency Medicine | Admitting: Emergency Medicine

## 2018-10-28 ENCOUNTER — Other Ambulatory Visit: Payer: Self-pay

## 2018-10-28 ENCOUNTER — Emergency Department (HOSPITAL_BASED_OUTPATIENT_CLINIC_OR_DEPARTMENT_OTHER): Payer: Medicare HMO

## 2018-10-28 DIAGNOSIS — Z79899 Other long term (current) drug therapy: Secondary | ICD-10-CM | POA: Insufficient documentation

## 2018-10-28 DIAGNOSIS — R531 Weakness: Secondary | ICD-10-CM | POA: Diagnosis not present

## 2018-10-28 DIAGNOSIS — I10 Essential (primary) hypertension: Secondary | ICD-10-CM | POA: Insufficient documentation

## 2018-10-28 DIAGNOSIS — E860 Dehydration: Secondary | ICD-10-CM | POA: Insufficient documentation

## 2018-10-28 LAB — BASIC METABOLIC PANEL
Anion gap: 10 (ref 5–15)
BUN: 8 mg/dL (ref 8–23)
CO2: 24 mmol/L (ref 22–32)
Calcium: 8.9 mg/dL (ref 8.9–10.3)
Chloride: 98 mmol/L (ref 98–111)
Creatinine, Ser: 0.7 mg/dL (ref 0.44–1.00)
GFR calc Af Amer: >60 mL/min (ref >60)
GFR calc non Af Amer: >60 mL/min (ref >60)
Glucose, Bld: 98 mg/dL (ref 70–99)
Potassium: 3.3 mmol/L — ABNORMAL LOW (ref 3.5–5.1)
Sodium: 132 mmol/L — ABNORMAL LOW (ref 135–145)

## 2018-10-28 LAB — URINALYSIS, ROUTINE W REFLEX MICROSCOPIC
Bilirubin Urine: NEGATIVE
Glucose, UA: NEGATIVE mg/dL
Ketones, ur: NEGATIVE mg/dL
Leukocytes,Ua: NEGATIVE
Nitrite: NEGATIVE
Protein, ur: NEGATIVE mg/dL
Specific Gravity, Urine: <1.005 — ABNORMAL LOW (ref 1.005–1.030)
pH: 6.5 (ref 5.0–8.0)

## 2018-10-28 LAB — CBC WITH DIFFERENTIAL/PLATELET
Abs Immature Granulocytes: 0.02 10*3/uL (ref 0.00–0.07)
Basophils Absolute: 0 10*3/uL (ref 0.0–0.1)
Basophils Relative: 1 %
Eosinophils Absolute: 0.2 10*3/uL (ref 0.0–0.5)
Eosinophils Relative: 3 %
HCT: 42.1 % (ref 36.0–46.0)
Hemoglobin: 13.7 g/dL (ref 12.0–15.0)
Immature Granulocytes: 0 %
Lymphocytes Relative: 29 %
Lymphs Abs: 2.1 10*3/uL (ref 0.7–4.0)
MCH: 32.1 pg (ref 26.0–34.0)
MCHC: 32.5 g/dL (ref 30.0–36.0)
MCV: 98.6 fL (ref 80.0–100.0)
Monocytes Absolute: 0.6 10*3/uL (ref 0.1–1.0)
Monocytes Relative: 9 %
Neutro Abs: 4.2 10*3/uL (ref 1.7–7.7)
Neutrophils Relative %: 58 %
Platelets: 257 10*3/uL (ref 150–400)
RBC: 4.27 MIL/uL (ref 3.87–5.11)
RDW: 12.2 % (ref 11.5–15.5)
WBC: 7.2 10*3/uL (ref 4.0–10.5)
nRBC: 0 % (ref 0.0–0.2)

## 2018-10-28 LAB — URINALYSIS, MICROSCOPIC (REFLEX): WBC, UA: NONE SEEN WBC/hpf (ref 0–5)

## 2018-10-28 LAB — MAGNESIUM: Magnesium: 2 mg/dL (ref 1.7–2.4)

## 2018-10-28 MED ORDER — POTASSIUM CHLORIDE CRYS ER 20 MEQ PO TBCR
40.0000 meq | EXTENDED_RELEASE_TABLET | Freq: Once | ORAL | Status: AC
  Administered 2018-10-28: 40 meq via ORAL
  Filled 2018-10-28: qty 2

## 2018-10-28 MED ORDER — SODIUM CHLORIDE 0.9 % IV BOLUS
1000.0000 mL | Freq: Once | INTRAVENOUS | Status: AC
  Administered 2018-10-28: 1000 mL via INTRAVENOUS

## 2018-10-28 NOTE — ED Notes (Signed)
ED Provider at bedside. 

## 2018-10-28 NOTE — ED Provider Notes (Signed)
[facility] Provider Note   CSN: [id number] Arrival date & time: 10/28/18  1816    History   Chief Complaint Chief Complaint  Patient presents with  . Weakness    HPI Katie [name] is a 63 y.o. female.     The history is provided by the patient.  Weakness Severity:  Mild Onset quality:  Gradual Timing:  Intermittent Progression:  Waxing and waning Chronicity:  Recurrent Context: stress   Relieved by:  Nothing Worsened by:  Nothing Associated symptoms: no abdominal pain, no arthralgias, no chest pain, no cough, no dysuria, no fever, no near-syncope, no seizures, no shortness of breath and no vomiting     Past Medical History:  Diagnosis Date  . Anxiety   . Diverticulitis   . Fibromyalgia   . Hypertension   . Lyme disease   . Mast cell activation syndrome ([place])   . Osteopenia 05/2017   T score -2.2 FRAX 11% / 1.7%  . POTS (postural orthostatic tachycardia syndrome)     Patient Active Problem List   Diagnosis Date Noted  . Diarrhea 11/14/2015  . Acute gastroenteritis 11/14/2015  . Dehydration   . Enteritis 11/13/2015  . Hyponatremia 11/13/2015  . Elevated blood pressure reading 07/14/2013  . Lyme disease 10/25/2012  . Chronic fatigue syndrome 10/25/2012  . Anxiety and depression 10/25/2012  . Fibromyalgia 10/25/2012  . Hormone replacement therapy - followed by Dr. Lonia Skinner in gyn and by [facility] 10/25/2012    Past Surgical History:  Procedure Laterality Date  . Cataract Removal With Lens Implant Placement Left 03-06-2015  . CERVICAL BIOPSY  W/ LOOP ELECTRODE EXCISION  04/19/2004   HGSIL CIN II  MARGINS NEGATIVE  . COLPOSCOPY  02/19/2004   HGSIL CIN II     OB History    Gravida  2   Para  2   Term      Preterm      AB      Living  2     SAB      TAB      Ectopic      Multiple      Live Births               Home Medications    Prior to Admission medications    Medication Sig Start Date End Date Taking? Authorizing Provider  cetirizine (ZYRTEC ALLERGY) 10 MG tablet Take 10 mg by mouth daily.    [provider]  Cholecalciferol (VITAMIN D-3) 5000 UNITS TABS Take 5,000 tablets by mouth daily.    [provider]  Coenzyme Q10 (CO Q10 PO) Take 400 mg by mouth daily.    [provider]  Ketotifen Fumarate POWD Take 0.25 mg by mouth.     [provider]  METHYLCOBALAMIN PO Take by mouth.    [provider]  montelukast (SINGULAIR) 10 MG tablet Take 10 mg by mouth at bedtime.    [provider]  Omega-3 Fatty Acids (FISH OIL PO) Take 2 capsules by mouth 2 (two) times daily. BARLEANS FISH OIL    [provider]  pantoprazole (PROTONIX) 40 MG tablet Take 40 mg by mouth daily.    [provider]  Probiotic Product (PROBIOTIC ADVANCED PO) Take 1 capsule by mouth 2 (two) times daily. 100 BILLION    [provider]  QUERCETIN PO Take by mouth.    [provider]  UNABLE TO FIND biestro- care 2  pumps daily it varies progestacare - 3 pumps daily it varies    [provider]    Family History Family History  Problem Relation Age of Onset  . Heart disease Mother        CHF  . Hypertension Mother   . Cancer Father 41       lung cancer  . Hypertension Father   . Hypertension Paternal Grandmother   . Stroke Paternal Grandmother   . Hypertension Paternal Grandfather   . Mental retardation Son     Social History Social History   Tobacco Use  . Smoking status: Never Smoker  . Smokeless tobacco: Never Used  Substance Use Topics  . Alcohol use: Yes    Comment: occ  . Drug use: No     Allergies   Isovue [iopamidol], Azithromycin, Ciprofloxacin, Flagyl [metronidazole], Prednisone, Sulfa antibiotics, Sulfacetamide sodium, and Wheat bran   Review of Systems Review of Systems  Constitutional: Negative for chills and fever.  HENT: Negative for ear pain and  sore throat.   Eyes: Negative for pain and visual disturbance.  Respiratory: Negative for cough and shortness of breath.   Cardiovascular: Negative for chest pain, palpitations and near-syncope.  Gastrointestinal: Negative for abdominal pain and vomiting.  Genitourinary: Negative for dysuria and hematuria.  Musculoskeletal: Negative for arthralgias and back pain.  Skin: Negative for color change and rash.  Neurological: Positive for weakness. Negative for seizures and syncope.  All other systems reviewed and are negative.    Physical Exam Updated Vital Signs BP (!) 173/98   Pulse 100   Temp 98.2 F (36.8 C) (Oral)   Resp 16   Ht 5\' 7"  (1.702 m)   Wt 72.6 kg   LMP 03/15/2013   SpO2 100%   BMI 25.06 kg/m   Physical Exam Vitals signs and nursing note reviewed.  Constitutional:      General: She is not in acute distress.    Appearance: She is well-developed.  HENT:     Head: Normocephalic and atraumatic.     Nose: Nose normal.     Mouth/Throat:     Mouth: Mucous membranes are moist.  Eyes:     Extraocular Movements: Extraocular movements intact.     Conjunctiva/sclera: Conjunctivae normal.     Pupils: Pupils are equal, round, and reactive to light.  Neck:     Musculoskeletal: Neck supple.  Cardiovascular:     Rate and Rhythm: Normal rate and regular rhythm.     Pulses: Normal pulses.     Heart sounds: Normal heart sounds. No murmur.  Pulmonary:     Effort: Pulmonary effort is normal. No respiratory distress.     Breath sounds: Normal breath sounds.  Abdominal:     Palpations: Abdomen is soft.     Tenderness: There is no abdominal tenderness.  Skin:    General: Skin is warm and dry.  Neurological:     General: No focal deficit present.     Mental Status: She is alert and oriented to person, place, and time.     Cranial Nerves: No cranial nerve deficit.     Sensory: No sensory deficit.     Motor: No weakness.     Coordination: Coordination normal.     Gait:  Gait normal.      ED Treatments / Results  Labs (all labs ordered are listed, but only abnormal results are displayed) Labs Reviewed  BASIC METABOLIC PANEL - Abnormal; Notable for the following components:  Result Value   Sodium 132 (*)    Potassium 3.3 (*)    All other components within normal limits  URINALYSIS, ROUTINE W REFLEX MICROSCOPIC - Abnormal; Notable for the following components:   Specific Gravity, Urine <1.005 (*)    Hgb urine dipstick TRACE (*)    All other components within normal limits  URINALYSIS, MICROSCOPIC (REFLEX) - Abnormal; Notable for the following components:   Bacteria, UA RARE (*)    All other components within normal limits  CBC WITH DIFFERENTIAL/PLATELET  MAGNESIUM    EKG None  Radiology Dg Chest 2 View  Result Date: 10/28/2018 CLINICAL DATA:  Initial evaluation for acute weakness. EXAM: CHEST - 2 VIEW COMPARISON:  Prior radiograph from 10/06/2016. FINDINGS: The cardiac and mediastinal silhouettes are stable in size and contour, and remain within normal limits. The lungs are normally inflated. Mild perihilar vascular congestion without pulmonary edema. No pleural effusion. No focal infiltrates. No pneumothorax. No acute osseous finding. No acute osseous abnormality identified. IMPRESSION: No active cardiopulmonary disease. Electronically Signed   By: Jeannine Boga M.D.   On: 10/28/2018 19:35    Procedures Procedures (including critical care time)  Medications Ordered in ED Medications  sodium chloride 0.9 % bolus 1,000 mL (1,000 mLs Intravenous New Bag/Given 10/28/18 1909)  potassium chloride SA (K-DUR) CR tablet 40 mEq (40 mEq Oral Given 10/28/18 1959)     Initial Impression / Assessment and Plan / ED Course  I have reviewed the triage vital signs and the nursing notes.  Pertinent labs & imaging results that were available during my care of the patient were reviewed by me and considered in my medical decision making (see chart  for details).        Katie [name] is a 63 year old female history of fibromyalgia, hypertension who presents to the ED with dehydration.  Patient with unremarkable vitals.  No fever.  Patient on antibiotics for sinus infection.  Feels dehydrated.  Denies any nausea, vomiting, chest pain, shortness of breath, abdominal pain.  States that she is concerned about her electrolytes.  Lab work overall unremarkable.  Potassium was 3.3.  Was repleted.  Urinalysis shows no significant findings.  Patient felt better after IV fluids.  Urinalysis no infection.  Chest x-ray with no signs of infection.  Patient possibly with some mild dehydration.  Recommend follow-up with primary care doctor and discharged in ED in good condition.  This chart was dictated using voice recognition software.  Despite best efforts to proofread,  errors can occur which can change the documentation meaning.    Final Clinical Impressions(s) / ED Diagnoses   Final diagnoses:  Dehydration    ED Discharge Orders    None       Lennice Sites, DO 10/28/18 2000

## 2018-10-28 NOTE — ED Notes (Signed)
Pt reports "multiple chronic issues" - increased stress at home and recent dx of sinus infection. Pt reports feeling run down and weak. Pt reports SOB. Appears to be in NAD during assessment. Pt denies fevers, cough, sore throat. Pt denies chest pain.

## 2018-10-28 NOTE — ED Triage Notes (Signed)
States she feels weak. Hx of recent stress and sinus infection.

## 2018-10-28 NOTE — ED Notes (Signed)
Patient transported to X-ray 

## 2018-11-08 DIAGNOSIS — I498 Other specified cardiac arrhythmias: Secondary | ICD-10-CM | POA: Diagnosis not present

## 2018-11-11 DIAGNOSIS — H26491 Other secondary cataract, right eye: Secondary | ICD-10-CM | POA: Diagnosis not present

## 2018-11-12 ENCOUNTER — Other Ambulatory Visit: Payer: Self-pay

## 2018-11-12 ENCOUNTER — Emergency Department (HOSPITAL_BASED_OUTPATIENT_CLINIC_OR_DEPARTMENT_OTHER)
Admission: EM | Admit: 2018-11-12 | Discharge: 2018-11-12 | Disposition: A | Discharge status: Home / Self Care | Payer: Medicare HMO | Attending: Emergency Medicine | Admitting: Emergency Medicine

## 2018-11-12 ENCOUNTER — Encounter (HOSPITAL_BASED_OUTPATIENT_CLINIC_OR_DEPARTMENT_OTHER): Payer: Self-pay | Admitting: *Deleted

## 2018-11-12 DIAGNOSIS — R5383 Other fatigue: Secondary | ICD-10-CM | POA: Diagnosis not present

## 2018-11-12 DIAGNOSIS — R197 Diarrhea, unspecified: Secondary | ICD-10-CM | POA: Diagnosis not present

## 2018-11-12 DIAGNOSIS — A6920 Lyme disease, unspecified: Secondary | ICD-10-CM | POA: Diagnosis not present

## 2018-11-12 DIAGNOSIS — Z20828 Contact with and (suspected) exposure to other viral communicable diseases: Secondary | ICD-10-CM | POA: Insufficient documentation

## 2018-11-12 DIAGNOSIS — I10 Essential (primary) hypertension: Secondary | ICD-10-CM | POA: Insufficient documentation

## 2018-11-12 DIAGNOSIS — E871 Hypo-osmolality and hyponatremia: Secondary | ICD-10-CM

## 2018-11-12 LAB — CBC WITH DIFFERENTIAL/PLATELET
Abs Immature Granulocytes: 0.01 10*3/uL (ref 0.00–0.07)
Basophils Absolute: 0.1 10*3/uL (ref 0.0–0.1)
Basophils Relative: 1 %
Eosinophils Absolute: 0.2 10*3/uL (ref 0.0–0.5)
Eosinophils Relative: 3 %
HCT: 43.6 % (ref 36.0–46.0)
Hemoglobin: 14 g/dL (ref 12.0–15.0)
Immature Granulocytes: 0 %
Lymphocytes Relative: 29 %
Lymphs Abs: 1.5 10*3/uL (ref 0.7–4.0)
MCH: 32 pg (ref 26.0–34.0)
MCHC: 32.1 g/dL (ref 30.0–36.0)
MCV: 99.8 fL (ref 80.0–100.0)
Monocytes Absolute: 0.4 10*3/uL (ref 0.1–1.0)
Monocytes Relative: 8 %
Neutro Abs: 3 10*3/uL (ref 1.7–7.7)
Neutrophils Relative %: 59 %
Platelets: 246 10*3/uL (ref 150–400)
RBC: 4.37 MIL/uL (ref 3.87–5.11)
RDW: 12.2 % (ref 11.5–15.5)
WBC: 4.9 10*3/uL (ref 4.0–10.5)
nRBC: 0 % (ref 0.0–0.2)

## 2018-11-12 LAB — COMPREHENSIVE METABOLIC PANEL
ALT: 19 U/L (ref 0–44)
AST: 23 U/L (ref 15–41)
Albumin: 3.9 g/dL (ref 3.5–5.0)
Alkaline Phosphatase: 79 U/L (ref 38–126)
Anion gap: 9 (ref 5–15)
BUN: 9 mg/dL (ref 8–23)
CO2: 25 mmol/L (ref 22–32)
Calcium: 8.9 mg/dL (ref 8.9–10.3)
Chloride: 99 mmol/L (ref 98–111)
Creatinine, Ser: 0.61 mg/dL (ref 0.44–1.00)
GFR calc Af Amer: >60 mL/min (ref >60)
GFR calc non Af Amer: >60 mL/min (ref >60)
Glucose, Bld: 95 mg/dL (ref 70–99)
Potassium: 3.7 mmol/L (ref 3.5–5.1)
Sodium: 133 mmol/L — ABNORMAL LOW (ref 135–145)
Total Bilirubin: 1.1 mg/dL (ref 0.3–1.2)
Total Protein: 7.6 g/dL (ref 6.5–8.1)

## 2018-11-12 LAB — URINALYSIS, ROUTINE W REFLEX MICROSCOPIC
Bilirubin Urine: NEGATIVE
Glucose, UA: NEGATIVE mg/dL
Ketones, ur: NEGATIVE mg/dL
Leukocytes,Ua: NEGATIVE
Nitrite: NEGATIVE
Protein, ur: NEGATIVE mg/dL
Specific Gravity, Urine: <1.005 — ABNORMAL LOW (ref 1.005–1.030)
pH: 7.5 (ref 5.0–8.0)

## 2018-11-12 LAB — URINALYSIS, MICROSCOPIC (REFLEX)

## 2018-11-12 MED ORDER — SODIUM CHLORIDE 0.9 % IV BOLUS
1000.0000 mL | Freq: Once | INTRAVENOUS | Status: AC
  Administered 2018-11-12: 1000 mL via INTRAVENOUS

## 2018-11-12 NOTE — ED Provider Notes (Signed)
[facility] EMERGENCY DEPARTMENT Provider Note   CSN: [id number] Arrival date & time: 11/12/18  1137     History   Chief Complaint Chief Complaint  Patient presents with   Diarrhea   Fatigue    HPI Katie [name] is a 63 y.o. female with a history of fibromyalgia, chronic fatigue syndrome, pots, chronic Lyme disease, and mast cell activation syndrome, anxiety, diverticulitis who presents to the emergency department with a chief complaint of fatigue.  The patient endorses worsening fatigue for the last 2 days.  The patient reports that she slept for 12 hours last night and can still barely get out of bed this morning.  She reports that her symptoms began after working around the house almost all day 48 hours ago.  She reports that she initially had some associated sinus pressure and was concerned that she was having a recurrent sinus infection so she completed a telemedicine visit and was started on Augmentin.  She reports this is her second course of Augmentin in the last 3 months.  She also reports that she has been having diarrhea.  She has had approximately 3 episodes of nonbloody diarrhea in the last 24 hours.  She reports some associated mild abdominal discomfort. she has been voiding without difficulty.  She reports that she was seen in the ER last week and was found to be dehydrated with electrolyte abnormalities.  She had her blood work rechecked earlier this week and it was found to be on the low end of normal.  She is also concerned that she had a positive COVID-19 contact last week.  She reports that her husband is a cancer survivor and has COPD and she is concerned that her symptoms may be related to COVID-19.  She denies vomiting, fever, chills, cough, shortness of breath, chest pain, urinary complaints, otalgia, URI complaints, headache, neck pain or stiffness, nasal congestion, or postnasal drip. She reports that she did have cataract surgery on her right eye  yesterday.  No visual changes or eye pain.     The history is provided by the patient. No language interpreter was used.    Past Medical History:  Diagnosis Date   Anxiety    Diverticulitis    Fibromyalgia    Hypertension    Lyme disease    Mast cell activation syndrome ([place])    Osteopenia 05/2017   T score -2.2 FRAX 11% / 1.7%   POTS (postural orthostatic tachycardia syndrome)     Patient Active Problem List   Diagnosis Date Noted   Diarrhea 11/14/2015   Acute gastroenteritis 11/14/2015   Dehydration    Enteritis 11/13/2015   Hyponatremia 11/13/2015   Elevated blood pressure reading 07/14/2013   Lyme disease 10/25/2012   Chronic fatigue syndrome 10/25/2012   Anxiety and depression 10/25/2012   Fibromyalgia 10/25/2012   Hormone replacement therapy - followed by Dr. Lonia Skinner in gyn and by [place] 10/25/2012    Past Surgical History:  Procedure Laterality Date   Cataract Removal With Lens Implant Placement Left 03-06-2015   CERVICAL BIOPSY  W/ LOOP ELECTRODE EXCISION  04/19/2004   HGSIL CIN II  MARGINS NEGATIVE   COLPOSCOPY  02/19/2004   HGSIL CIN II     OB History    Gravida  2   Para  2   Term      Preterm      AB      Living  2     SAB  TAB      Ectopic      Multiple      Live Births               Home Medications    Prior to Admission medications   Medication Sig Start Date End Date Taking? Authorizing Provider  cetirizine (ZYRTEC ALLERGY) 10 MG tablet Take 10 mg by mouth daily.    [provider]  Cholecalciferol (VITAMIN D-3) 5000 UNITS TABS Take 5,000 tablets by mouth daily.    [provider]  Coenzyme Q10 (CO Q10 PO) Take 400 mg by mouth daily.    [provider]  Ketotifen Fumarate POWD Take 0.25 mg by mouth.     [provider]  METHYLCOBALAMIN PO Take by mouth.    [provider]  montelukast (SINGULAIR) 10 MG tablet Take 10 mg by  mouth at bedtime.    [provider]  Omega-3 Fatty Acids (FISH OIL PO) Take 2 capsules by mouth 2 (two) times daily. BARLEANS FISH OIL    [provider]  pantoprazole (PROTONIX) 40 MG tablet Take 40 mg by mouth daily.    [provider]  Probiotic Product (PROBIOTIC ADVANCED PO) Take 1 capsule by mouth 2 (two) times daily. 100 BILLION    [provider]  QUERCETIN PO Take by mouth.    [provider]  UNABLE TO FIND biestro- care 2 pumps daily it varies progestacare - 3 pumps daily it varies    [provider]    Family History Family History  Problem Relation Age of Onset   Heart disease Mother        CHF   Hypertension Mother    Cancer Father 72       lung cancer   Hypertension Father    Hypertension Paternal Grandmother    Stroke Paternal Grandmother    Hypertension Paternal Grandfather    Mental retardation Son     Social History Social History   Tobacco Use   Smoking status: Never Smoker   Smokeless tobacco: Never Used  Substance Use Topics   Alcohol use: Yes    Comment: occ   Drug use: No     Allergies   Isovue [iopamidol], Azithromycin, Ciprofloxacin, Flagyl [metronidazole], Prednisone, Sulfa antibiotics, Sulfacetamide sodium, and Wheat bran   Review of Systems Review of Systems  Constitutional: Positive for fatigue. Negative for activity change, chills, fever and unexpected weight change.  HENT: Positive for sinus pressure. Negative for congestion, facial swelling, postnasal drip, sinus pain, sore throat and trouble swallowing.   Respiratory: Negative for shortness of breath and wheezing.   Cardiovascular: Negative for chest pain, palpitations and leg swelling.  Gastrointestinal: Positive for abdominal pain and diarrhea. Negative for nausea and vomiting.  Genitourinary: Negative for dysuria, frequency and urgency.  Musculoskeletal: Negative for back pain, myalgias, neck pain and neck  stiffness.  Skin: Negative for rash.  Allergic/Immunologic: Negative for immunocompromised state.  Neurological: Negative for dizziness, weakness, numbness and headaches.  Psychiatric/Behavioral: Negative for confusion.     Physical Exam Updated Vital Signs BP (!) 180/95    Pulse 84    Temp 98 F (36.7 C) (Oral)    Resp 18    Ht 5\' 7"  (1.702 m)    Wt 72.6 kg    LMP 03/15/2013    SpO2 100%    BMI 25.06 kg/m   Physical Exam Vitals signs and nursing note reviewed.  Constitutional:      General: She is  not in acute distress.    Comments: Anxious appearing  HENT:     Head: Normocephalic.     Right Ear: Hearing and tympanic membrane normal.     Left Ear: Hearing and tympanic membrane normal.     Nose: Nose normal.     Right Sinus: No maxillary sinus tenderness or frontal sinus tenderness.     Left Sinus: No maxillary sinus tenderness or frontal sinus tenderness.     Mouth/Throat:     Comments: Tongue is dry, but lips are moist. Eyes:     Conjunctiva/sclera: Conjunctivae normal.  Neck:     Musculoskeletal: Neck supple.  Cardiovascular:     Rate and Rhythm: Normal rate and regular rhythm.     Pulses: Normal pulses.     Heart sounds: Normal heart sounds. No murmur. No friction rub. No gallop.   Pulmonary:     Effort: Pulmonary effort is normal. No respiratory distress.     Breath sounds: No stridor. No wheezing, rhonchi or rales.  Chest:     Chest wall: No tenderness.  Abdominal:     General: There is no distension.     Palpations: Abdomen is soft.     Tenderness: There is abdominal tenderness.     Comments: Mild tenderness to palpation to the left upper and lower quadrant.  Abdomen is soft, nondistended.  No rebound or guarding.  No CVA tenderness bilaterally.  No epigastric or right-sided abdominal pain.  Normoactive bowel sounds.  Skin:    General: Skin is warm.     Capillary Refill: Capillary refill takes 2 to 3 seconds.     Findings: No rash.  Neurological:     Mental  Status: She is alert.  Psychiatric:        Behavior: Behavior normal.      ED Treatments / Results  Labs (all labs ordered are listed, but only abnormal results are displayed) Labs Reviewed  NOVEL CORONAVIRUS, NAA (HOSPITAL ORDER, SEND-OUT TO REF LAB)  URINALYSIS, ROUTINE W REFLEX MICROSCOPIC  CBC WITH DIFFERENTIAL/PLATELET  COMPREHENSIVE METABOLIC PANEL    EKG None  Radiology No results found.  Procedures Procedures (including critical care time)  Medications Ordered in ED Medications  sodium chloride 0.9 % bolus 1,000 mL (has no administration in time range)     Initial Impression / Assessment and Plan / ED Course  I have reviewed the triage vital signs and the nursing notes.  Pertinent labs & imaging results that were available during my care of the patient were reviewed by me and considered in my medical decision making (see chart for details).        63 year old female with a history of fibromyalgia, chronic fatigue syndrome, pots, chronic Lyme disease, and mast cell activation syndrome, anxiety, diverticulitis presenting with fatigue for the last 48 hours, abdominal pain, and nonbloody diarrhea.  She initially had some sinus pressure, which has since resolved.  She was started on Augmentin 2 days ago for a presumed sinus infection.  Diarrhea began after starting Augmentin, and I suspect may be the source of her abdominal discomfort and diarrhea.  She is concerned about a positive COVID-19 contact last week and given that she has a high risk family member, will do send out COVID-19 test.  We will check her electrolytes and hydrate with IV fluids as she does appear mildly dehydrated on exam.  Patient was discussed with Dr. Wilson Singer, attending physician.  Mild hyponatremia of 133.  She appears to have mild longstanding history  of mild hyponatremia despite regularly consuming electrolyte drinks at home.  UA also with low specific gravity, which appears to be chronic.  She is  not having any urinary symptoms, but says she is having mild abdominal discomfort, will send UA.  Low suspicion for UTI at this time though.  Labs are otherwise reassuring.  She is mildly hypertensive systolic pressure in the 568L.  Otherwise, she is hemodynamically stable.  Low suspicion for diverticulitis, sepsis, appendicitis, cholecystitis, choledocholithiasis, pancreatitis, or other intra-abdominal pathology.  I discussed with the patient that of a low suspicion she is having acute bacterial sinusitis.  Recommended discontinuing Augmentin.  Recommended follow-up with PCP.  She was given return precautions to the ER.  She is hemodynamically stable in no acute distress.  Safe for discharge home with outpatient follow-up.  Final Clinical Impressions(s) / ED Diagnoses   Final diagnoses:  None    ED Discharge Orders    None       Joanne Gavel, PA-C 11/12/18 1616    Virgel Manifold, MD 11/13/18 [phone]

## 2018-11-12 NOTE — ED Notes (Signed)
She has Mast Cell attacks per pt.

## 2018-11-12 NOTE — ED Notes (Signed)
Pt. Asking for IV fluids and highly anxious for IV fluids due to B/P.

## 2018-11-12 NOTE — ED Notes (Signed)
Pt ambulated to RR unassisted 

## 2018-11-12 NOTE — Discharge Instructions (Addendum)
Thank you for allowing me to care for you today in the Emergency Department.   Stop taking Augmentin. This may be contributing to your symptoms and making it worse.   Call primary care to schedule a follow up appointment. You may need to have your urine worked up. One consideration for your symptoms is SIADH.   Return to the emergency department if you develop fatigue with a fever, if you stop making urine, if you develop persistent vomiting, if you develop severe shortness of breath and chest pain, black or bloody diarrhea, or other new, concerning symptoms.  Your COVID-19 test is pending.  Return to the emergency department if you pass out, if you develop black or bloody diarrhea, if you develop a high fever, if you stop making urine, if you develop persistent vomiting, or other new, concerning symptoms.

## 2018-11-12 NOTE — ED Notes (Signed)
Pt. Does have benadryl on board 25mg  took at 10:30am

## 2018-11-12 NOTE — ED Triage Notes (Signed)
Fatigue x 2 days. She thought she had a sinus infection. She was started on an antibiotic. This am she had an episode of diarrhea.

## 2018-11-13 LAB — NOVEL CORONAVIRUS, NAA (HOSP ORDER, SEND-OUT TO REF LAB; TAT 18-24 HRS): SARS-CoV-2, NAA: NOT DETECTED

## 2018-11-14 LAB — URINE CULTURE: Culture: NO GROWTH

## 2018-11-18 DIAGNOSIS — R350 Frequency of micturition: Secondary | ICD-10-CM | POA: Diagnosis not present

## 2018-11-23 DIAGNOSIS — I951 Orthostatic hypotension: Secondary | ICD-10-CM | POA: Diagnosis not present

## 2018-11-23 DIAGNOSIS — J329 Chronic sinusitis, unspecified: Secondary | ICD-10-CM | POA: Diagnosis not present

## 2018-11-23 DIAGNOSIS — R350 Frequency of micturition: Secondary | ICD-10-CM | POA: Diagnosis not present

## 2018-11-23 DIAGNOSIS — D8940 Mast cell activation, unspecified: Secondary | ICD-10-CM | POA: Diagnosis not present

## 2018-11-23 DIAGNOSIS — R69 Illness, unspecified: Secondary | ICD-10-CM | POA: Diagnosis not present

## 2018-11-23 DIAGNOSIS — A6920 Lyme disease, unspecified: Secondary | ICD-10-CM | POA: Diagnosis not present

## 2018-12-03 ENCOUNTER — Emergency Department (HOSPITAL_BASED_OUTPATIENT_CLINIC_OR_DEPARTMENT_OTHER)
Admission: EM | Admit: 2018-12-03 | Discharge: 2018-12-04 | Disposition: A | Discharge status: Home / Self Care | Payer: Medicare HMO | Attending: Emergency Medicine | Admitting: Emergency Medicine

## 2018-12-03 ENCOUNTER — Other Ambulatory Visit: Payer: Self-pay

## 2018-12-03 ENCOUNTER — Encounter (HOSPITAL_BASED_OUTPATIENT_CLINIC_OR_DEPARTMENT_OTHER): Payer: Self-pay

## 2018-12-03 DIAGNOSIS — R197 Diarrhea, unspecified: Secondary | ICD-10-CM | POA: Diagnosis not present

## 2018-12-03 DIAGNOSIS — Z20828 Contact with and (suspected) exposure to other viral communicable diseases: Secondary | ICD-10-CM | POA: Diagnosis not present

## 2018-12-03 DIAGNOSIS — R1110 Vomiting, unspecified: Secondary | ICD-10-CM | POA: Diagnosis not present

## 2018-12-03 DIAGNOSIS — I10 Essential (primary) hypertension: Secondary | ICD-10-CM | POA: Insufficient documentation

## 2018-12-03 LAB — URINALYSIS, ROUTINE W REFLEX MICROSCOPIC
Bilirubin Urine: NEGATIVE
Glucose, UA: NEGATIVE mg/dL
Ketones, ur: NEGATIVE mg/dL
Nitrite: NEGATIVE
Protein, ur: NEGATIVE mg/dL
Specific Gravity, Urine: 1.02 (ref 1.005–1.030)
pH: 6 (ref 5.0–8.0)

## 2018-12-03 LAB — COMPREHENSIVE METABOLIC PANEL
ALT: 19 U/L (ref 0–44)
AST: 21 U/L (ref 15–41)
Albumin: 4 g/dL (ref 3.5–5.0)
Alkaline Phosphatase: 80 U/L (ref 38–126)
Anion gap: 12 (ref 5–15)
BUN: 10 mg/dL (ref 8–23)
CO2: 23 mmol/L (ref 22–32)
Calcium: 8.6 mg/dL — ABNORMAL LOW (ref 8.9–10.3)
Chloride: 96 mmol/L — ABNORMAL LOW (ref 98–111)
Creatinine, Ser: 0.83 mg/dL (ref 0.44–1.00)
GFR calc Af Amer: >60 mL/min (ref >60)
GFR calc non Af Amer: >60 mL/min (ref >60)
Glucose, Bld: 119 mg/dL — ABNORMAL HIGH (ref 70–99)
Potassium: 3 mmol/L — ABNORMAL LOW (ref 3.5–5.1)
Sodium: 131 mmol/L — ABNORMAL LOW (ref 135–145)
Total Bilirubin: 0.9 mg/dL (ref 0.3–1.2)
Total Protein: 7.4 g/dL (ref 6.5–8.1)

## 2018-12-03 LAB — CBC
HCT: 41.7 % (ref 36.0–46.0)
Hemoglobin: 13.4 g/dL (ref 12.0–15.0)
MCH: 32.1 pg (ref 26.0–34.0)
MCHC: 32.1 g/dL (ref 30.0–36.0)
MCV: 99.8 fL (ref 80.0–100.0)
Platelets: 242 10*3/uL (ref 150–400)
RBC: 4.18 MIL/uL (ref 3.87–5.11)
RDW: 12 % (ref 11.5–15.5)
WBC: 5.7 10*3/uL (ref 4.0–10.5)
nRBC: 0 % (ref 0.0–0.2)

## 2018-12-03 LAB — MAGNESIUM: Magnesium: 1.9 mg/dL (ref 1.7–2.4)

## 2018-12-03 LAB — LIPASE, BLOOD: Lipase: 28 U/L (ref 11–51)

## 2018-12-03 LAB — URINALYSIS, MICROSCOPIC (REFLEX)

## 2018-12-03 MED ORDER — POTASSIUM CHLORIDE CRYS ER 20 MEQ PO TBCR
40.0000 meq | EXTENDED_RELEASE_TABLET | Freq: Once | ORAL | Status: AC
  Administered 2018-12-03: 40 meq via ORAL
  Filled 2018-12-03: qty 2

## 2018-12-03 MED ORDER — SODIUM CHLORIDE 0.9% FLUSH
3.0000 mL | Freq: Once | INTRAVENOUS | Status: DC
  Filled 2018-12-03: qty 3

## 2018-12-03 MED ORDER — MAGNESIUM SULFATE 2 GM/50ML IV SOLN
2.0000 g | Freq: Once | INTRAVENOUS | Status: AC
  Administered 2018-12-03: 2 g via INTRAVENOUS
  Filled 2018-12-03: qty 50

## 2018-12-03 MED ORDER — ONDANSETRON 4 MG PO TBDP
4.0000 mg | ORAL_TABLET | Freq: Once | ORAL | Status: AC
  Administered 2018-12-03: 4 mg via ORAL
  Filled 2018-12-03: qty 1

## 2018-12-03 MED ORDER — POTASSIUM CHLORIDE 10 MEQ/100ML IV SOLN
10.0000 meq | INTRAVENOUS | Status: AC
  Administered 2018-12-03 – 2018-12-04 (×2): 10 meq via INTRAVENOUS
  Filled 2018-12-03: qty 100

## 2018-12-03 MED ORDER — SODIUM CHLORIDE 0.9 % IV BOLUS
1000.0000 mL | Freq: Once | INTRAVENOUS | Status: AC
  Administered 2018-12-03: 1000 mL via INTRAVENOUS

## 2018-12-03 NOTE — ED Notes (Signed)
ED Provider at bedside. 

## 2018-12-03 NOTE — ED Triage Notes (Signed)
C/o nausea started last night-vomiting x today-states abd pain started after vomiting-NAD-steady gait

## 2018-12-03 NOTE — ED Notes (Signed)
Pt and family questioning length of stay and "what's taking so long?" Apologized to family, advised that MD would be in to see them very soon.

## 2018-12-03 NOTE — ED Notes (Signed)
Pt vomited undigested food in triage-zofran given

## 2018-12-04 LAB — SARS CORONAVIRUS 2 (TAT 6-24 HRS): SARS Coronavirus 2: NEGATIVE

## 2018-12-04 MED ORDER — ONDANSETRON 4 MG PO TBDP: 4.0000 mg | ORAL_TABLET | Freq: Three times a day (TID) | ORAL | 0 refills | Status: DC | PRN

## 2018-12-04 MED ORDER — ACETAMINOPHEN 325 MG PO TABS
650.0000 mg | ORAL_TABLET | Freq: Once | ORAL | Status: AC
  Administered 2018-12-04: 650 mg via ORAL
  Filled 2018-12-04: qty 2

## 2018-12-04 MED ORDER — DICYCLOMINE HCL 10 MG PO CAPS
10.0000 mg | ORAL_CAPSULE | Freq: Once | ORAL | Status: DC
  Filled 2018-12-04: qty 1

## 2018-12-04 MED ORDER — ACETAMINOPHEN 160 MG/5ML PO SOLN
650.0000 mg | Freq: Once | ORAL | Status: AC
  Administered 2018-12-04: 650 mg via ORAL
  Filled 2018-12-04: qty 20.3

## 2018-12-04 MED ORDER — AMOXICILLIN-POT CLAVULANATE 875-125 MG PO TABS: 1.0000 | ORAL_TABLET | Freq: Two times a day (BID) | ORAL | 0 refills | Status: DC

## 2018-12-04 NOTE — ED Provider Notes (Signed)
Emergency Department Provider Note   I have reviewed the triage vital signs and the nursing notes.   HISTORY  Chief Complaint Emesis   HPI Katie [name] is a 63 y.o. female who presents to ED for vomiting and diarrhea. Has h/o dysautonomia and frequent episodes of diarrhea but not usually vomiting. States that after she vomiteda few times she started having some crampy upper quadrant pain. Husband with a loose stool but no diarrhea. No other sick contacts. No fevers. No resp symptoms. No other associated symptoms.    No other associated or modifying symptoms.    Past Medical History:  Diagnosis Date  . Anxiety   . Diverticulitis   . Fibromyalgia   . Hypertension   . Lyme disease   . Mast cell activation syndrome ([place])   . Osteopenia 05/2017   T score -2.2 FRAX 11% / 1.7%  . POTS (postural orthostatic tachycardia syndrome)     Patient Active Problem List   Diagnosis Date Noted  . Diarrhea 11/14/2015  . Acute gastroenteritis 11/14/2015  . Dehydration   . Enteritis 11/13/2015  . Hyponatremia 11/13/2015  . Elevated blood pressure reading 07/14/2013  . Lyme disease 10/25/2012  . Chronic fatigue syndrome 10/25/2012  . Anxiety and depression 10/25/2012  . Fibromyalgia 10/25/2012  . Hormone replacement therapy - followed by Dr. Lonia Skinner in gyn and by [place] 10/25/2012    Past Surgical History:  Procedure Laterality Date  . Cataract Removal With Lens Implant Placement Left 03-06-2015  . CERVICAL BIOPSY  W/ LOOP ELECTRODE EXCISION  04/19/2004   HGSIL CIN II  MARGINS NEGATIVE  . COLPOSCOPY  02/19/2004   HGSIL CIN II    Current Outpatient Rx  . Order #: [id number] Class: Print  . Order #: [id number] Class: Historical Med  . Order #: [id number] Class: Historical Med  . Order #: [id number] Class: Historical Med  . Order #: [id number] Class: Historical Med  . Order #: [id number] Class: Historical Med  . Order #: [id number] Class: Historical Med  . Order #:  [id number] Class: Historical Med  . Order #: [id number] Class: Print  . Order #: [id number] Class: Historical Med  . Order #: [id number] Class: Historical Med  . Order #: [id number] Class: Historical Med  . Order #: [id number] Class: Historical Med    Allergies Isovue [iopamidol], Azithromycin, Ciprofloxacin, Flagyl [metronidazole], Prednisone, Sulfa antibiotics, Sulfacetamide sodium, and Wheat bran  Family History  Problem Relation Age of Onset  . Heart disease Mother        CHF  . Hypertension Mother   . Cancer Father 54       lung cancer  . Hypertension Father   . Hypertension Paternal Grandmother   . Stroke Paternal Grandmother   . Hypertension Paternal Grandfather   . Mental retardation Son     Social History Social History   Tobacco Use  . Smoking status: Never Smoker  . Smokeless tobacco: Never Used  Substance Use Topics  . Alcohol use: Yes    Comment: occ  . Drug use: No    Review of Systems  All other systems negative except as documented in the HPI. All pertinent positives and negatives as reviewed in the HPI. ____________________________________________   PHYSICAL EXAM:  VITAL SIGNS: ED Triage Vitals  Enc Vitals Group     BP 12/03/18 1946 (!) 179/111     Pulse Rate 12/03/18 1946 (!) 102     Resp 12/03/18 1946 20     Temp 12/03/18 1946 98.2 F (36.8 C)     Temp  Source 12/03/18 1946 Oral     SpO2 12/03/18 1946 99 %     Weight 12/03/18 1947 150 lb (68 kg)     Height 12/03/18 1947 5\' 7"  (1.702 m)     Head Circumference --      Peak Flow --      Pain Score 12/03/18 1946 8     Pain Loc --      Pain Edu? --      Excl. in [place]? --     Constitutional: Alert and oriented. Well appearing and in no acute distress. Eyes: Conjunctivae are normal. PERRL. EOMI. Head: Atraumatic. Nose: No congestion/rhinnorhea. Mouth/Throat: Mucous membranes are moist.  Oropharynx non-erythematous. Neck: No stridor.  No meningeal signs.   Cardiovascular: Normal rate, regular rhythm.  Good peripheral circulation. Grossly normal heart sounds.   Respiratory: Normal respiratory effort.  No retractions. Lungs CTAB. Gastrointestinal: Soft and nontender. No distention.  Musculoskeletal: No lower extremity tenderness nor edema. No gross deformities of extremities. Neurologic:  Normal speech and language. No gross focal neurologic deficits are appreciated.  Skin:  Skin is warm, dry and intact. No rash noted.  ____________________________________________   LABS (all labs ordered are listed, but only abnormal results are displayed)  Labs Reviewed  COMPREHENSIVE METABOLIC PANEL - Abnormal; Notable for the following components:      Result Value   Sodium 131 (*)    Potassium 3.0 (*)    Chloride 96 (*)    Glucose, Bld 119 (*)    Calcium 8.6 (*)    All other components within normal limits  URINALYSIS, ROUTINE W REFLEX MICROSCOPIC - Abnormal; Notable for the following components:   Hgb urine dipstick SMALL (*)    Leukocytes,Ua TRACE (*)    All other components within normal limits  URINALYSIS, MICROSCOPIC (REFLEX) - Abnormal; Notable for the following components:   Bacteria, UA FEW (*)    All other components within normal limits  SARS CORONAVIRUS 2  GASTROINTESTINAL PANEL BY PCR, STOOL (REPLACES STOOL CULTURE)  C DIFFICILE QUICK SCREEN W PCR REFLEX  URINE CULTURE  LIPASE, BLOOD  CBC  MAGNESIUM   ____________________________________________  RADIOLOGY  No results found.  ____________________________________________   PROCEDURES  Procedure(s) performed:   Procedures   ____________________________________________   INITIAL IMPRESSION / ASSESSMENT AND PLAN / ED COURSE  Enteritis? Doubt acute abdominal pathology at this time. No fever to suggest infection. Having BM's making obstruction unlikely. Patient symptomatically improved with fluids, zofran. K and Na replaced. Mg as well.    Pertinent labs & imaging results that were available during my care of  the patient were reviewed by me and considered in my medical decision making (see chart for details).  A medical screening exam was performed and I feel the patient has had an appropriate workup for their chief complaint at this time and likelihood of emergent condition existing is low. They have been counseled on decision, discharge, follow up and which symptoms necessitate immediate return to the emergency department. They or their family verbally stated understanding and agreement with plan and discharged in stable condition.   ____________________________________________  FINAL CLINICAL IMPRESSION(S) / ED DIAGNOSES  Final diagnoses:  Vomiting and diarrhea     MEDICATIONS GIVEN DURING THIS VISIT:  Medications  sodium chloride flush (NS) 0.9 % injection 3 mL (3 mLs Intravenous Not Given 12/03/18 2113)  dicyclomine (BENTYL) capsule 10 mg (10 mg Oral Refused 12/04/18 0058)  ondansetron (ZOFRAN-ODT) disintegrating tablet 4 mg (4 mg Oral Given 12/03/18 1953)  potassium chloride SA (K-DUR) CR tablet 40 mEq (40 mEq Oral Given 12/03/18 2348)  potassium chloride 10 mEq in 100 mL IVPB (0 mEq Intravenous Stopped 12/04/18 0215)  magnesium sulfate IVPB 2 g 50 mL (0 g Intravenous Stopped 12/04/18 0045)  sodium chloride 0.9 % bolus 1,000 mL (0 mLs Intravenous Stopped 12/04/18 0045)  acetaminophen (TYLENOL) tablet 650 mg (650 mg Oral Given 12/04/18 0054)  acetaminophen (TYLENOL) solution 650 mg (650 mg Oral Given 12/04/18 0154)     NEW OUTPATIENT MEDICATIONS STARTED DURING THIS VISIT:  Discharge Medication List as of 12/04/2018  2:55 AM    START taking these medications   Details  amoxicillin-clavulanate (AUGMENTIN) 875-125 MG tablet Take 1 tablet by mouth 2 (two) times daily. One po bid x 7 days, Starting Sat 12/04/2018, Print    ondansetron (ZOFRAN ODT) 4 MG disintegrating tablet Take 1 tablet (4 mg total) by mouth every 8 (eight) hours as needed for nausea or vomiting. 4mg  ODT q4 hours prn nausea/vomit,  Starting Sat 12/04/2018, Print        Note:  This note was prepared with assistance of Dragon voice recognition software. Occasional wrong-word or sound-a-like substitutions may have occurred due to the inherent limitations of voice recognition software.   Zuhair Lariccia, Corene Cornea, MD 12/04/18 [phone]

## 2018-12-04 NOTE — ED Notes (Signed)
Patient attempting to obtain specimen at this time. Bedside commode placed at bedside

## 2018-12-04 NOTE — ED Notes (Signed)
ED Provider at bedside discussing results and plan with patient.

## 2018-12-04 NOTE — ED Notes (Signed)
Pt states "Wait, it's not going through the IV, advised patient that tylenol wasn't given through IV. States I can't take this, I don't want to throw up again." Advised patient that she is tolerating PO fluids at this time and she would have to tolerate them to send her home. Pt sts "i'll try to work on it." Pt asked, "is the doctor coming back in here?" Advised the patient that the doctor would be back in at some point.

## 2018-12-04 NOTE — ED Notes (Signed)
ED provider at bedside.

## 2018-12-05 ENCOUNTER — Telehealth (HOSPITAL_BASED_OUTPATIENT_CLINIC_OR_DEPARTMENT_OTHER): Payer: Self-pay | Admitting: Emergency Medicine

## 2018-12-05 LAB — GASTROINTESTINAL PANEL BY PCR, STOOL (REPLACES STOOL CULTURE)

## 2018-12-06 DIAGNOSIS — R69 Illness, unspecified: Secondary | ICD-10-CM | POA: Diagnosis not present

## 2018-12-06 DIAGNOSIS — A6920 Lyme disease, unspecified: Secondary | ICD-10-CM | POA: Diagnosis not present

## 2018-12-06 DIAGNOSIS — D8940 Mast cell activation, unspecified: Secondary | ICD-10-CM | POA: Diagnosis not present

## 2018-12-06 DIAGNOSIS — J329 Chronic sinusitis, unspecified: Secondary | ICD-10-CM | POA: Diagnosis not present

## 2018-12-06 DIAGNOSIS — I951 Orthostatic hypotension: Secondary | ICD-10-CM | POA: Diagnosis not present

## 2018-12-06 DIAGNOSIS — E871 Hypo-osmolality and hyponatremia: Secondary | ICD-10-CM | POA: Diagnosis not present

## 2018-12-08 DIAGNOSIS — I498 Other specified cardiac arrhythmias: Secondary | ICD-10-CM | POA: Diagnosis not present

## 2018-12-19 ENCOUNTER — Emergency Department (HOSPITAL_BASED_OUTPATIENT_CLINIC_OR_DEPARTMENT_OTHER)
Admission: EM | Admit: 2018-12-19 | Discharge: 2018-12-19 | Disposition: A | Discharge status: Home / Self Care | Payer: Medicare HMO | Attending: Emergency Medicine | Admitting: Emergency Medicine

## 2018-12-19 ENCOUNTER — Emergency Department (HOSPITAL_BASED_OUTPATIENT_CLINIC_OR_DEPARTMENT_OTHER): Payer: Medicare HMO

## 2018-12-19 ENCOUNTER — Encounter (HOSPITAL_BASED_OUTPATIENT_CLINIC_OR_DEPARTMENT_OTHER): Payer: Self-pay | Admitting: *Deleted

## 2018-12-19 ENCOUNTER — Other Ambulatory Visit: Payer: Self-pay

## 2018-12-19 DIAGNOSIS — Y92009 Unspecified place in unspecified non-institutional (private) residence as the place of occurrence of the external cause: Secondary | ICD-10-CM | POA: Insufficient documentation

## 2018-12-19 DIAGNOSIS — S60211A Contusion of right wrist, initial encounter: Secondary | ICD-10-CM

## 2018-12-19 DIAGNOSIS — W228XXA Striking against or struck by other objects, initial encounter: Secondary | ICD-10-CM | POA: Diagnosis not present

## 2018-12-19 DIAGNOSIS — I10 Essential (primary) hypertension: Secondary | ICD-10-CM | POA: Diagnosis not present

## 2018-12-19 DIAGNOSIS — Y9389 Activity, other specified: Secondary | ICD-10-CM | POA: Diagnosis not present

## 2018-12-19 DIAGNOSIS — S6991XA Unspecified injury of right wrist, hand and finger(s), initial encounter: Secondary | ICD-10-CM | POA: Diagnosis present

## 2018-12-19 DIAGNOSIS — Z79899 Other long term (current) drug therapy: Secondary | ICD-10-CM | POA: Diagnosis not present

## 2018-12-19 DIAGNOSIS — Y998 Other external cause status: Secondary | ICD-10-CM | POA: Diagnosis not present

## 2018-12-19 DIAGNOSIS — M25531 Pain in right wrist: Secondary | ICD-10-CM | POA: Diagnosis not present

## 2018-12-19 DIAGNOSIS — R5383 Other fatigue: Secondary | ICD-10-CM | POA: Diagnosis not present

## 2018-12-19 LAB — COMPREHENSIVE METABOLIC PANEL
ALT: 18 U/L (ref 0–44)
AST: 19 U/L (ref 15–41)
Albumin: 4 g/dL (ref 3.5–5.0)
Alkaline Phosphatase: 79 U/L (ref 38–126)
Anion gap: 8 (ref 5–15)
BUN: 9 mg/dL (ref 8–23)
CO2: 27 mmol/L (ref 22–32)
Calcium: 9.2 mg/dL (ref 8.9–10.3)
Chloride: 103 mmol/L (ref 98–111)
Creatinine, Ser: 0.68 mg/dL (ref 0.44–1.00)
GFR calc Af Amer: >60 mL/min (ref >60)
GFR calc non Af Amer: >60 mL/min (ref >60)
Glucose, Bld: 87 mg/dL (ref 70–99)
Potassium: 3.3 mmol/L — ABNORMAL LOW (ref 3.5–5.1)
Sodium: 138 mmol/L (ref 135–145)
Total Bilirubin: 0.6 mg/dL (ref 0.3–1.2)
Total Protein: 7.4 g/dL (ref 6.5–8.1)

## 2018-12-19 LAB — CBC WITH DIFFERENTIAL/PLATELET
Abs Immature Granulocytes: 0.02 10*3/uL (ref 0.00–0.07)
Basophils Absolute: 0.1 10*3/uL (ref 0.0–0.1)
Basophils Relative: 1 %
Eosinophils Absolute: 0.3 10*3/uL (ref 0.0–0.5)
Eosinophils Relative: 4 %
HCT: 41.2 % (ref 36.0–46.0)
Hemoglobin: 13.5 g/dL (ref 12.0–15.0)
Immature Granulocytes: 0 %
Lymphocytes Relative: 34 %
Lymphs Abs: 2.3 10*3/uL (ref 0.7–4.0)
MCH: 32.5 pg (ref 26.0–34.0)
MCHC: 32.8 g/dL (ref 30.0–36.0)
MCV: 99.3 fL (ref 80.0–100.0)
Monocytes Absolute: 0.6 10*3/uL (ref 0.1–1.0)
Monocytes Relative: 8 %
Neutro Abs: 3.7 10*3/uL (ref 1.7–7.7)
Neutrophils Relative %: 53 %
Platelets: 290 10*3/uL (ref 150–400)
RBC: 4.15 MIL/uL (ref 3.87–5.11)
RDW: 12.6 % (ref 11.5–15.5)
WBC: 6.9 10*3/uL (ref 4.0–10.5)
nRBC: 0 % (ref 0.0–0.2)

## 2018-12-19 MED ORDER — POTASSIUM CHLORIDE CRYS ER 20 MEQ PO TBCR
40.0000 meq | EXTENDED_RELEASE_TABLET | Freq: Once | ORAL | Status: AC
  Administered 2018-12-19: 40 meq via ORAL
  Filled 2018-12-19: qty 2

## 2018-12-19 MED ORDER — SODIUM CHLORIDE 0.9 % IV BOLUS
1000.0000 mL | Freq: Once | INTRAVENOUS | Status: AC
  Administered 2018-12-19: 1000 mL via INTRAVENOUS

## 2018-12-19 NOTE — ED Notes (Signed)
Pt understood dc material. NAD noted. All questions answered to satisfaction. Pt escorted to check out counter.

## 2018-12-19 NOTE — Discharge Instructions (Signed)
Your xray of R wrist is normal. It is likely a bone bruise causing pain.  Wear ACE wrap for support and comfort.  Your labs are reassuring.  Your potassium is 3.3.  eat a banana daily for 1 week.  Follow up with your doctor for further care.

## 2018-12-19 NOTE — ED Provider Notes (Signed)
[facility] EMERGENCY DEPARTMENT Provider Note   CSN: [id number] Arrival date & time: 12/19/18  1826     History   Chief Complaint Chief Complaint  Patient presents with  . Fatigue  . Wrist Pain    HPI Katie RUSSI is a 63 y.o. female.     The history is provided by the patient and medical records. No language interpreter was used.  Wrist Pain     63 year old female with history of fibromyalgia, osteopenia, pots, anxiety presenting for evaluation of wrist injury and generalized fatigue.  Patient report increasing stress with taking care of her mentally handicapped child for the past week.  She felt fatigue and lightheadedness and felt that this likely related to her pots.  She is concern of potential electrolytes imbalance and would like to have her electrolytes checked.  Furthermore, today a storm door struck her right wrist a few hours ago.  She report acute onset of sharp pain to her wrist.  Pain is moderate in severity, nonradiating, worse with palpation.  She would like to have it evaluated.  She denies any numbness.  She is right-hand dominant.  She denies any other injury.  No complaint of fever or chills, no chest pain shortness of breath productive cough or any recent cold symptoms.  Denies any recent sick contact.  Past Medical History:  Diagnosis Date  . Anxiety   . Diverticulitis   . Fibromyalgia   . Hypertension   . Lyme disease   . Mast cell activation syndrome ([place])   . Osteopenia 05/2017   T score -2.2 FRAX 11% / 1.7%  . POTS (postural orthostatic tachycardia syndrome)     Patient Active Problem List   Diagnosis Date Noted  . Diarrhea 11/14/2015  . Acute gastroenteritis 11/14/2015  . Dehydration   . Enteritis 11/13/2015  . Hyponatremia 11/13/2015  . Elevated blood pressure reading 07/14/2013  . Lyme disease 10/25/2012  . Chronic fatigue syndrome 10/25/2012  . Anxiety and depression 10/25/2012  . Fibromyalgia 10/25/2012  . Hormone  replacement therapy - followed by Dr. Lonia Skinner in gyn and by [place] 10/25/2012    Past Surgical History:  Procedure Laterality Date  . Cataract Removal With Lens Implant Placement Left 03-06-2015  . CERVICAL BIOPSY  W/ LOOP ELECTRODE EXCISION  04/19/2004   HGSIL CIN II  MARGINS NEGATIVE  . COLPOSCOPY  02/19/2004   HGSIL CIN II     OB History    Gravida  2   Para  2   Term      Preterm      AB      Living  2     SAB      TAB      Ectopic      Multiple      Live Births               Home Medications    Prior to Admission medications   Medication Sig Start Date End Date Taking? Authorizing Provider  amoxicillin-clavulanate (AUGMENTIN) 875-125 MG tablet Take 1 tablet by mouth 2 (two) times daily. One po bid x 7 days 12/04/18   Mesner, Corene Cornea, MD  cetirizine (ZYRTEC ALLERGY) 10 MG tablet Take 10 mg by mouth daily.    [provider]  Cholecalciferol (VITAMIN D-3) 5000 UNITS TABS Take 5,000 tablets by mouth daily.    [provider]  Coenzyme Q10 (CO Q10 PO) Take 400 mg by mouth daily.  [provider]  Ketotifen Fumarate POWD Take 0.25 mg by mouth.     [provider]  METHYLCOBALAMIN PO Take by mouth.    [provider]  montelukast (SINGULAIR) 10 MG tablet Take 10 mg by mouth at bedtime.    [provider]  Omega-3 Fatty Acids (FISH OIL PO) Take 2 capsules by mouth 2 (two) times daily. BARLEANS FISH OIL    [provider]  ondansetron (ZOFRAN ODT) 4 MG disintegrating tablet Take 1 tablet (4 mg total) by mouth every 8 (eight) hours as needed for nausea or vomiting. 4mg  ODT q4 hours prn nausea/vomit 12/04/18   Mesner, Corene Cornea, MD  pantoprazole (PROTONIX) 40 MG tablet Take 40 mg by mouth daily.    [provider]  Probiotic Product (PROBIOTIC ADVANCED PO) Take 1 capsule by mouth 2 (two) times daily. 100 BILLION    [provider]  QUERCETIN PO Take by mouth.     [provider]  UNABLE TO FIND biestro- care 2 pumps daily it varies progestacare - 3 pumps daily it varies    [provider]    Family History Family History  Problem Relation Age of Onset  . Heart disease Mother        CHF  . Hypertension Mother   . Cancer Father 72       lung cancer  . Hypertension Father   . Hypertension Paternal Grandmother   . Stroke Paternal Grandmother   . Hypertension Paternal Grandfather   . Mental retardation Son     Social History Social History   Tobacco Use  . Smoking status: Never Smoker  . Smokeless tobacco: Never Used  Substance Use Topics  . Alcohol use: Yes    Comment: occ  . Drug use: No     Allergies   Isovue [iopamidol], Azithromycin, Ciprofloxacin, Flagyl [metronidazole], Prednisone, Sulfa antibiotics, Sulfacetamide sodium, and Wheat bran   Review of Systems Review of Systems  All other systems reviewed and are negative.    Physical Exam Updated Vital Signs BP (!) 158/103 (BP Location: Left Arm)   Pulse 92   Temp 98 F (36.7 C) (Oral)   Resp 18   Ht 5\' 7"  (1.702 m)   Wt 70.3 kg   LMP 03/15/2013   SpO2 100%   BMI 24.28 kg/m   Physical Exam Vitals signs and nursing note reviewed.  Constitutional:      General: She is not in acute distress.    Appearance: She is well-developed.  HENT:     Head: Atraumatic.  Eyes:     Conjunctiva/sclera: Conjunctivae normal.  Neck:     Musculoskeletal: Neck supple.  Cardiovascular:     Rate and Rhythm: Normal rate and regular rhythm.     Pulses: Normal pulses.     Heart sounds: Normal heart sounds.  Pulmonary:     Effort: Pulmonary effort is normal.     Breath sounds: Normal breath sounds.  Abdominal:     Palpations: Abdomen is soft.     Tenderness: There is no abdominal tenderness.  Musculoskeletal:        General: Tenderness (Right wrist: Point tenderness noted to the ulnar aspect of wrist with mild swelling noted.  Normal wrist flexion extension  supination and pronation.  Radial pulse 2+.  No deformity.  Normal grip strength.) present.     Comments: 5/5 strength to all 4 extremities  Skin:    Findings: No rash.  Neurological:     Mental Status:  She is alert and oriented to person, place, and time.  Psychiatric:        Mood and Affect: Mood normal.      ED Treatments / Results  Labs (all labs ordered are listed, but only abnormal results are displayed) Labs Reviewed  COMPREHENSIVE METABOLIC PANEL - Abnormal; Notable for the following components:      Result Value   Potassium 3.3 (*)    All other components within normal limits  CBC WITH DIFFERENTIAL/PLATELET    EKG None  Radiology Dg Wrist Complete Right  Result Date: 12/19/2018 CLINICAL DATA:  Right wrist pain after injury. Door slammed on wrist yesterday. Pain and swelling. EXAM: RIGHT WRIST - COMPLETE 3+ VIEW COMPARISON:  None. FINDINGS: There is no evidence of fracture or dislocation. There is no evidence of arthropathy or other focal bone abnormality. Scaphoid is normal. Soft tissues are unremarkable. IMPRESSION: Negative radiographs of the right wrist. Electronically Signed   By: Keith Rake M.D.   On: 12/19/2018 19:06    Procedures Procedures (including critical care time)  Medications Ordered in ED Medications  potassium chloride SA (K-DUR) CR tablet 40 mEq (has no administration in time range)  sodium chloride 0.9 % bolus 1,000 mL (1,000 mLs Intravenous New Bag/Given 12/19/18 2104)     Initial Impression / Assessment and Plan / ED Course  I have reviewed the triage vital signs and the nursing notes.  Pertinent labs & imaging results that were available during my care of the patient were reviewed by me and considered in my medical decision making (see chart for details).        BP (!) 158/103 (BP Location: Left Arm)   Pulse 92   Temp 98 F (36.7 C) (Oral)   Resp 18   Ht 5\' 7"  (1.702 m)   Wt 70.3 kg   LMP 03/15/2013   SpO2 100%   BMI  24.28 kg/m    Final Clinical Impressions(s) / ED Diagnoses   Final diagnoses:  Contusion of right wrist, initial encounter  Fatigue, unspecified type    ED Discharge Orders    None     9:01 PM Patient with history of pots here with generalized fatigue and related to increasing exertion and increasing stress.  Will check basic labs.  Will give IV fluid.  She also injured her right wrist.  X-ray of the wrist without any acute finding.  Will provide Ace wrap.  10:01 PM Labs obtained and reassuring.  Mild hypokalemia with a potassium of 3.3, will give supplementation.  Sodium level is normal.  Hemoglobin is normal normal WBC.  Patient reassured.  At this time patient is stable for discharge.  She did receive IV fluid.   Domenic Moras, PA-C 12/19/18 2217    Tegeler, Gwenyth Allegra, MD 12/20/18 [phone]

## 2018-12-19 NOTE — ED Notes (Signed)
Pt ambulated to restroom without assistance, steady gait. NAD noted

## 2018-12-19 NOTE — ED Notes (Signed)
Pt given ice pack for comfort.  

## 2018-12-19 NOTE — ED Notes (Signed)
PMS intact before and after. Pt tolerated well. All questions answered. 

## 2018-12-19 NOTE — ED Notes (Signed)
EDP at bedside  

## 2018-12-19 NOTE — ED Triage Notes (Signed)
Pt reports she has a hx of POTS. States she has been under increased stress, feels weak,  and thinks her electrolytes may be off and she needs IV fluids. She also hit her right wrist against a storm door today and has a bump on her right arm.

## 2018-12-23 ENCOUNTER — Encounter (INDEPENDENT_AMBULATORY_CARE_PROVIDER_SITE_OTHER): Payer: Medicare HMO | Admitting: Ophthalmology

## 2018-12-24 DIAGNOSIS — J302 Other seasonal allergic rhinitis: Secondary | ICD-10-CM | POA: Diagnosis not present

## 2019-01-15 ENCOUNTER — Emergency Department (HOSPITAL_BASED_OUTPATIENT_CLINIC_OR_DEPARTMENT_OTHER)
Admission: EM | Admit: 2019-01-15 | Discharge: 2019-01-15 | Disposition: A | Discharge status: Home / Self Care | Payer: Medicare HMO | Attending: Emergency Medicine | Admitting: Emergency Medicine

## 2019-01-15 ENCOUNTER — Encounter (HOSPITAL_BASED_OUTPATIENT_CLINIC_OR_DEPARTMENT_OTHER): Payer: Self-pay

## 2019-01-15 ENCOUNTER — Other Ambulatory Visit: Payer: Self-pay

## 2019-01-15 DIAGNOSIS — R531 Weakness: Secondary | ICD-10-CM | POA: Diagnosis not present

## 2019-01-15 DIAGNOSIS — I10 Essential (primary) hypertension: Secondary | ICD-10-CM | POA: Diagnosis not present

## 2019-01-15 DIAGNOSIS — M6281 Muscle weakness (generalized): Secondary | ICD-10-CM | POA: Insufficient documentation

## 2019-01-15 LAB — CBC WITH DIFFERENTIAL/PLATELET
Abs Immature Granulocytes: 0.01 10*3/uL (ref 0.00–0.07)
Basophils Absolute: 0.1 10*3/uL (ref 0.0–0.1)
Basophils Relative: 1 %
Eosinophils Absolute: 0.2 10*3/uL (ref 0.0–0.5)
Eosinophils Relative: 3 %
HCT: 46 % (ref 36.0–46.0)
Hemoglobin: 15.1 g/dL — ABNORMAL HIGH (ref 12.0–15.0)
Immature Granulocytes: 0 %
Lymphocytes Relative: 35 %
Lymphs Abs: 2.1 10*3/uL (ref 0.7–4.0)
MCH: 33 pg (ref 26.0–34.0)
MCHC: 32.8 g/dL (ref 30.0–36.0)
MCV: 100.4 fL — ABNORMAL HIGH (ref 80.0–100.0)
Monocytes Absolute: 0.6 10*3/uL (ref 0.1–1.0)
Monocytes Relative: 10 %
Neutro Abs: 3 10*3/uL (ref 1.7–7.7)
Neutrophils Relative %: 51 %
Platelets: 312 10*3/uL (ref 150–400)
RBC: 4.58 MIL/uL (ref 3.87–5.11)
RDW: 13 % (ref 11.5–15.5)
WBC: 5.9 10*3/uL (ref 4.0–10.5)
nRBC: 0 % (ref 0.0–0.2)

## 2019-01-15 LAB — BASIC METABOLIC PANEL
Anion gap: 12 (ref 5–15)
BUN: 12 mg/dL (ref 8–23)
CO2: 26 mmol/L (ref 22–32)
Calcium: 9.3 mg/dL (ref 8.9–10.3)
Chloride: 98 mmol/L (ref 98–111)
Creatinine, Ser: 0.78 mg/dL (ref 0.44–1.00)
GFR calc Af Amer: >60 mL/min (ref >60)
GFR calc non Af Amer: >60 mL/min (ref >60)
Glucose, Bld: 102 mg/dL — ABNORMAL HIGH (ref 70–99)
Potassium: 3.8 mmol/L (ref 3.5–5.1)
Sodium: 136 mmol/L (ref 135–145)

## 2019-01-15 LAB — MAGNESIUM: Magnesium: 2.2 mg/dL (ref 1.7–2.4)

## 2019-01-15 MED ORDER — SODIUM CHLORIDE 0.9 % IV BOLUS
1000.0000 mL | Freq: Once | INTRAVENOUS | Status: AC
  Administered 2019-01-15: 1000 mL via INTRAVENOUS
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
## 2019-01-15 NOTE — Discharge Instructions (Signed)
Your seen in the emergency department today with generalized weakness type symptoms.  Your labs are within normal limits but you do seem somewhat dehydrated.  Please continue your home medications and follow-up with your doctor later this week.  Return to the emergency department with any new or suddenly worsening symptoms.
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
## 2019-01-15 NOTE — ED Notes (Signed)
Pt verbalized understanding of dc instructions.

## 2019-01-17 DIAGNOSIS — R5383 Other fatigue: Secondary | ICD-10-CM | POA: Diagnosis not present

## 2019-01-17 DIAGNOSIS — M542 Cervicalgia: Secondary | ICD-10-CM | POA: Diagnosis not present

## 2019-01-17 DIAGNOSIS — E039 Hypothyroidism, unspecified: Secondary | ICD-10-CM | POA: Diagnosis not present

## 2019-01-17 DIAGNOSIS — B370 Candidal stomatitis: Secondary | ICD-10-CM | POA: Diagnosis not present

## 2019-01-17 DIAGNOSIS — I10 Essential (primary) hypertension: Secondary | ICD-10-CM | POA: Diagnosis not present

## 2019-01-17 DIAGNOSIS — G909 Disorder of the autonomic nervous system, unspecified: Secondary | ICD-10-CM | POA: Diagnosis not present

## 2019-01-17 DIAGNOSIS — K581 Irritable bowel syndrome with constipation: Secondary | ICD-10-CM | POA: Diagnosis not present

## 2019-01-17 DIAGNOSIS — I951 Orthostatic hypotension: Secondary | ICD-10-CM | POA: Diagnosis not present

## 2019-01-17 DIAGNOSIS — D8940 Mast cell activation, unspecified: Secondary | ICD-10-CM | POA: Diagnosis not present

## 2019-01-17 DIAGNOSIS — M797 Fibromyalgia: Secondary | ICD-10-CM | POA: Diagnosis not present

## 2019-01-18 DIAGNOSIS — K279 Peptic ulcer, site unspecified, unspecified as acute or chronic, without hemorrhage or perforation: Secondary | ICD-10-CM | POA: Diagnosis not present

## 2019-01-18 DIAGNOSIS — Z1211 Encounter for screening for malignant neoplasm of colon: Secondary | ICD-10-CM | POA: Diagnosis not present

## 2019-01-18 DIAGNOSIS — R1013 Epigastric pain: Secondary | ICD-10-CM | POA: Diagnosis not present

## 2019-01-19 DIAGNOSIS — G4710 Hypersomnia, unspecified: Secondary | ICD-10-CM | POA: Diagnosis not present

## 2019-01-19 DIAGNOSIS — R0681 Apnea, not elsewhere classified: Secondary | ICD-10-CM | POA: Diagnosis not present

## 2019-01-19 DIAGNOSIS — R0683 Snoring: Secondary | ICD-10-CM | POA: Diagnosis not present

## 2019-01-26 DIAGNOSIS — Z1212 Encounter for screening for malignant neoplasm of rectum: Secondary | ICD-10-CM | POA: Diagnosis not present

## 2019-01-26 DIAGNOSIS — Z1211 Encounter for screening for malignant neoplasm of colon: Secondary | ICD-10-CM | POA: Diagnosis not present

## 2019-02-01 ENCOUNTER — Encounter: Payer: Self-pay | Admitting: Gynecology

## 2019-02-02 ENCOUNTER — Other Ambulatory Visit: Payer: Self-pay

## 2019-02-02 ENCOUNTER — Emergency Department (HOSPITAL_BASED_OUTPATIENT_CLINIC_OR_DEPARTMENT_OTHER)
Admission: EM | Admit: 2019-02-02 | Discharge: 2019-02-02 | Discharge status: Against Medical Advice | Payer: Medicare HMO

## 2019-02-03 DIAGNOSIS — G901 Familial dysautonomia [Riley-Day]: Secondary | ICD-10-CM | POA: Diagnosis not present

## 2019-02-03 DIAGNOSIS — I951 Orthostatic hypotension: Secondary | ICD-10-CM | POA: Diagnosis not present

## 2019-02-08 DIAGNOSIS — J329 Chronic sinusitis, unspecified: Secondary | ICD-10-CM | POA: Diagnosis not present

## 2019-02-08 DIAGNOSIS — Z882 Allergy status to sulfonamides status: Secondary | ICD-10-CM | POA: Diagnosis not present

## 2019-02-08 DIAGNOSIS — Z8639 Personal history of other endocrine, nutritional and metabolic disease: Secondary | ICD-10-CM | POA: Diagnosis not present

## 2019-02-08 DIAGNOSIS — G901 Familial dysautonomia [Riley-Day]: Secondary | ICD-10-CM | POA: Diagnosis not present

## 2019-02-08 DIAGNOSIS — Z8739 Personal history of other diseases of the musculoskeletal system and connective tissue: Secondary | ICD-10-CM | POA: Diagnosis not present

## 2019-02-08 DIAGNOSIS — I498 Other specified cardiac arrhythmias: Secondary | ICD-10-CM | POA: Diagnosis not present

## 2019-02-09 DIAGNOSIS — I951 Orthostatic hypotension: Secondary | ICD-10-CM | POA: Diagnosis not present

## 2019-02-09 DIAGNOSIS — I10 Essential (primary) hypertension: Secondary | ICD-10-CM | POA: Diagnosis not present

## 2019-02-09 DIAGNOSIS — R5383 Other fatigue: Secondary | ICD-10-CM | POA: Diagnosis not present

## 2019-02-09 DIAGNOSIS — K581 Irritable bowel syndrome with constipation: Secondary | ICD-10-CM | POA: Diagnosis not present

## 2019-02-09 DIAGNOSIS — G909 Disorder of the autonomic nervous system, unspecified: Secondary | ICD-10-CM | POA: Diagnosis not present

## 2019-02-09 DIAGNOSIS — M542 Cervicalgia: Secondary | ICD-10-CM | POA: Diagnosis not present

## 2019-02-09 DIAGNOSIS — E039 Hypothyroidism, unspecified: Secondary | ICD-10-CM | POA: Diagnosis not present

## 2019-02-09 DIAGNOSIS — B370 Candidal stomatitis: Secondary | ICD-10-CM | POA: Diagnosis not present

## 2019-02-09 DIAGNOSIS — M797 Fibromyalgia: Secondary | ICD-10-CM | POA: Diagnosis not present

## 2019-02-09 DIAGNOSIS — D8940 Mast cell activation, unspecified: Secondary | ICD-10-CM | POA: Diagnosis not present

## 2019-02-10 DIAGNOSIS — R5383 Other fatigue: Secondary | ICD-10-CM | POA: Diagnosis not present

## 2019-02-10 DIAGNOSIS — E039 Hypothyroidism, unspecified: Secondary | ICD-10-CM | POA: Diagnosis not present

## 2019-02-10 DIAGNOSIS — M542 Cervicalgia: Secondary | ICD-10-CM | POA: Diagnosis not present

## 2019-02-20 ENCOUNTER — Other Ambulatory Visit: Payer: Self-pay

## 2019-02-20 ENCOUNTER — Emergency Department (HOSPITAL_BASED_OUTPATIENT_CLINIC_OR_DEPARTMENT_OTHER)
Admission: EM | Admit: 2019-02-20 | Discharge: 2019-02-20 | Disposition: A | Discharge status: Home / Self Care | Payer: Medicare HMO | Attending: Emergency Medicine | Admitting: Emergency Medicine

## 2019-02-20 ENCOUNTER — Encounter (HOSPITAL_BASED_OUTPATIENT_CLINIC_OR_DEPARTMENT_OTHER): Payer: Self-pay | Admitting: *Deleted

## 2019-02-20 DIAGNOSIS — I498 Other specified cardiac arrhythmias: Secondary | ICD-10-CM | POA: Diagnosis not present

## 2019-02-20 DIAGNOSIS — E860 Dehydration: Secondary | ICD-10-CM | POA: Diagnosis present

## 2019-02-20 DIAGNOSIS — Z79899 Other long term (current) drug therapy: Secondary | ICD-10-CM | POA: Diagnosis not present

## 2019-02-20 DIAGNOSIS — I10 Essential (primary) hypertension: Secondary | ICD-10-CM | POA: Insufficient documentation

## 2019-02-20 DIAGNOSIS — G90A Postural orthostatic tachycardia syndrome (POTS): Secondary | ICD-10-CM

## 2019-02-20 LAB — BASIC METABOLIC PANEL
Anion gap: 9 (ref 5–15)
BUN: 9 mg/dL (ref 8–23)
CO2: 25 mmol/L (ref 22–32)
Calcium: 8.8 mg/dL — ABNORMAL LOW (ref 8.9–10.3)
Chloride: 102 mmol/L (ref 98–111)
Creatinine, Ser: 0.59 mg/dL (ref 0.44–1.00)
GFR calc Af Amer: >60 mL/min (ref >60)
GFR calc non Af Amer: >60 mL/min (ref >60)
Glucose, Bld: 106 mg/dL — ABNORMAL HIGH (ref 70–99)
Potassium: 3.8 mmol/L (ref 3.5–5.1)
Sodium: 136 mmol/L (ref 135–145)

## 2019-02-20 LAB — CBC WITH DIFFERENTIAL/PLATELET
Abs Immature Granulocytes: 0.01 10*3/uL (ref 0.00–0.07)
Basophils Absolute: 0 10*3/uL (ref 0.0–0.1)
Basophils Relative: 1 %
Eosinophils Absolute: 0.3 10*3/uL (ref 0.0–0.5)
Eosinophils Relative: 5 %
HCT: 40.7 % (ref 36.0–46.0)
Hemoglobin: 13.3 g/dL (ref 12.0–15.0)
Immature Granulocytes: 0 %
Lymphocytes Relative: 40 %
Lymphs Abs: 2.1 10*3/uL (ref 0.7–4.0)
MCH: 32.6 pg (ref 26.0–34.0)
MCHC: 32.7 g/dL (ref 30.0–36.0)
MCV: 99.8 fL (ref 80.0–100.0)
Monocytes Absolute: 0.5 10*3/uL (ref 0.1–1.0)
Monocytes Relative: 9 %
Neutro Abs: 2.4 10*3/uL (ref 1.7–7.7)
Neutrophils Relative %: 45 %
Platelets: 248 10*3/uL (ref 150–400)
RBC: 4.08 MIL/uL (ref 3.87–5.11)
RDW: 12.1 % (ref 11.5–15.5)
WBC: 5.3 10*3/uL (ref 4.0–10.5)
nRBC: 0 % (ref 0.0–0.2)

## 2019-02-20 MED ORDER — SODIUM CHLORIDE 0.9 % IV BOLUS
1000.0000 mL | Freq: Once | INTRAVENOUS | Status: AC
  Administered 2019-02-20: 1000 mL via INTRAVENOUS

## 2019-02-20 NOTE — ED Notes (Signed)
Pt was given Ginger Ale

## 2019-02-20 NOTE — ED Notes (Signed)
ED Provider at bedside. 

## 2019-02-20 NOTE — ED Provider Notes (Signed)
[facility] EMERGENCY DEPARTMENT Provider Note   CSN: [id number] Arrival date & time: 02/20/19  1821     History   Chief Complaint Chief Complaint  Patient presents with  . Dehydration    HPI Katie [name] is a 63 y.o. female.     Patient is a 63 year old female who presents with needing IV fluids.  She has a history of pots syndrome as well as fibromyalgia and autonomic dysfunction.  She states that she normally gets weekly IV fluids but missed it last week because she was out of town.  She says she overdid it last night when they got home due to her having to unpack.  She is not able to get IV fluids until later next week and she came here because she has had increased fatigue and feels like she needs IV fluids.  She says when she needs IV fluids she gets a little short of breath and her blood pressure and heart rate go up.  She says that these are her typical symptoms when she needs IV fluids and that is the reason she came in today.  She denies any other unusual symptoms.  No recent illnesses.     Past Medical History:  Diagnosis Date  . Anxiety   . Diverticulitis   . Fibromyalgia   . Hypertension   . Lyme disease   . Mast cell activation syndrome ([place])   . Osteopenia 05/2017   T score -2.2 FRAX 11% / 1.7%  . POTS (postural orthostatic tachycardia syndrome)     Patient Active Problem List   Diagnosis Date Noted  . Diarrhea 11/14/2015  . Acute gastroenteritis 11/14/2015  . Dehydration   . Enteritis 11/13/2015  . Hyponatremia 11/13/2015  . Elevated blood pressure reading 07/14/2013  . Lyme disease 10/25/2012  . Chronic fatigue syndrome 10/25/2012  . Anxiety and depression 10/25/2012  . Fibromyalgia 10/25/2012  . Hormone replacement therapy - followed by Dr. Lonia Skinner in gyn and by [facility] 10/25/2012    Past Surgical History:  Procedure Laterality Date  . Cataract Removal With Lens Implant Placement Left 03-06-2015  . CERVICAL  BIOPSY  W/ LOOP ELECTRODE EXCISION  04/19/2004   HGSIL CIN II  MARGINS NEGATIVE  . COLPOSCOPY  02/19/2004   HGSIL CIN II     OB History    Gravida  2   Para  2   Term      Preterm      AB      Living  2     SAB      TAB      Ectopic      Multiple      Live Births               Home Medications    Prior to Admission medications   Medication Sig Start Date End Date Taking? Authorizing Provider  amoxicillin-clavulanate (AUGMENTIN) 875-125 MG tablet Take 1 tablet by mouth 2 (two) times daily. One po bid x 7 days 12/04/18   Mesner, Corene Cornea, MD  cetirizine (ZYRTEC ALLERGY) 10 MG tablet Take 10 mg by mouth daily.    [provider]  Cholecalciferol (VITAMIN D-3) 5000 UNITS TABS Take 5,000 tablets by mouth daily.    [provider]  Coenzyme Q10 (CO Q10 PO) Take 400 mg by mouth daily.    [provider]  Ketotifen Fumarate POWD Take 0.25 mg by mouth.     [provider]  METHYLCOBALAMIN PO Take by mouth.    [provider]  montelukast (SINGULAIR) 10 MG tablet Take 10 mg by mouth at bedtime.    [provider]  Omega-3 Fatty Acids (FISH OIL PO) Take 2 capsules by mouth 2 (two) times daily. BARLEANS FISH OIL    [provider]  ondansetron (ZOFRAN ODT) 4 MG disintegrating tablet Take 1 tablet (4 mg total) by mouth every 8 (eight) hours as needed for nausea or vomiting. 4mg  ODT q4 hours prn nausea/vomit 12/04/18   Mesner, Corene Cornea, MD  pantoprazole (PROTONIX) 40 MG tablet Take 40 mg by mouth daily.    [provider]  Probiotic Product (PROBIOTIC ADVANCED PO) Take 1 capsule by mouth 2 (two) times daily. 100 BILLION    [provider]  QUERCETIN PO Take by mouth.    [provider]  UNABLE TO FIND biestro- care 2 pumps daily it varies progestacare - 3 pumps daily it varies    [provider]    Family History Family History  Problem Relation Age of Onset  . Heart disease Mother         CHF  . Hypertension Mother   . Cancer Father 1       lung cancer  . Hypertension Father   . Hypertension Paternal Grandmother   . Stroke Paternal Grandmother   . Hypertension Paternal Grandfather   . Mental retardation Son     Social History Social History   Tobacco Use  . Smoking status: Never Smoker  . Smokeless tobacco: Never Used  Substance Use Topics  . Alcohol use: Yes    Comment: occ  . Drug use: No     Allergies   Isovue [iopamidol], Azithromycin, Ciprofloxacin, Flagyl [metronidazole], Prednisone, Sulfa antibiotics, Sulfacetamide sodium, and Wheat bran   Review of Systems Review of Systems  Constitutional: Positive for fatigue. Negative for chills, diaphoresis and fever.  HENT: Negative for congestion, rhinorrhea and sneezing.   Eyes: Negative.   Respiratory: Positive for shortness of breath. Negative for cough and chest tightness.   Cardiovascular: Negative for chest pain and leg swelling.  Gastrointestinal: Negative for abdominal pain, blood in stool, diarrhea, nausea and vomiting.  Genitourinary: Negative for difficulty urinating, flank pain, frequency and hematuria.  Musculoskeletal: Negative for arthralgias and back pain.  Skin: Negative for rash.  Neurological: Negative for dizziness, speech difficulty, weakness, numbness and headaches.     Physical Exam Updated Vital Signs BP (!) 154/89 (BP Location: Right Arm)   Pulse 64   Temp 98.5 F (36.9 C) (Oral)   Resp 20   Ht 5\' 7"  (1.702 m)   Wt 72.6 kg   LMP 03/15/2013   SpO2 100%   BMI 25.06 kg/m   Physical Exam Constitutional:      Appearance: She is well-developed.  HENT:     Head: Normocephalic and atraumatic.  Eyes:     Pupils: Pupils are equal, round, and reactive to light.  Neck:     Musculoskeletal: Normal range of motion and neck supple.  Cardiovascular:     Rate and Rhythm: Normal rate and regular rhythm.     Heart sounds: Normal heart sounds.  Pulmonary:     Effort:  Pulmonary effort is normal. No respiratory distress.     Breath sounds: Normal breath sounds. No wheezing or rales.  Chest:     Chest wall: No tenderness.  Abdominal:     General: Bowel sounds are normal.     Palpations: Abdomen is soft.  Tenderness: There is no abdominal tenderness. There is no guarding or rebound.  Musculoskeletal: Normal range of motion.     Right lower leg: No edema.     Left lower leg: No edema.  Lymphadenopathy:     Cervical: No cervical adenopathy.  Skin:    General: Skin is warm and dry.     Findings: No rash.  Neurological:     Mental Status: She is alert and oriented to person, place, and time.      ED Treatments / Results  Labs (all labs ordered are listed, but only abnormal results are displayed) Labs Reviewed  BASIC METABOLIC PANEL - Abnormal; Notable for the following components:      Result Value   Glucose, Bld 106 (*)    Calcium 8.8 (*)    All other components within normal limits  CBC WITH DIFFERENTIAL/PLATELET    EKG None  Radiology No results found.  Procedures Procedures (including critical care time)  Medications Ordered in ED Medications  sodium chloride 0.9 % bolus 1,000 mL (1,000 mLs Intravenous New Bag/Given 02/20/19 1952)     Initial Impression / Assessment and Plan / ED Course  I have reviewed the triage vital signs and the nursing notes.  Pertinent labs & imaging results that were available during my care of the patient were reviewed by me and considered in my medical decision making (see chart for details).       Patient is feeling much better after IV fluids.  She feels back to baseline.  Her labs are nonconcerning.  She was discharged home.  Return precautions were given.   Final Clinical Impressions(s) / ED Diagnoses   Final diagnoses:  POTS (postural orthostatic tachycardia syndrome)    ED Discharge Orders    None       Malvin Johns, MD 02/20/19 2153

## 2019-02-20 NOTE — ED Triage Notes (Signed)
Pt reports that she gets IV fluids weekly.  States that she missed her appointment this week because she was out of town.  Reports 'tachycardia and hypertension' that started last night and continues today.

## 2019-02-21 DIAGNOSIS — I498 Other specified cardiac arrhythmias: Secondary | ICD-10-CM | POA: Diagnosis not present

## 2019-02-22 DIAGNOSIS — G4733 Obstructive sleep apnea (adult) (pediatric): Secondary | ICD-10-CM | POA: Diagnosis not present

## 2019-03-01 DIAGNOSIS — I498 Other specified cardiac arrhythmias: Secondary | ICD-10-CM | POA: Diagnosis not present

## 2019-03-02 DIAGNOSIS — G4733 Obstructive sleep apnea (adult) (pediatric): Secondary | ICD-10-CM | POA: Diagnosis not present

## 2019-03-09 DIAGNOSIS — Z20828 Contact with and (suspected) exposure to other viral communicable diseases: Secondary | ICD-10-CM | POA: Diagnosis not present

## 2019-03-09 DIAGNOSIS — J019 Acute sinusitis, unspecified: Secondary | ICD-10-CM | POA: Diagnosis not present

## 2019-03-28 DIAGNOSIS — L821 Other seborrheic keratosis: Secondary | ICD-10-CM | POA: Diagnosis not present

## 2019-03-28 DIAGNOSIS — D225 Melanocytic nevi of trunk: Secondary | ICD-10-CM | POA: Diagnosis not present

## 2019-03-28 DIAGNOSIS — L814 Other melanin hyperpigmentation: Secondary | ICD-10-CM | POA: Diagnosis not present

## 2019-03-28 DIAGNOSIS — I83813 Varicose veins of bilateral lower extremities with pain: Secondary | ICD-10-CM | POA: Diagnosis not present

## 2019-03-28 DIAGNOSIS — L579 Skin changes due to chronic exposure to nonionizing radiation, unspecified: Secondary | ICD-10-CM | POA: Diagnosis not present

## 2019-03-28 DIAGNOSIS — D1801 Hemangioma of skin and subcutaneous tissue: Secondary | ICD-10-CM | POA: Diagnosis not present

## 2019-03-28 DIAGNOSIS — I498 Other specified cardiac arrhythmias: Secondary | ICD-10-CM | POA: Diagnosis not present

## 2019-04-02 DIAGNOSIS — G4733 Obstructive sleep apnea (adult) (pediatric): Secondary | ICD-10-CM | POA: Diagnosis not present

## 2019-04-04 ENCOUNTER — Other Ambulatory Visit: Payer: Self-pay | Admitting: Obstetrics & Gynecology

## 2019-04-04 DIAGNOSIS — Z1231 Encounter for screening mammogram for malignant neoplasm of breast: Secondary | ICD-10-CM

## 2019-04-04 DIAGNOSIS — I498 Other specified cardiac arrhythmias: Secondary | ICD-10-CM | POA: Diagnosis not present

## 2019-04-04 DIAGNOSIS — G4733 Obstructive sleep apnea (adult) (pediatric): Secondary | ICD-10-CM | POA: Diagnosis not present

## 2019-04-07 DIAGNOSIS — Z20828 Contact with and (suspected) exposure to other viral communicable diseases: Secondary | ICD-10-CM | POA: Diagnosis not present

## 2019-04-08 DIAGNOSIS — G4733 Obstructive sleep apnea (adult) (pediatric): Secondary | ICD-10-CM | POA: Diagnosis not present

## 2019-04-15 DIAGNOSIS — I498 Other specified cardiac arrhythmias: Secondary | ICD-10-CM | POA: Diagnosis not present

## 2019-04-20 DIAGNOSIS — I498 Other specified cardiac arrhythmias: Secondary | ICD-10-CM | POA: Diagnosis not present

## 2019-04-20 DIAGNOSIS — G4733 Obstructive sleep apnea (adult) (pediatric): Secondary | ICD-10-CM | POA: Diagnosis not present

## 2019-04-20 DIAGNOSIS — Z9989 Dependence on other enabling machines and devices: Secondary | ICD-10-CM | POA: Diagnosis not present

## 2019-04-26 DIAGNOSIS — J019 Acute sinusitis, unspecified: Secondary | ICD-10-CM | POA: Diagnosis not present

## 2019-04-26 DIAGNOSIS — Z20828 Contact with and (suspected) exposure to other viral communicable diseases: Secondary | ICD-10-CM | POA: Diagnosis not present

## 2019-04-27 DIAGNOSIS — I498 Other specified cardiac arrhythmias: Secondary | ICD-10-CM | POA: Diagnosis not present

## 2019-05-02 DIAGNOSIS — G4733 Obstructive sleep apnea (adult) (pediatric): Secondary | ICD-10-CM | POA: Diagnosis not present

## 2019-05-04 DIAGNOSIS — I498 Other specified cardiac arrhythmias: Secondary | ICD-10-CM | POA: Diagnosis not present

## 2019-05-12 DIAGNOSIS — D8940 Mast cell activation, unspecified: Secondary | ICD-10-CM | POA: Diagnosis not present

## 2019-05-12 DIAGNOSIS — R69 Illness, unspecified: Secondary | ICD-10-CM | POA: Diagnosis not present

## 2019-05-12 DIAGNOSIS — I498 Other specified cardiac arrhythmias: Secondary | ICD-10-CM | POA: Diagnosis not present

## 2019-05-12 DIAGNOSIS — K581 Irritable bowel syndrome with constipation: Secondary | ICD-10-CM | POA: Diagnosis not present

## 2019-05-12 DIAGNOSIS — M797 Fibromyalgia: Secondary | ICD-10-CM | POA: Diagnosis not present

## 2019-05-12 DIAGNOSIS — I10 Essential (primary) hypertension: Secondary | ICD-10-CM | POA: Diagnosis not present

## 2019-05-12 DIAGNOSIS — I951 Orthostatic hypotension: Secondary | ICD-10-CM | POA: Diagnosis not present

## 2019-05-12 DIAGNOSIS — B370 Candidal stomatitis: Secondary | ICD-10-CM | POA: Diagnosis not present

## 2019-05-12 DIAGNOSIS — R5383 Other fatigue: Secondary | ICD-10-CM | POA: Diagnosis not present

## 2019-05-12 DIAGNOSIS — G909 Disorder of the autonomic nervous system, unspecified: Secondary | ICD-10-CM | POA: Diagnosis not present

## 2019-05-12 DIAGNOSIS — E039 Hypothyroidism, unspecified: Secondary | ICD-10-CM | POA: Diagnosis not present

## 2019-05-13 ENCOUNTER — Encounter: Payer: Medicare HMO | Admitting: Obstetrics & Gynecology

## 2019-05-13 DIAGNOSIS — N390 Urinary tract infection, site not specified: Secondary | ICD-10-CM | POA: Diagnosis not present

## 2019-05-13 DIAGNOSIS — R350 Frequency of micturition: Secondary | ICD-10-CM | POA: Diagnosis not present

## 2019-05-13 DIAGNOSIS — R319 Hematuria, unspecified: Secondary | ICD-10-CM | POA: Diagnosis not present

## 2019-05-16 DIAGNOSIS — I498 Other specified cardiac arrhythmias: Secondary | ICD-10-CM | POA: Diagnosis not present

## 2019-05-19 DIAGNOSIS — M79674 Pain in right toe(s): Secondary | ICD-10-CM | POA: Diagnosis not present

## 2019-05-25 DIAGNOSIS — I498 Other specified cardiac arrhythmias: Secondary | ICD-10-CM | POA: Diagnosis not present

## 2019-06-02 DIAGNOSIS — G4733 Obstructive sleep apnea (adult) (pediatric): Secondary | ICD-10-CM | POA: Diagnosis not present

## 2019-06-02 DIAGNOSIS — I498 Other specified cardiac arrhythmias: Secondary | ICD-10-CM | POA: Diagnosis not present

## 2019-07-03 DIAGNOSIS — G4733 Obstructive sleep apnea (adult) (pediatric): Secondary | ICD-10-CM | POA: Diagnosis not present

## 2019-07-04 DIAGNOSIS — I498 Other specified cardiac arrhythmias: Secondary | ICD-10-CM | POA: Diagnosis not present

## 2019-07-12 DIAGNOSIS — I498 Other specified cardiac arrhythmias: Secondary | ICD-10-CM | POA: Diagnosis not present

## 2019-07-18 ENCOUNTER — Other Ambulatory Visit: Payer: Self-pay

## 2019-07-19 ENCOUNTER — Encounter: Payer: Medicare HMO | Admitting: Obstetrics & Gynecology

## 2019-07-19 DIAGNOSIS — Z20822 Contact with and (suspected) exposure to covid-19: Secondary | ICD-10-CM | POA: Diagnosis not present

## 2019-07-19 DIAGNOSIS — J0110 Acute frontal sinusitis, unspecified: Secondary | ICD-10-CM | POA: Diagnosis not present

## 2019-07-21 ENCOUNTER — Emergency Department (HOSPITAL_BASED_OUTPATIENT_CLINIC_OR_DEPARTMENT_OTHER)
Admission: EM | Admit: 2019-07-21 | Discharge: 2019-07-21 | Disposition: A | Discharge status: Home / Self Care | Payer: Medicare HMO | Attending: Emergency Medicine | Admitting: Emergency Medicine

## 2019-07-21 ENCOUNTER — Other Ambulatory Visit: Payer: Self-pay

## 2019-07-21 ENCOUNTER — Encounter (HOSPITAL_BASED_OUTPATIENT_CLINIC_OR_DEPARTMENT_OTHER): Payer: Self-pay | Admitting: Emergency Medicine

## 2019-07-21 DIAGNOSIS — I10 Essential (primary) hypertension: Secondary | ICD-10-CM | POA: Insufficient documentation

## 2019-07-21 DIAGNOSIS — Z20822 Contact with and (suspected) exposure to covid-19: Secondary | ICD-10-CM | POA: Diagnosis not present

## 2019-07-21 DIAGNOSIS — Z882 Allergy status to sulfonamides status: Secondary | ICD-10-CM | POA: Insufficient documentation

## 2019-07-21 DIAGNOSIS — G901 Familial dysautonomia [Riley-Day]: Secondary | ICD-10-CM | POA: Diagnosis not present

## 2019-07-21 DIAGNOSIS — R0602 Shortness of breath: Secondary | ICD-10-CM | POA: Diagnosis not present

## 2019-07-21 DIAGNOSIS — Z881 Allergy status to other antibiotic agents status: Secondary | ICD-10-CM | POA: Diagnosis not present

## 2019-07-21 DIAGNOSIS — Z79899 Other long term (current) drug therapy: Secondary | ICD-10-CM | POA: Insufficient documentation

## 2019-07-21 DIAGNOSIS — J069 Acute upper respiratory infection, unspecified: Secondary | ICD-10-CM | POA: Insufficient documentation

## 2019-07-21 DIAGNOSIS — Z888 Allergy status to other drugs, medicaments and biological substances status: Secondary | ICD-10-CM | POA: Diagnosis not present

## 2019-07-21 DIAGNOSIS — R531 Weakness: Secondary | ICD-10-CM | POA: Diagnosis not present

## 2019-07-21 HISTORY — DX: Familial dysautonomia (riley-day): G90.1

## 2019-07-21 LAB — CBC
HCT: 38.8 % (ref 36.0–46.0)
Hemoglobin: 13 g/dL (ref 12.0–15.0)
MCH: 33.2 pg (ref 26.0–34.0)
MCHC: 33.5 g/dL (ref 30.0–36.0)
MCV: 99 fL (ref 80.0–100.0)
Platelets: 244 10*3/uL (ref 150–400)
RBC: 3.92 MIL/uL (ref 3.87–5.11)
RDW: 12 % (ref 11.5–15.5)
WBC: 6.3 10*3/uL (ref 4.0–10.5)
nRBC: 0 % (ref 0.0–0.2)

## 2019-07-21 LAB — BASIC METABOLIC PANEL
Anion gap: 10 (ref 5–15)
BUN: 11 mg/dL (ref 8–23)
CO2: 24 mmol/L (ref 22–32)
Calcium: 9.3 mg/dL (ref 8.9–10.3)
Chloride: 102 mmol/L (ref 98–111)
Creatinine, Ser: 0.74 mg/dL (ref 0.44–1.00)
GFR calc Af Amer: >60 mL/min (ref >60)
GFR calc non Af Amer: >60 mL/min (ref >60)
Glucose, Bld: 105 mg/dL — ABNORMAL HIGH (ref 70–99)
Potassium: 3.4 mmol/L — ABNORMAL LOW (ref 3.5–5.1)
Sodium: 136 mmol/L (ref 135–145)

## 2019-07-21 MED ORDER — LACTATED RINGERS IV BOLUS
1000.0000 mL | Freq: Once | INTRAVENOUS | Status: AC
  Administered 2019-07-21: 1000 mL via INTRAVENOUS

## 2019-07-21 NOTE — ED Notes (Signed)
Pt called out to report that she is now having mid-sternal CP that radiates to the back. Pt was described as dull. EDP notified.

## 2019-07-21 NOTE — Discharge Instructions (Signed)
You have a Covid PCR test pending.  Keep yourself isolated until the results of that come back.  Follow-up with your doctor tomorrow as planned.

## 2019-07-21 NOTE — ED Triage Notes (Signed)
Pt reports shortness of breath and generalized weakness. Had a negative COVID test on Tuesday at Urgent Care. States wasn't feeling good over the weekend, reports POTS "attack" on Monday. States diagnosed with sinus infection on Tuesday and started Augmentin. Reports went out running errands and got weak and tired. States BP has been high at home today.

## 2019-07-21 NOTE — ED Provider Notes (Addendum)
[facility] EMERGENCY DEPARTMENT Provider Note   CSN: [id number] Arrival date & time: 07/21/19  2119     History Chief Complaint  Patient presents with  . Shortness of Breath  . Hypertension    Katie [name] is a 64 y.o. female.  HPI Patient presents with shortness of breath weakness and head congestion.  Has been feeling bad for about 5 days now.  States she had POTS attack on Monday.  Yesterday got IV fluids.  States she got started on Augmentin on Tuesday for sinus infection.  Has had congestion.  History of dysautonomia.  States blood pressure is high today.  States she had been active but thinks she may have overdone it.  Feels more fatigued.  Also dull chest pain at times.  States her head is been draining down her throat.  States sometimes her electrolytes will get off.  States that her blood pressure will go up when she feels bad and her blood pressure is high today.  Had a negative Covid test on Tuesday, however it appears to been an antigen test.  States also she had antibody testing done recently and was negative.  She has not had any Covid immunizations.  She has not had fevers.    Past Medical History:  Diagnosis Date  . Anxiety   . Diverticulitis   . Dysautonomia ([place])   . Fibromyalgia   . Hypertension   . Lyme disease   . Mast cell activation syndrome ([place])   . Osteopenia 05/2017   T score -2.2 FRAX 11% / 1.7%  . POTS (postural orthostatic tachycardia syndrome)     Patient Active Problem List   Diagnosis Date Noted  . Diarrhea 11/14/2015  . Acute gastroenteritis 11/14/2015  . Dehydration   . Enteritis 11/13/2015  . Hyponatremia 11/13/2015  . Elevated blood pressure reading 07/14/2013  . Lyme disease 10/25/2012  . Chronic fatigue syndrome 10/25/2012  . Anxiety and depression 10/25/2012  . Fibromyalgia 10/25/2012  . Hormone replacement therapy - followed by Dr. Lonia Skinner in gyn and by [facility] 10/25/2012    Past Surgical  History:  Procedure Laterality Date  . Cataract Removal With Lens Implant Placement Left 03-06-2015  . CERVICAL BIOPSY  W/ LOOP ELECTRODE EXCISION  04/19/2004   HGSIL CIN II  MARGINS NEGATIVE  . COLPOSCOPY  02/19/2004   HGSIL CIN II     OB History    Gravida  2   Para  2   Term      Preterm      AB      Living  2     SAB      TAB      Ectopic      Multiple      Live Births              Family History  Problem Relation Age of Onset  . Heart disease Mother        CHF  . Hypertension Mother   . Cancer Father 69       lung cancer  . Hypertension Father   . Hypertension Paternal Grandmother   . Stroke Paternal Grandmother   . Hypertension Paternal Grandfather   . Mental retardation Son     Social History   Tobacco Use  . Smoking status: Never Smoker  . Smokeless tobacco: Never Used  Substance Use Topics  . Alcohol use: Yes    Comment: occ  . Drug use: No  Home Medications Prior to Admission medications   Medication Sig Start Date End Date Taking? Authorizing Provider  amoxicillin-clavulanate (AUGMENTIN) 875-125 MG tablet Take 1 tablet by mouth 2 (two) times daily. One po bid x 7 days 12/04/18   Mesner, Corene Cornea, MD  cetirizine (ZYRTEC ALLERGY) 10 MG tablet Take 10 mg by mouth daily.    [provider]  Cholecalciferol (VITAMIN D-3) 5000 UNITS TABS Take 5,000 tablets by mouth daily.    [provider]  Coenzyme Q10 (CO Q10 PO) Take 400 mg by mouth daily.    [provider]  Ketotifen Fumarate POWD Take 0.25 mg by mouth.     [provider]  METHYLCOBALAMIN PO Take by mouth.    [provider]  montelukast (SINGULAIR) 10 MG tablet Take 10 mg by mouth at bedtime.    [provider]  Omega-3 Fatty Acids (FISH OIL PO) Take 2 capsules by mouth 2 (two) times daily. BARLEANS FISH OIL    [provider]  ondansetron (ZOFRAN ODT) 4 MG disintegrating tablet Take 1 tablet (4 mg total) by mouth every 8  (eight) hours as needed for nausea or vomiting. 4mg  ODT q4 hours prn nausea/vomit 12/04/18   Mesner, Corene Cornea, MD  pantoprazole (PROTONIX) 40 MG tablet Take 40 mg by mouth daily.    [provider]  Probiotic Product (PROBIOTIC ADVANCED PO) Take 1 capsule by mouth 2 (two) times daily. 100 BILLION    [provider]  QUERCETIN PO Take by mouth.    [provider]  UNABLE TO FIND biestro- care 2 pumps daily it varies progestacare - 3 pumps daily it varies    [provider]    Allergies    Isovue [iopamidol], Azithromycin, Ciprofloxacin, Flagyl [metronidazole], Prednisone, Sulfa antibiotics, Sulfacetamide sodium, and Wheat bran  Review of Systems   Review of Systems  Constitutional: Positive for appetite change and fatigue.  HENT: Positive for congestion.   Respiratory: Positive for shortness of breath.   Cardiovascular: Positive for chest pain.  Gastrointestinal: Negative for abdominal pain, nausea and vomiting.  Genitourinary: Negative for flank pain.  Musculoskeletal: Negative for back pain.  Skin: Negative for rash.  Neurological: Positive for weakness.  Psychiatric/Behavioral: Negative for confusion.    Physical Exam Updated Vital Signs BP (!) 161/99   Pulse 64   Temp 97.9 F (36.6 C) (Oral)   Resp 14   Wt 76.2 kg   LMP 03/15/2013   SpO2 95%   BMI 26.30 kg/m   Physical Exam Vitals and nursing note reviewed.  Constitutional:      Appearance: She is well-developed.  HENT:     Head: Atraumatic.  Cardiovascular:     Rate and Rhythm: Normal rate and regular rhythm.  Pulmonary:     Breath sounds: Normal breath sounds.  Chest:     Chest wall: No tenderness or crepitus.  Musculoskeletal:     Right lower leg: No edema.     Left lower leg: No edema.  Skin:    General: Skin is warm.     Capillary Refill: Capillary refill takes less than 2 seconds.  Neurological:     Mental Status: She is alert and oriented to person, place, and time.      ED Results / Procedures / Treatments   Labs (all labs ordered are listed, but only abnormal results are displayed) Labs Reviewed  BASIC METABOLIC PANEL - Abnormal; Notable for the following components:      Result Value   Potassium  3.4 (*)    Glucose, Bld 105 (*)    All other components within normal limits  SARS CORONAVIRUS 2 (TAT 6-24 HRS)  CBC    EKG None  Radiology No results found.  Procedures Procedures (including critical care time)  Medications Ordered in ED Medications  lactated ringers bolus 1,000 mL (1,000 mLs Intravenous New Bag/Given 07/21/19 2217)    ED Course  I have reviewed the triage vital signs and the nursing notes.  Pertinent labs & imaging results that were available during my care of the patient were reviewed by me and considered in my medical decision making (see chart for details).    MDM Rules/Calculators/A&P                      Patient with nasal congestion.  On antibiotics for sinusitis.  Also history of pots and dysautonomia.  Negative antigen testing but will get PCR test for Covid.  Will give fluid bolus and check basic lab work.    Basic blood work done and reassuring.  Mild hypokalemia.  Patient is feeling better after IV fluids.  Blood pressure coming down.  Will discharge home.  Patient has Zoom appointment with her doctor tomorrow.  Instructed to isolate until PCR test comes back negative for Covid Final Clinical Impression(s) / ED Diagnoses Final diagnoses:  Upper respiratory tract infection, unspecified type  Dysautonomia [facility])    Rx / DC Orders ED Discharge Orders    None       Davonna Belling, MD 07/21/19 2241    Davonna Belling, MD 07/21/19 2324

## 2019-07-22 DIAGNOSIS — E039 Hypothyroidism, unspecified: Secondary | ICD-10-CM | POA: Diagnosis not present

## 2019-07-22 DIAGNOSIS — M797 Fibromyalgia: Secondary | ICD-10-CM | POA: Diagnosis not present

## 2019-07-22 DIAGNOSIS — I951 Orthostatic hypotension: Secondary | ICD-10-CM | POA: Diagnosis not present

## 2019-07-22 DIAGNOSIS — K581 Irritable bowel syndrome with constipation: Secondary | ICD-10-CM | POA: Diagnosis not present

## 2019-07-22 DIAGNOSIS — B370 Candidal stomatitis: Secondary | ICD-10-CM | POA: Diagnosis not present

## 2019-07-22 DIAGNOSIS — D8940 Mast cell activation, unspecified: Secondary | ICD-10-CM | POA: Diagnosis not present

## 2019-07-22 DIAGNOSIS — I10 Essential (primary) hypertension: Secondary | ICD-10-CM | POA: Diagnosis not present

## 2019-07-22 DIAGNOSIS — G909 Disorder of the autonomic nervous system, unspecified: Secondary | ICD-10-CM | POA: Diagnosis not present

## 2019-07-22 DIAGNOSIS — R5383 Other fatigue: Secondary | ICD-10-CM | POA: Diagnosis not present

## 2019-07-22 DIAGNOSIS — R69 Illness, unspecified: Secondary | ICD-10-CM | POA: Diagnosis not present

## 2019-07-22 LAB — SARS CORONAVIRUS 2 (TAT 6-24 HRS): SARS Coronavirus 2: NEGATIVE

## 2019-07-26 DIAGNOSIS — I498 Other specified cardiac arrhythmias: Secondary | ICD-10-CM | POA: Diagnosis not present

## 2019-07-31 DIAGNOSIS — G4733 Obstructive sleep apnea (adult) (pediatric): Secondary | ICD-10-CM | POA: Diagnosis not present

## 2019-08-04 DIAGNOSIS — I498 Other specified cardiac arrhythmias: Secondary | ICD-10-CM | POA: Diagnosis not present

## 2019-08-11 DIAGNOSIS — I498 Other specified cardiac arrhythmias: Secondary | ICD-10-CM | POA: Diagnosis not present

## 2019-08-18 DIAGNOSIS — I498 Other specified cardiac arrhythmias: Secondary | ICD-10-CM | POA: Diagnosis not present

## 2019-08-22 ENCOUNTER — Other Ambulatory Visit: Payer: Self-pay

## 2019-08-22 ENCOUNTER — Emergency Department (HOSPITAL_BASED_OUTPATIENT_CLINIC_OR_DEPARTMENT_OTHER)
Admission: EM | Admit: 2019-08-22 | Discharge: 2019-08-22 | Disposition: A | Discharge status: Home / Self Care | Payer: Medicare HMO | Attending: Emergency Medicine | Admitting: Emergency Medicine

## 2019-08-22 ENCOUNTER — Encounter (HOSPITAL_BASED_OUTPATIENT_CLINIC_OR_DEPARTMENT_OTHER): Payer: Self-pay | Admitting: *Deleted

## 2019-08-22 DIAGNOSIS — R0981 Nasal congestion: Secondary | ICD-10-CM | POA: Diagnosis not present

## 2019-08-22 DIAGNOSIS — I10 Essential (primary) hypertension: Secondary | ICD-10-CM | POA: Insufficient documentation

## 2019-08-22 DIAGNOSIS — B349 Viral infection, unspecified: Secondary | ICD-10-CM

## 2019-08-22 DIAGNOSIS — Z20822 Contact with and (suspected) exposure to covid-19: Secondary | ICD-10-CM | POA: Diagnosis not present

## 2019-08-22 DIAGNOSIS — J3489 Other specified disorders of nose and nasal sinuses: Secondary | ICD-10-CM

## 2019-08-22 DIAGNOSIS — J0110 Acute frontal sinusitis, unspecified: Secondary | ICD-10-CM | POA: Diagnosis not present

## 2019-08-22 DIAGNOSIS — Z79899 Other long term (current) drug therapy: Secondary | ICD-10-CM | POA: Insufficient documentation

## 2019-08-22 DIAGNOSIS — T701XXA Sinus barotrauma, initial encounter: Secondary | ICD-10-CM | POA: Diagnosis present

## 2019-08-22 DIAGNOSIS — J0100 Acute maxillary sinusitis, unspecified: Secondary | ICD-10-CM | POA: Diagnosis not present

## 2019-08-22 LAB — CBC WITH DIFFERENTIAL/PLATELET
Abs Immature Granulocytes: 0.02 10*3/uL (ref 0.00–0.07)
Basophils Absolute: 0.1 10*3/uL (ref 0.0–0.1)
Basophils Relative: 1 %
Eosinophils Absolute: 0.2 10*3/uL (ref 0.0–0.5)
Eosinophils Relative: 3 %
HCT: 43 % (ref 36.0–46.0)
Hemoglobin: 14 g/dL (ref 12.0–15.0)
Immature Granulocytes: 0 %
Lymphocytes Relative: 14 %
Lymphs Abs: 0.9 10*3/uL (ref 0.7–4.0)
MCH: 32.5 pg (ref 26.0–34.0)
MCHC: 32.6 g/dL (ref 30.0–36.0)
MCV: 99.8 fL (ref 80.0–100.0)
Monocytes Absolute: 0.6 10*3/uL (ref 0.1–1.0)
Monocytes Relative: 9 %
Neutro Abs: 4.9 10*3/uL (ref 1.7–7.7)
Neutrophils Relative %: 73 %
Platelets: 237 10*3/uL (ref 150–400)
RBC: 4.31 MIL/uL (ref 3.87–5.11)
RDW: 11.8 % (ref 11.5–15.5)
WBC: 6.7 10*3/uL (ref 4.0–10.5)
nRBC: 0 % (ref 0.0–0.2)

## 2019-08-22 LAB — BASIC METABOLIC PANEL
Anion gap: 11 (ref 5–15)
BUN: 9 mg/dL (ref 8–23)
CO2: 27 mmol/L (ref 22–32)
Calcium: 9.5 mg/dL (ref 8.9–10.3)
Chloride: 99 mmol/L (ref 98–111)
Creatinine, Ser: 0.71 mg/dL (ref 0.44–1.00)
GFR calc Af Amer: >60 mL/min (ref >60)
GFR calc non Af Amer: >60 mL/min (ref >60)
Glucose, Bld: 111 mg/dL — ABNORMAL HIGH (ref 70–99)
Potassium: 3.8 mmol/L (ref 3.5–5.1)
Sodium: 137 mmol/L (ref 135–145)

## 2019-08-22 MED ORDER — ACETAMINOPHEN 325 MG PO TABS
650.0000 mg | ORAL_TABLET | Freq: Once | ORAL | Status: AC
  Administered 2019-08-22: 650 mg via ORAL
  Filled 2019-08-22: qty 2

## 2019-08-22 MED ORDER — SODIUM CHLORIDE 0.9 % IV BOLUS
1000.0000 mL | Freq: Once | INTRAVENOUS | Status: AC
  Administered 2019-08-22: 1000 mL via INTRAVENOUS

## 2019-08-22 NOTE — ED Notes (Signed)
Pt ambulated to bathroom without difficulty.

## 2019-08-22 NOTE — Discharge Instructions (Signed)
General Viral Syndrome Care Instructions:  Your symptoms are likely consistent with a viral illness. Viruses do not require or respond to antibiotics. Treatment is symptomatic care and it is important to note that these symptoms may last for 7-14 days.   Hand washing: Wash your hands throughout the day, but especially before and after touching the face, using the restroom, sneezing, coughing, or touching surfaces that have been coughed or sneezed upon. Hydration: Symptoms of most illnesses will be intensified and complicated by dehydration. Dehydration can also extend the duration of symptoms. Drink plenty of fluids and get plenty of rest. You should be drinking at least half a liter of water an hour to stay hydrated. Electrolyte drinks (ex. Gatorade, Powerade, Pedialyte) are also encouraged. You should be drinking enough fluids to make your urine light yellow, almost clear. If this is not the case, you are not drinking enough water. Please note that some of the treatments indicated below will not be effective if you are not adequately hydrated. Diet: Please concentrate on hydration, however, you may introduce food slowly.  Start with a clear liquid diet, progressed to a full liquid diet, and then bland solids as you are able. Pain or fever: acetaminophen (generic for Tylenol) for pain or fever.  Acetaminophen: May take acetaminophen (generic for Tylenol), as needed, for pain. Your daily total maximum amount of acetaminophen from all sources should be limited to 4000mg /day for persons without liver problems, or 2000mg /day for those with liver problems. Zyrtec or Claritin: May add these medication daily to control underlying symptoms of congestion, sneezing, and other signs of allergies.  These medications are available over-the-counter. Generics: Cetirizine (generic for Zyrtec) and loratadine (generic for Claritin). Fluticasone: Use fluticasone (generic for Flonase), as directed, for nasal and sinus  congestion.  This medication is available over-the-counter. Congestion: Plain guaifenesin (generic for plain Mucinex) may help relieve congestion. Saline sinus rinses and saline nasal sprays may also help relieve congestion.  Sore throat: Warm liquids or Chloraseptic spray may help soothe a sore throat. Gargle twice a day with a salt water solution made from a half teaspoon of salt in a cup of warm water.  Follow up: Follow up with a primary care provider within the next two weeks should symptoms fail to resolve. Return: Return to the ED for significantly worsening symptoms, shortness of breath, persistent vomiting, large amounts of blood in stool, or any other major concerns.  For prescription assistance, may try using prescription discount sites or apps, such as goodrx.com

## 2019-08-22 NOTE — ED Provider Notes (Signed)
[facility] EMERGENCY DEPARTMENT Provider Note   CSN: [id number] Arrival date & time: 08/22/19  1801     History Chief Complaint  Patient presents with  . URI    Katie [name] is a 64 y.o. female.  HPI      Katie [name] is a 64 y.o. female, with a history of anxiety, dysautonomia, fibromyalgia, HTN, POTS, mast cell activation syndrome, presenting to the ED with nasal congestion, sneezing, sinus pressure, and fatigue for about the last 3 days. She has been taking Augmentin twice a day for the last 2 days, which is left over from a previous prescription.  She has also tried a dose of nasal decongestant, which she states "just dried the nasal passages" and Mucinex D. She endorses subjective fever stating, "my typical temperature is around 96.5 and lately it has been above 98." Patient was seen at urgent care earlier today, had negative rapid Covid, prescribed continued prescription for Augmentin as well as Norel AD.  She states she is limited on the medication she can take, both antibiotics and medications meant for symptomatic care, due to her allergy list.  She also adds her blood pressure has been higher than normal the last couple days and this will occur when she has an infection or when she is dehydrated.  She receives weekly IV fluid infusions for her dysautonomia. She is requesting IV fluids and lab work.  Denies chest pain, shortness of breath, cough, sore throat, difficulty swallowing, ear pain/drainage, abdominal pain, N/V/D, dizziness, syncope, neuro deficits, vision changes, or any other complaints.  Past Medical History:  Diagnosis Date  . Anxiety   . Diverticulitis   . Dysautonomia ([place])   . Fibromyalgia   . Hypertension   . Lyme disease   . Mast cell activation syndrome ([place])   . Osteopenia 05/2017   T score -2.2 FRAX 11% / 1.7%  . POTS (postural orthostatic tachycardia syndrome)     Patient Active Problem List   Diagnosis Date Noted  . Diarrhea  11/14/2015  . Acute gastroenteritis 11/14/2015  . Dehydration   . Enteritis 11/13/2015  . Hyponatremia 11/13/2015  . Elevated blood pressure reading 07/14/2013  . Lyme disease 10/25/2012  . Chronic fatigue syndrome 10/25/2012  . Anxiety and depression 10/25/2012  . Fibromyalgia 10/25/2012  . Hormone replacement therapy - followed by Dr. Lonia Skinner in gyn and by [facility] 10/25/2012    Past Surgical History:  Procedure Laterality Date  . Cataract Removal With Lens Implant Placement Left 03-06-2015  . CERVICAL BIOPSY  W/ LOOP ELECTRODE EXCISION  04/19/2004   HGSIL CIN II  MARGINS NEGATIVE  . COLPOSCOPY  02/19/2004   HGSIL CIN II     OB History    Gravida  2   Para  2   Term      Preterm      AB      Living  2     SAB      TAB      Ectopic      Multiple      Live Births              Family History  Problem Relation Age of Onset  . Heart disease Mother        CHF  . Hypertension Mother   . Cancer Father 31       lung cancer  . Hypertension Father   . Hypertension Paternal Grandmother   . Stroke  Paternal Grandmother   . Hypertension Paternal Grandfather   . Mental retardation Son     Social History   Tobacco Use  . Smoking status: Never Smoker  . Smokeless tobacco: Never Used  Substance Use Topics  . Alcohol use: Yes    Comment: occ  . Drug use: No    Home Medications Prior to Admission medications   Medication Sig Start Date End Date Taking? Authorizing Provider  amoxicillin-clavulanate (AUGMENTIN) 875-125 MG tablet Take 1 tablet by mouth 2 (two) times daily. One po bid x 7 days 12/04/18   Mesner, Corene Cornea, MD  cetirizine (ZYRTEC ALLERGY) 10 MG tablet Take 10 mg by mouth daily.    [provider]  Cholecalciferol (VITAMIN D-3) 5000 UNITS TABS Take 5,000 tablets by mouth daily.    [provider]  Coenzyme Q10 (CO Q10 PO) Take 400 mg by mouth daily.    [provider]  Ketotifen Fumarate POWD Take  0.25 mg by mouth.     [provider]  METHYLCOBALAMIN PO Take by mouth.    [provider]  montelukast (SINGULAIR) 10 MG tablet Take 10 mg by mouth at bedtime.    [provider]  Omega-3 Fatty Acids (FISH OIL PO) Take 2 capsules by mouth 2 (two) times daily. BARLEANS FISH OIL    [provider]  ondansetron (ZOFRAN ODT) 4 MG disintegrating tablet Take 1 tablet (4 mg total) by mouth every 8 (eight) hours as needed for nausea or vomiting. 4mg  ODT q4 hours prn nausea/vomit 12/04/18   Mesner, Corene Cornea, MD  pantoprazole (PROTONIX) 40 MG tablet Take 40 mg by mouth daily.    [provider]  Probiotic Product (PROBIOTIC ADVANCED PO) Take 1 capsule by mouth 2 (two) times daily. 100 BILLION    [provider]  QUERCETIN PO Take by mouth.    [provider]  UNABLE TO FIND biestro- care 2 pumps daily it varies progestacare - 3 pumps daily it varies    [provider]    Allergies    Epinephrine, Isovue [iopamidol], Azithromycin, Ciprofloxacin, Flagyl [metronidazole], Prednisone, Sulfa antibiotics, Sulfacetamide sodium, and Wheat bran  Review of Systems   Review of Systems  Constitutional: Positive for appetite change and fever (subjective).  HENT: Positive for congestion, rhinorrhea, sinus pressure and sneezing. Negative for ear discharge, ear pain, facial swelling, sore throat, trouble swallowing and voice change.   Eyes: Negative for visual disturbance.  Respiratory: Negative for cough and shortness of breath.   Cardiovascular: Negative for chest pain and leg swelling.  Gastrointestinal: Negative for abdominal pain, diarrhea, nausea and vomiting.  Musculoskeletal: Negative for back pain.  Neurological: Positive for headaches. Negative for dizziness, syncope, weakness and numbness.  All other systems reviewed and are negative.   Physical Exam Updated Vital Signs BP (!) 185/106 (BP Location: Right Arm)   Pulse 96   Temp 98.8  F (37.1 C) (Oral)   Resp 20   Ht 5\' 7"  (1.702 m)   Wt 74.8 kg   LMP 03/15/2013   SpO2 98%   BMI 25.84 kg/m   Physical Exam Vitals and nursing note reviewed.  Constitutional:      General: She is not in acute distress.    Appearance: She is well-developed. She is not diaphoretic.  HENT:     Head: Normocephalic and atraumatic.     Right Ear: Tympanic membrane, ear canal and external ear normal.     Left Ear: Tympanic membrane, ear canal and external ear  normal.     Nose: Mucosal edema and congestion present.     Right Sinus: Maxillary sinus tenderness and frontal sinus tenderness present.     Left Sinus: Maxillary sinus tenderness and frontal sinus tenderness present.     Mouth/Throat:     Mouth: Mucous membranes are moist.     Pharynx: Oropharynx is clear.  Eyes:     Conjunctiva/sclera: Conjunctivae normal.  Cardiovascular:     Rate and Rhythm: Normal rate and regular rhythm.     Pulses: Normal pulses.          Radial pulses are 2+ on the right side and 2+ on the left side.       Posterior tibial pulses are 2+ on the right side and 2+ on the left side.     Heart sounds: Normal heart sounds.     Comments: Tactile temperature in the extremities appropriate and equal bilaterally. Pulmonary:     Effort: Pulmonary effort is normal. No respiratory distress.     Breath sounds: Normal breath sounds.  Abdominal:     Palpations: Abdomen is soft.     Tenderness: There is no abdominal tenderness. There is no guarding.  Musculoskeletal:     Cervical back: Normal range of motion and neck supple. No tenderness.     Right lower leg: No edema.     Left lower leg: No edema.  Lymphadenopathy:     Cervical: No cervical adenopathy.  Skin:    General: Skin is warm and dry.  Neurological:     Mental Status: She is alert and oriented to person, place, and time.     Comments: No noted acute cognitive deficit. Sensation grossly intact to light touch in the extremities.   Grip strengths  equal bilaterally.   Strength 5/5 in all extremities.  No gait disturbance.  Coordination intact.  Cranial nerves III-XII grossly intact.  Handles oral secretions without noted difficulty.  No noted phonation or speech deficit. No facial droop.   Psychiatric:        Mood and Affect: Mood and affect normal.        Speech: Speech normal.        Behavior: Behavior normal.     ED Results / Procedures / Treatments   Labs (all labs ordered are listed, but only abnormal results are displayed) Labs Reviewed  BASIC METABOLIC PANEL - Abnormal; Notable for the following components:      Result Value   Glucose, Bld 111 (*)    All other components within normal limits  SARS CORONAVIRUS 2 (TAT 6-24 HRS)  CBC WITH DIFFERENTIAL/PLATELET    EKG None  Radiology No results found.  Procedures Procedures (including critical care time)  Medications Ordered in ED Medications  acetaminophen (TYLENOL) tablet 650 mg (650 mg Oral Given 08/22/19 1928)  sodium chloride 0.9 % bolus 1,000 mL (0 mLs Intravenous Stopped 08/22/19 2105)    ED Course  I have reviewed the triage vital signs and the nursing notes.  Pertinent labs & imaging results that were available during my care of the patient were reviewed by me and considered in my medical decision making (see chart for details).    MDM Rules/Calculators/A&P                      Patient presents with sinus discomfort, congestion, and subjective fever. Patient is nontoxic appearing, afebrile, not tachycardic, not tachypneic, not hypotensive, maintains excellent SPO2 on room air, and is in no  apparent distress.   I have reviewed the patient's chart to obtain more information.  I reviewed and interpreted the patient's labs. Lab work overall reassuring.  No leukocytosis. Administered IV fluids.  Improvement in patient's blood pressure noted.  I discussed with the patient the possibility for increased blood pressure due to decongestants. My  suspicion for hypertensive emergency is low.  I did discuss, however, the possibility for a headache due to hypertension versus sinus congestion. Patient states she will follow-up with her PCP tomorrow. Statistically, sinus infections are more likely to be viral, however, if patient does have a bacterial sinus infection she is taking an appropriate antibiotic.  The patient was given instructions for home care as well as return precautions. Patient voices understanding of these instructions, accepts the plan, and is comfortable with discharge.  Vitals:   08/22/19 1820 08/22/19 2107 08/22/19 2120  BP: (!) 185/106 (!) 137/93   Pulse: 96 81   Resp: 20 18   Temp: 98.8 F (37.1 C)  98.7 F (37.1 C)  TempSrc: Oral  Oral  SpO2: 98% 98%   Weight: 74.8 kg    Height: 5\' 7"  (1.702 m)      Katie [name] was evaluated in Emergency Department on 08/23/2019 for the symptoms described in the history of present illness. She was evaluated in the context of the global COVID-19 pandemic, which necessitated consideration that the patient might be at risk for infection with the SARS-CoV-2 virus that causes COVID-19. Institutional protocols and algorithms that pertain to the evaluation of patients at risk for COVID-19 are in a state of rapid change based on information released by regulatory bodies including the CDC and federal and state organizations. These policies and algorithms were followed during the patient's care in the ED.  Final Clinical Impression(s) / ED Diagnoses Final diagnoses:  Sinus pressure  Viral syndrome    Rx / DC Orders ED Discharge Orders    None       Layla Maw 08/23/19 [place], [place], DO 08/23/19 2029

## 2019-08-22 NOTE — ED Triage Notes (Signed)
Pt. Reports she was seen at Urgent Care and had flu test that was negative and Covid test negative.  Pt. Reports having sinus pressure and now she is still having terrible fatigue and pressure.  Pt. Reports she is having weekly infusions for and autoimmune disorder she has and last had one on Thursday.  Pt has had 4 doses of Augmentin with last one this morning and today she is still feeling so bad she reports.

## 2019-08-23 LAB — SARS CORONAVIRUS 2 (TAT 6-24 HRS): SARS Coronavirus 2: NEGATIVE

## 2019-08-24 ENCOUNTER — Other Ambulatory Visit: Payer: Self-pay | Admitting: Obstetrics & Gynecology

## 2019-08-24 DIAGNOSIS — I10 Essential (primary) hypertension: Secondary | ICD-10-CM | POA: Diagnosis not present

## 2019-08-24 DIAGNOSIS — J0140 Acute pansinusitis, unspecified: Secondary | ICD-10-CM | POA: Diagnosis not present

## 2019-08-24 DIAGNOSIS — Z1231 Encounter for screening mammogram for malignant neoplasm of breast: Secondary | ICD-10-CM

## 2019-08-25 ENCOUNTER — Encounter (INDEPENDENT_AMBULATORY_CARE_PROVIDER_SITE_OTHER): Payer: Medicare HMO | Admitting: Ophthalmology

## 2019-08-26 DIAGNOSIS — I498 Other specified cardiac arrhythmias: Secondary | ICD-10-CM | POA: Diagnosis not present

## 2019-08-30 ENCOUNTER — Ambulatory Visit: Payer: Medicare HMO

## 2019-08-31 DIAGNOSIS — I498 Other specified cardiac arrhythmias: Secondary | ICD-10-CM | POA: Diagnosis not present

## 2019-08-31 DIAGNOSIS — G4733 Obstructive sleep apnea (adult) (pediatric): Secondary | ICD-10-CM | POA: Diagnosis not present

## 2019-09-02 ENCOUNTER — Encounter: Payer: Medicare HMO | Admitting: Obstetrics & Gynecology

## 2019-09-08 ENCOUNTER — Ambulatory Visit: Payer: Medicare HMO

## 2019-09-12 DIAGNOSIS — I498 Other specified cardiac arrhythmias: Secondary | ICD-10-CM | POA: Diagnosis not present

## 2019-09-21 ENCOUNTER — Encounter (INDEPENDENT_AMBULATORY_CARE_PROVIDER_SITE_OTHER): Payer: Medicare HMO | Admitting: Ophthalmology

## 2019-09-21 DIAGNOSIS — I498 Other specified cardiac arrhythmias: Secondary | ICD-10-CM | POA: Diagnosis not present

## 2019-09-26 DIAGNOSIS — E538 Deficiency of other specified B group vitamins: Secondary | ICD-10-CM | POA: Diagnosis not present

## 2019-09-26 DIAGNOSIS — A6920 Lyme disease, unspecified: Secondary | ICD-10-CM | POA: Diagnosis not present

## 2019-09-26 DIAGNOSIS — R7989 Other specified abnormal findings of blood chemistry: Secondary | ICD-10-CM | POA: Diagnosis not present

## 2019-09-26 DIAGNOSIS — E559 Vitamin D deficiency, unspecified: Secondary | ICD-10-CM | POA: Diagnosis not present

## 2019-09-26 DIAGNOSIS — R5381 Other malaise: Secondary | ICD-10-CM | POA: Diagnosis not present

## 2019-09-26 DIAGNOSIS — R1013 Epigastric pain: Secondary | ICD-10-CM | POA: Diagnosis not present

## 2019-09-26 DIAGNOSIS — R5383 Other fatigue: Secondary | ICD-10-CM | POA: Diagnosis not present

## 2019-09-26 DIAGNOSIS — Z5181 Encounter for therapeutic drug level monitoring: Secondary | ICD-10-CM | POA: Diagnosis not present

## 2019-09-26 DIAGNOSIS — E568 Deficiency of other vitamins: Secondary | ICD-10-CM | POA: Diagnosis not present

## 2019-09-26 DIAGNOSIS — R799 Abnormal finding of blood chemistry, unspecified: Secondary | ICD-10-CM | POA: Diagnosis not present

## 2019-09-26 DIAGNOSIS — R69 Illness, unspecified: Secondary | ICD-10-CM | POA: Diagnosis not present

## 2019-09-26 DIAGNOSIS — Z13811 Encounter for screening for lower gastrointestinal disorder: Secondary | ICD-10-CM | POA: Diagnosis not present

## 2019-09-26 DIAGNOSIS — E54 Ascorbic acid deficiency: Secondary | ICD-10-CM | POA: Diagnosis not present

## 2019-09-27 DIAGNOSIS — R7989 Other specified abnormal findings of blood chemistry: Secondary | ICD-10-CM | POA: Diagnosis not present

## 2019-09-27 DIAGNOSIS — E279 Disorder of adrenal gland, unspecified: Secondary | ICD-10-CM | POA: Diagnosis not present

## 2019-09-27 DIAGNOSIS — E349 Endocrine disorder, unspecified: Secondary | ICD-10-CM | POA: Diagnosis not present

## 2019-09-27 DIAGNOSIS — M545 Low back pain: Secondary | ICD-10-CM | POA: Diagnosis not present

## 2019-09-27 DIAGNOSIS — R5382 Chronic fatigue, unspecified: Secondary | ICD-10-CM | POA: Diagnosis not present

## 2019-09-28 DIAGNOSIS — Z1231 Encounter for screening mammogram for malignant neoplasm of breast: Secondary | ICD-10-CM | POA: Diagnosis not present

## 2019-09-28 DIAGNOSIS — I498 Other specified cardiac arrhythmias: Secondary | ICD-10-CM | POA: Diagnosis not present

## 2019-09-29 ENCOUNTER — Ambulatory Visit: Payer: Medicare HMO

## 2019-09-30 DIAGNOSIS — G4733 Obstructive sleep apnea (adult) (pediatric): Secondary | ICD-10-CM | POA: Diagnosis not present

## 2019-10-04 DIAGNOSIS — R928 Other abnormal and inconclusive findings on diagnostic imaging of breast: Secondary | ICD-10-CM | POA: Diagnosis not present

## 2019-10-04 DIAGNOSIS — R921 Mammographic calcification found on diagnostic imaging of breast: Secondary | ICD-10-CM | POA: Diagnosis not present

## 2019-10-11 ENCOUNTER — Observation Stay
Admission: EM | Admit: 2019-10-11 | Discharge: 2019-10-12 | Disposition: A | Discharge status: Home / Self Care | Payer: Medicare HMO | Attending: Internal Medicine | Admitting: Internal Medicine

## 2019-10-11 ENCOUNTER — Encounter: Payer: Self-pay | Admitting: Emergency Medicine

## 2019-10-11 ENCOUNTER — Other Ambulatory Visit: Payer: Self-pay

## 2019-10-11 ENCOUNTER — Emergency Department: Payer: Medicare HMO

## 2019-10-11 DIAGNOSIS — I10 Essential (primary) hypertension: Secondary | ICD-10-CM | POA: Insufficient documentation

## 2019-10-11 DIAGNOSIS — Z881 Allergy status to other antibiotic agents status: Secondary | ICD-10-CM | POA: Diagnosis not present

## 2019-10-11 DIAGNOSIS — R197 Diarrhea, unspecified: Secondary | ICD-10-CM | POA: Diagnosis not present

## 2019-10-11 DIAGNOSIS — Z882 Allergy status to sulfonamides status: Secondary | ICD-10-CM | POA: Diagnosis not present

## 2019-10-11 DIAGNOSIS — J309 Allergic rhinitis, unspecified: Secondary | ICD-10-CM | POA: Diagnosis not present

## 2019-10-11 DIAGNOSIS — I498 Other specified cardiac arrhythmias: Secondary | ICD-10-CM | POA: Diagnosis not present

## 2019-10-11 DIAGNOSIS — E039 Hypothyroidism, unspecified: Secondary | ICD-10-CM | POA: Insufficient documentation

## 2019-10-11 DIAGNOSIS — G901 Familial dysautonomia [Riley-Day]: Secondary | ICD-10-CM | POA: Diagnosis not present

## 2019-10-11 DIAGNOSIS — K760 Fatty (change of) liver, not elsewhere classified: Secondary | ICD-10-CM | POA: Diagnosis not present

## 2019-10-11 DIAGNOSIS — R0902 Hypoxemia: Secondary | ICD-10-CM | POA: Diagnosis not present

## 2019-10-11 DIAGNOSIS — I700 Atherosclerosis of aorta: Secondary | ICD-10-CM | POA: Diagnosis not present

## 2019-10-11 DIAGNOSIS — R112 Nausea with vomiting, unspecified: Secondary | ICD-10-CM

## 2019-10-11 DIAGNOSIS — F419 Anxiety disorder, unspecified: Secondary | ICD-10-CM

## 2019-10-11 DIAGNOSIS — Z20822 Contact with and (suspected) exposure to covid-19: Secondary | ICD-10-CM | POA: Diagnosis not present

## 2019-10-11 DIAGNOSIS — R69 Illness, unspecified: Secondary | ICD-10-CM | POA: Diagnosis not present

## 2019-10-11 DIAGNOSIS — M545 Low back pain: Secondary | ICD-10-CM | POA: Diagnosis not present

## 2019-10-11 DIAGNOSIS — Z888 Allergy status to other drugs, medicaments and biological substances status: Secondary | ICD-10-CM | POA: Insufficient documentation

## 2019-10-11 DIAGNOSIS — E860 Dehydration: Secondary | ICD-10-CM | POA: Diagnosis not present

## 2019-10-11 DIAGNOSIS — Z79899 Other long term (current) drug therapy: Secondary | ICD-10-CM | POA: Diagnosis not present

## 2019-10-11 DIAGNOSIS — R52 Pain, unspecified: Secondary | ICD-10-CM | POA: Diagnosis not present

## 2019-10-11 DIAGNOSIS — M5489 Other dorsalgia: Secondary | ICD-10-CM | POA: Diagnosis not present

## 2019-10-11 DIAGNOSIS — R1110 Vomiting, unspecified: Secondary | ICD-10-CM | POA: Diagnosis present

## 2019-10-11 DIAGNOSIS — K529 Noninfective gastroenteritis and colitis, unspecified: Principal | ICD-10-CM | POA: Diagnosis present

## 2019-10-11 DIAGNOSIS — K219 Gastro-esophageal reflux disease without esophagitis: Secondary | ICD-10-CM | POA: Insufficient documentation

## 2019-10-11 LAB — CBC
HCT: 41.7 % (ref 36.0–46.0)
Hemoglobin: 14.1 g/dL (ref 12.0–15.0)
MCH: 33 pg (ref 26.0–34.0)
MCHC: 33.8 g/dL (ref 30.0–36.0)
MCV: 97.7 fL (ref 80.0–100.0)
Platelets: 217 10*3/uL (ref 150–400)
RBC: 4.27 MIL/uL (ref 3.87–5.11)
RDW: 12.3 % (ref 11.5–15.5)
WBC: 3.2 10*3/uL — ABNORMAL LOW (ref 4.0–10.5)
nRBC: 0 % (ref 0.0–0.2)

## 2019-10-11 LAB — COMPREHENSIVE METABOLIC PANEL
ALT: 24 U/L (ref 0–44)
AST: 29 U/L (ref 15–41)
Albumin: 4.3 g/dL (ref 3.5–5.0)
Alkaline Phosphatase: 98 U/L (ref 38–126)
Anion gap: 12 (ref 5–15)
BUN: 11 mg/dL (ref 8–23)
CO2: 25 mmol/L (ref 22–32)
Calcium: 9.2 mg/dL (ref 8.9–10.3)
Chloride: 104 mmol/L (ref 98–111)
Creatinine, Ser: 0.85 mg/dL (ref 0.44–1.00)
GFR calc Af Amer: >60 mL/min (ref >60)
GFR calc non Af Amer: >60 mL/min (ref >60)
Glucose, Bld: 109 mg/dL — ABNORMAL HIGH (ref 70–99)
Potassium: 3.4 mmol/L — ABNORMAL LOW (ref 3.5–5.1)
Sodium: 141 mmol/L (ref 135–145)
Total Bilirubin: 1.5 mg/dL — ABNORMAL HIGH (ref 0.3–1.2)
Total Protein: 7.9 g/dL (ref 6.5–8.1)

## 2019-10-11 LAB — GASTROINTESTINAL PANEL BY PCR, STOOL (REPLACES STOOL CULTURE)

## 2019-10-11 LAB — TSH: TSH: 1.119 u[IU]/mL (ref 0.350–4.500)

## 2019-10-11 LAB — C DIFFICILE QUICK SCREEN W PCR REFLEX
C Diff antigen: NEGATIVE
C Diff interpretation: NOT DETECTED
C Diff toxin: NEGATIVE

## 2019-10-11 LAB — LIPASE, BLOOD: Lipase: 25 U/L (ref 11–51)

## 2019-10-11 LAB — SARS CORONAVIRUS 2 BY RT PCR (HOSPITAL ORDER, PERFORMED IN ~~LOC~~ HOSPITAL LAB): SARS Coronavirus 2: NEGATIVE

## 2019-10-11 MED ORDER — SODIUM CHLORIDE 0.9 % IV SOLN: INTRAVENOUS | Status: DC

## 2019-10-11 MED ORDER — PANTOPRAZOLE SODIUM 40 MG IV SOLR
40.0000 mg | Freq: Two times a day (BID) | INTRAVENOUS | Status: DC
  Administered 2019-10-12: 40 mg via INTRAVENOUS
  Filled 2019-10-11: qty 40

## 2019-10-11 MED ORDER — ONDANSETRON HCL 4 MG/2ML IJ SOLN
4.0000 mg | Freq: Once | INTRAMUSCULAR | Status: AC | PRN
  Administered 2019-10-11: 4 mg via INTRAVENOUS
  Filled 2019-10-11: qty 2

## 2019-10-11 MED ORDER — ACETAMINOPHEN 650 MG RE SUPP: 650.0000 mg | Freq: Four times a day (QID) | RECTAL | Status: DC | PRN

## 2019-10-11 MED ORDER — ONDANSETRON 4 MG PO TBDP: 4.0000 mg | ORAL_TABLET | Freq: Three times a day (TID) | ORAL | 0 refills | Status: AC | PRN

## 2019-10-11 MED ORDER — PANTOPRAZOLE SODIUM 40 MG IV SOLR
40.0000 mg | Freq: Once | INTRAVENOUS | Status: AC
  Administered 2019-10-11: 40 mg via INTRAVENOUS
  Filled 2019-10-11: qty 40

## 2019-10-11 MED ORDER — LOPERAMIDE HCL 2 MG PO CAPS
4.0000 mg | ORAL_CAPSULE | Freq: Once | ORAL | Status: AC
  Administered 2019-10-11: 4 mg via ORAL
  Filled 2019-10-11: qty 2

## 2019-10-11 MED ORDER — VITAMIN D 25 MCG (1000 UNIT) PO TABS
5000.0000 [IU] | ORAL_TABLET | Freq: Every day | ORAL | Status: DC
  Filled 2019-10-11: qty 5

## 2019-10-11 MED ORDER — LORATADINE 10 MG PO TABS
10.0000 mg | ORAL_TABLET | Freq: Every day | ORAL | Status: DC
  Filled 2019-10-11: qty 1

## 2019-10-11 MED ORDER — POTASSIUM CHLORIDE 20 MEQ PO PACK
40.0000 meq | PACK | Freq: Once | ORAL | Status: AC
  Administered 2019-10-11: 40 meq via ORAL
  Filled 2019-10-11: qty 2

## 2019-10-11 MED ORDER — OMEGA-3-ACID ETHYL ESTERS 1 G PO CAPS
1.0000 g | ORAL_CAPSULE | Freq: Two times a day (BID) | ORAL | Status: DC
  Filled 2019-10-11 (×2): qty 1

## 2019-10-11 MED ORDER — ONDANSETRON HCL 4 MG/2ML IJ SOLN
4.0000 mg | Freq: Four times a day (QID) | INTRAMUSCULAR | Status: DC | PRN
  Administered 2019-10-11 – 2019-10-12 (×2): 4 mg via INTRAVENOUS
  Filled 2019-10-11 (×3): qty 2

## 2019-10-11 MED ORDER — LORAZEPAM 2 MG/ML IJ SOLN
0.5000 mg | Freq: Once | INTRAMUSCULAR | Status: AC
  Administered 2019-10-11: 0.5 mg via INTRAVENOUS
  Filled 2019-10-11: qty 1

## 2019-10-11 MED ORDER — ENOXAPARIN SODIUM 40 MG/0.4ML ~~LOC~~ SOLN
40.0000 mg | SUBCUTANEOUS | Status: DC
  Filled 2019-10-11: qty 0.4

## 2019-10-11 MED ORDER — RISAQUAD PO CAPS
ORAL_CAPSULE | Freq: Two times a day (BID) | ORAL | Status: DC
  Filled 2019-10-11 (×2): qty 1

## 2019-10-11 MED ORDER — SODIUM CHLORIDE 0.9 % IV BOLUS
1000.0000 mL | Freq: Once | INTRAVENOUS | Status: AC
  Administered 2019-10-11: 1000 mL via INTRAVENOUS

## 2019-10-11 MED ORDER — ONDANSETRON HCL 4 MG PO TABS: 4.0000 mg | ORAL_TABLET | Freq: Four times a day (QID) | ORAL | Status: DC | PRN

## 2019-10-11 MED ORDER — TRAZODONE HCL 50 MG PO TABS
25.0000 mg | ORAL_TABLET | Freq: Every evening | ORAL | Status: DC | PRN
  Administered 2019-10-12: 25 mg via ORAL
  Filled 2019-10-11: qty 1

## 2019-10-11 MED ORDER — ACETAMINOPHEN 325 MG PO TABS: 650.0000 mg | ORAL_TABLET | Freq: Four times a day (QID) | ORAL | Status: DC | PRN

## 2019-10-11 MED ORDER — MONTELUKAST SODIUM 10 MG PO TABS
10.0000 mg | ORAL_TABLET | Freq: Every day | ORAL | Status: DC
  Filled 2019-10-11: qty 1

## 2019-10-11 NOTE — ED Triage Notes (Signed)
First nurse note- here for NVD, also having lower back pain.  VSS with EMS. CBG 136.

## 2019-10-11 NOTE — H&P (Signed)
[facility] NAME: Katie [name]    MR#:  [id number]  DATE OF BIRTH:  [birth year]  DATE OF ADMISSION:  10/11/2019  PRIMARY CARE PHYSICIAN: Robert Bellow, PA-C   REQUESTING/REFERRING PHYSICIAN: Marjean Donna, MD  CHIEF COMPLAINT:   Chief Complaint  Patient presents with  . Emesis  . Diarrhea    HISTORY OF PRESENT ILLNESS:  Katie [name]  is a 64 y.o. Caucasian female with a known history of POTS, dysautonomia, hypertension, Lyme disease, fibromyalgia and anxiety, who presented to the emergency room with acute onset of recurrent nausea and vomiting with diarrhea since 2 PM today.  The patient was going to dental appointment and due to significant symptoms decided to drive back from [facility] to [facility].  She had to pull over and call an ambulance due to severity of her symptoms.  She denies any melena or bright red bleeding per rectum.  Her diarrhea has been loose and later watery.  No bloody vomitus or hematemesis.  She denies any significant abdominal pain.  No chest pain or dyspnea or cough or wheezing.  No other bleeding diathesis.  Upon presentation to the emergency room, blood pressure was 185/106 with otherwise normal vital signs and later blood pressure was 137/93.  Labs revealed mild hypokalemia of 3.4 and lipase level of 25 with total bili 1.5 and otherwise normal LFTs.  CBC showed mild leukopenia of 3.2 compared to 6.72 months ago.  Abdominal and pelvic CT scan as shown below showed diverticulosis without diverticulitis and no evidence for colitis or other acute abnormalities.  It showed hepatic steatosis.  Her stool C. difficile came back negative as well as her GI panel.  COVID-19 PCR came back negative.  She was advised not to take COVID-19 vaccine due to her autoimmune disease.  The patient was given 2 L bolus of IV normal saline, 40 mg IV Protonix, 4 mg of IV Zofran, 0.5 mg of IV Ativan and 4 mg of p.o. Imodium.  She will be admitted to the  medical monitored bed for further evaluation and management.  PAST MEDICAL HISTORY:   Past Medical History:  Diagnosis Date  . Anxiety   . Diverticulitis   . Dysautonomia ([place])   . Fibromyalgia   . Hypertension   . Lyme disease   . Mast cell activation syndrome ([place])   . Osteopenia 05/2017   T score -2.2 FRAX 11% / 1.7%  . POTS (postural orthostatic tachycardia syndrome)     PAST SURGICAL HISTORY:   Past Surgical History:  Procedure Laterality Date  . Cataract Removal With Lens Implant Placement Left 03-06-2015  . CERVICAL BIOPSY  W/ LOOP ELECTRODE EXCISION  04/19/2004   HGSIL CIN II  MARGINS NEGATIVE  . COLPOSCOPY  02/19/2004   HGSIL CIN II    SOCIAL HISTORY:   Social History   Tobacco Use  . Smoking status: Never Smoker  . Smokeless tobacco: Never Used  Substance Use Topics  . Alcohol use: Yes    Comment: occ    FAMILY HISTORY:   Family History  Problem Relation Age of Onset  . Heart disease Mother        CHF  . Hypertension Mother   . Cancer Father 35       lung cancer  . Hypertension Father   . Hypertension Paternal Grandmother   . Stroke Paternal Grandmother   . Hypertension Paternal Grandfather   . Mental retardation Son  DRUG ALLERGIES:   Allergies  Allergen Reactions  . Epinephrine Shortness Of Breath  . Isovue [Iopamidol]     Mast cell activation syndrome - considerable potential of allergic reaction per pt  Potential anaphylactic reaction  . Azithromycin Diarrhea  . Ciprofloxacin   . Flagyl [Metronidazole]   . Prednisone Other (See Comments)    Was told not to use because of Lymes disease. States she also cannot use steroid drops.   . Sulfa Antibiotics     Caused white count to decrease  . Sulfacetamide Sodium     Caused white count to decrease  . Wheat Bran Other (See Comments)    REVIEW OF SYSTEMS:   ROS As per history of present illness. All pertinent systems were reviewed above. Constitutional,  HEENT, cardiovascular,  respiratory, GI, GU, musculoskeletal, neuro, psychiatric, endocrine,  integumentary and hematologic systems were reviewed and are otherwise  negative/unremarkable except for positive findings mentioned above in the HPI.   MEDICATIONS AT HOME:   Prior to Admission medications   Medication Sig Start Date End Date Taking? Authorizing Provider  amoxicillin-clavulanate (AUGMENTIN) 875-125 MG tablet Take 1 tablet by mouth 2 (two) times daily. One po bid x 7 days 12/04/18   Mesner, Corene Cornea, MD  cetirizine (ZYRTEC ALLERGY) 10 MG tablet Take 10 mg by mouth daily.    [provider]  Cholecalciferol (VITAMIN D-3) 5000 UNITS TABS Take 5,000 tablets by mouth daily.    [provider]  Coenzyme Q10 (CO Q10 PO) Take 400 mg by mouth daily.    [provider]  Ketotifen Fumarate POWD Take 0.25 mg by mouth.     [provider]  METHYLCOBALAMIN PO Take by mouth.    [provider]  montelukast (SINGULAIR) 10 MG tablet Take 10 mg by mouth at bedtime.    [provider]  Omega-3 Fatty Acids (FISH OIL PO) Take 2 capsules by mouth 2 (two) times daily. BARLEANS FISH OIL    [provider]  ondansetron (ZOFRAN ODT) 4 MG disintegrating tablet Take 1 tablet (4 mg total) by mouth every 8 (eight) hours as needed for up to 2 days for nausea or vomiting. 10/11/19 10/13/19  Vanessa Hopewell, MD  pantoprazole (PROTONIX) 40 MG tablet Take 40 mg by mouth daily.    [provider]  Probiotic Product (PROBIOTIC ADVANCED PO) Take 1 capsule by mouth 2 (two) times daily. 100 BILLION    [provider]  QUERCETIN PO Take by mouth.    [provider]  UNABLE TO FIND biestro- care 2 pumps daily it varies progestacare - 3 pumps daily it varies    [provider]      VITAL SIGNS:  Blood pressure 118/70, pulse 88, temperature 97.8 F (36.6 C), resp. rate 18, height 5\' 7"  (1.702 m), weight 74.8 kg, last menstrual period 03/15/2013, SpO2 99  %.  PHYSICAL EXAMINATION:  Physical Exam  GENERAL:  64 y.o.-year-old Caucasian female patient lying in the bed with no acute distress.  EYES: Pupils equal, round, reactive to light and accommodation. No scleral icterus. Extraocular muscles intact.  HEENT: Head atraumatic, normocephalic. Oropharynx and nasopharynx clear.  NECK:  Supple, no jugular venous distention. No thyroid enlargement, no tenderness.  LUNGS: Normal breath sounds bilaterally, no wheezing, rales,rhonchi or crepitation. No use of accessory muscles of respiration.  CARDIOVASCULAR: Regular rate and rhythm, S1, S2 normal. No murmurs, rubs, or gallops.  ABDOMEN: Soft, nondistended, nontender. Bowel sounds present. No organomegaly or mass.  EXTREMITIES: No  pedal edema, cyanosis, or clubbing.  NEUROLOGIC: Cranial nerves II through XII are intact. Muscle strength 5/5 in all extremities. Sensation intact. Gait not checked.  PSYCHIATRIC: The patient is alert and oriented x 3.  Normal affect and good eye contact. SKIN: No obvious rash, lesion, or ulcer.   LABORATORY PANEL:   CBC Recent Labs  Lab 10/11/19 1518  WBC 3.2*  HGB 14.1  HCT 41.7  PLT 217   ------------------------------------------------------------------------------------------------------------------  Chemistries  Recent Labs  Lab 10/11/19 1550  NA 141  K 3.4*  CL 104  CO2 25  GLUCOSE 109*  BUN 11  CREATININE 0.85  CALCIUM 9.2  AST 29  ALT 24  ALKPHOS 98  BILITOT 1.5*   ------------------------------------------------------------------------------------------------------------------  Cardiac Enzymes No results for input(s): TROPONINI in the last 168 hours. ------------------------------------------------------------------------------------------------------------------  RADIOLOGY:  No results found.    IMPRESSION AND PLAN:   1.  Acute gastroenteritis. -Differential diagnosis would include acute viral etiology and could be partly related  to her dysautonomia. -We will be hydrated with IV normal saline. -As needed antiemetics will be provided. -Imodium will be given as needed. -We will monitor her orthostatics. -IV PPI therapy will be provided. -Clear liquid diet will be started to be advanced as tolerated.  2.  Anxiety/depression. -We will continue her Ativan and Lexapro.  3.  Dysautonomia and POTS. -We will continue her Toprol-XL.  4.  GERD. -She will be on IV PPI therapy.  5.  Hypothyroidism. -We will check TSH and continue Synthroid.  6.  Fibromyalgia. -We will continue tizanidine.  7.  DVT prophylaxis. -Subcutaneous Lovenox.    All the records are reviewed and case discussed with ED provider. The plan of care was discussed in details with the patient (and family). I answered all questions. The patient agreed to proceed with the above mentioned plan. Further management will depend upon hospital course.   CODE STATUS: Full code  Status is: Observation  The patient remains OBS appropriate and will d/c before 2 midnights.  Dispo: The patient is from: Home              Anticipated d/c is to: Home              Anticipated d/c date is: 1 day              Patient currently is not medically stable to d/c.  TOTAL TIME TAKING CARE OF THIS PATIENT: 55 minutes.    Christel Mormon M.D on 10/11/2019 at 8:41 PM  [facility]   From 7 PM-7 AM, contact night-coverage [email or web address]  CC: Primary care physician; Robert Bellow, PA-C   Note: This dictation was prepared with Dragon dictation along with smaller phrase technology. Any transcriptional errors that result from this process are unintentional.

## 2019-10-11 NOTE — ED Provider Notes (Addendum)
[facility] Emergency Department Provider Note  ____________________________________________   First MD Initiated Contact with Patient 10/11/19 1544     (approximate)  I have reviewed the triage vital signs and the nursing notes.   HISTORY  Chief Complaint Emesis and Diarrhea    HPI Katie [name] is a 64 y.o. female with chronic Lyme disease, mast cell activation, fibromyalgia, pots disease who comes in with vomiting and diarrhea. Patient reports sudden onset of vomiting diarrhea today right before going to the dentist. Her symptoms have been severe, constant, slightly better with Zofran given earlier, nothing makes them worse. Patient reports that she has had multiple episodes of nonbloody nonbilious vomiting as well as multiple episodes of nonbloody stools. Does report a history of antibiotics a few weeks ago. Endorses little bit abdominal cramping but no focal tenderness. States that she just feels like she has chills and cannot get warm. Patient does report a little bit of back spasming bilaterally but that has since resolved.          Past Medical History:  Diagnosis Date  . Anxiety   . Diverticulitis   . Dysautonomia ([place])   . Fibromyalgia   . Hypertension   . Lyme disease   . Mast cell activation syndrome ([place])   . Osteopenia 05/2017   T score -2.2 FRAX 11% / 1.7%  . POTS (postural orthostatic tachycardia syndrome)     Patient Active Problem List   Diagnosis Date Noted  . Diarrhea 11/14/2015  . Acute gastroenteritis 11/14/2015  . Dehydration   . Enteritis 11/13/2015  . Hyponatremia 11/13/2015  . Elevated blood pressure reading 07/14/2013  . Lyme disease 10/25/2012  . Chronic fatigue syndrome 10/25/2012  . Anxiety and depression 10/25/2012  . Fibromyalgia 10/25/2012  . Hormone replacement therapy - followed by Dr. Lonia Skinner in gyn and by [place] 10/25/2012    Past Surgical History:  Procedure Laterality Date    . Cataract Removal With Lens Implant Placement Left 03-06-2015  . CERVICAL BIOPSY  W/ LOOP ELECTRODE EXCISION  04/19/2004   HGSIL CIN II  MARGINS NEGATIVE  . COLPOSCOPY  02/19/2004   HGSIL CIN II    Prior to Admission medications   Medication Sig Start Date End Date Taking? Authorizing Provider  amoxicillin-clavulanate (AUGMENTIN) 875-125 MG tablet Take 1 tablet by mouth 2 (two) times daily. One po bid x 7 days 12/04/18   Mesner, Corene Cornea, MD  cetirizine (ZYRTEC ALLERGY) 10 MG tablet Take 10 mg by mouth daily.    [provider]  Cholecalciferol (VITAMIN D-3) 5000 UNITS TABS Take 5,000 tablets by mouth daily.    [provider]  Coenzyme Q10 (CO Q10 PO) Take 400 mg by mouth daily.    [provider]  Ketotifen Fumarate POWD Take 0.25 mg by mouth.     [provider]  METHYLCOBALAMIN PO Take by mouth.    [provider]  montelukast (SINGULAIR) 10 MG tablet Take 10 mg by mouth at bedtime.    [provider]  Omega-3 Fatty Acids (FISH OIL PO) Take 2 capsules by mouth 2 (two) times daily. BARLEANS FISH OIL    [provider]  ondansetron (ZOFRAN ODT) 4 MG disintegrating tablet Take 1 tablet (4 mg total) by mouth every 8 (eight) hours as needed for nausea or vomiting. 4mg  ODT q4 hours prn nausea/vomit 12/04/18   Mesner, Corene Cornea, MD  pantoprazole (PROTONIX) 40 MG tablet Take 40 mg by mouth daily.  [provider]  Probiotic Product (PROBIOTIC ADVANCED PO) Take 1 capsule by mouth 2 (two) times daily. 100 BILLION    [provider]  QUERCETIN PO Take by mouth.    [provider]  UNABLE TO FIND biestro- care 2 pumps daily it varies progestacare - 3 pumps daily it varies    [provider]    Allergies Epinephrine, Isovue [iopamidol], Azithromycin, Ciprofloxacin, Flagyl [metronidazole], Prednisone, Sulfa antibiotics, Sulfacetamide sodium, and Wheat bran  Family History  Problem Relation Age of Onset  .  Heart disease Mother        CHF  . Hypertension Mother   . Cancer Father 26       lung cancer  . Hypertension Father   . Hypertension Paternal Grandmother   . Stroke Paternal Grandmother   . Hypertension Paternal Grandfather   . Mental retardation Son     Social History Social History   Tobacco Use  . Smoking status: Never Smoker  . Smokeless tobacco: Never Used  Substance Use Topics  . Alcohol use: Yes    Comment: occ  . Drug use: No      Review of Systems Constitutional: Positive chills Eyes: No visual changes. ENT: No sore throat. Cardiovascular: Denies chest pain. Respiratory: Denies shortness of breath. Gastrointestinal: Some upper abdominal pain, vomiting, diarrhea Genitourinary: Negative for dysuria. Musculoskeletal: Back spasm Skin: Negative for rash. Neurological: Negative for headaches, focal weakness or numbness. All other ROS negative ____________________________________________   PHYSICAL EXAM:  VITAL SIGNS: ED Triage Vitals  Enc Vitals Group     BP 10/11/19 1500 121/72     Pulse Rate 10/11/19 1500 (!) 105     Resp 10/11/19 1500 18     Temp 10/11/19 1500 97.7 F (36.5 C)     Temp Source 10/11/19 1500 Oral     SpO2 10/11/19 1500 98 %     Weight 10/11/19 1511 165 lb (74.8 kg)     Height 10/11/19 1511 5\' 7"  (1.702 m)     Head Circumference --      Peak Flow --      Pain Score 10/11/19 1511 5     Pain Loc --      Pain Edu? --      Excl. in Accident? --     Constitutional: Alert and oriented. Well appearing and in no acute distress.  Patient shivering underneath covers Eyes: Conjunctivae are normal. EOMI. Head: Atraumatic. Nose: No congestion/rhinnorhea. Mouth/Throat: Mucous membranes are moist.   Neck: No stridor. Trachea Midline. FROM Cardiovascular: Tachycardic, regular rhythm. Grossly normal heart sounds.  Good peripheral circulation. Respiratory: Normal respiratory effort.  No retractions. Lungs CTAB. Gastrointestinal: Soft with some  slight epigastric tenderness. No distention. No abdominal bruits.  No rebound, no guarding. Musculoskeletal: No lower extremity tenderness nor edema.  No joint effusions. Neurologic:  Normal speech and language. No gross focal neurologic deficits are appreciated.  Skin:  Skin is warm, dry and intact. No rash noted. Psychiatric: Mood and affect are normal. Speech and behavior are normal. GU: Deferred   ____________________________________________   LABS (all labs ordered are listed, but only abnormal results are displayed)  Labs Reviewed  CBC - Abnormal; Notable for the following components:      Result Value   WBC 3.2 (*)    All other components within normal limits  COMPREHENSIVE METABOLIC PANEL - Abnormal; Notable for the following components:   Potassium 3.4 (*)    Glucose, Bld 109 (*)    Total  Bilirubin 1.5 (*)    All other components within normal limits  GASTROINTESTINAL PANEL BY PCR, STOOL (REPLACES STOOL CULTURE)  C DIFFICILE QUICK SCREEN W PCR REFLEX  SARS CORONAVIRUS 2 BY RT PCR (HOSPITAL ORDER, PERFORMED IN [facility])  LIPASE, BLOOD  URINALYSIS, COMPLETE (UACMP) WITH MICROSCOPIC   ____________________________________________    PROCEDURES  Procedure(s) performed (including Critical Care):  Procedures   ____________________________________________   INITIAL IMPRESSION / ASSESSMENT AND PLAN / ED COURSE  NIKELLE MALATESTA was evaluated in Emergency Department on 10/11/2019 for the symptoms described in the history of present illness. She was evaluated in the context of the global COVID-19 pandemic, which necessitated consideration that the patient might be at risk for infection with the SARS-CoV-2 virus that causes COVID-19. Institutional protocols and algorithms that pertain to the evaluation of patients at risk for COVID-19 are in a state of rapid change based on information released by regulatory bodies including the CDC and federal and state  organizations. These policies and algorithms were followed during the patient's care in the ED.     Patient comes in with several concerns of vomiting diarrhea.  Most likely viral GI bug versus food toxin consider onset of symptoms.  Given patient does have a history of antibiotic use will test for C. difficile.  At this time patient only slightly tender in the upper abdomen.  We will hold off on imaging at this time and get labs to evaluate for cholecystitis, pancreatitis.  She denies any chest pain, shortness of breath to suggest ACS.  Will get labs to evaluate for electrolyte abnormalities, AKI.  Will treat patient's symptoms with fluids and Zofran. Given significant diarrhea will get GI panel to evaluate for specific viral/ bacterial cause and rule out c. Diff.  She has had history of e.coli a few months ago.   Patient requesting medicine for her nerves.  Patient reports that with her disautomnia that she is shivering because of it and that she cannot tolerate taking her Xanax right now due to the nausea and vomiting.  We will give a small dose of IV Ativan to help with both the above as well as the nausea and vomiting.  White count slightly low which could be secondary to viral infection.  Patient does not appear bacteremic or septic at this time she is remained afebrile.  Patient also reported little bit of epigastric abdominal pain.  Patient does report a history of ulcers and given a dose of Protonix.  Patient's labs came back with slightly elevated total bilirubin of 1.5.  She no right upper quadrant tenderness.  Discussed with patient doing ultrasound to rule out gallbladder pathology such as cholecystitis.  Patient stated that her pain is much better at this time and she does not want to have further imaging.  Patient states that she is feeling better and is requesting drinking something.  She does not want to stay for ultrasound.  She understands the risk including worsening gallbladder  infection and she states that she will follow up with her primary care doctor or return to ER if her symptoms are returning.  Patient's been able to tolerate drinking ginger ale.  her C. difficile test was negative.  Her symptoms are getting better.  At this time patient is requesting to be discharged home.  After going to bathroom, pt had multiple BMs and states that she cant stand up and is now too weak to go home. She states because of her dysautonomia she  does not feel comfortable managing at home and feels she needs more fluids.  Will d/w hospital for admission. Added on CT per hospital request.    ____________________________________________   FINAL CLINICAL IMPRESSION(S) / ED DIAGNOSES   Final diagnoses:  Dehydration  Gastroenteritis  Nausea vomiting and diarrhea      MEDICATIONS GIVEN DURING THIS VISIT:  Medications  ondansetron (ZOFRAN) injection 4 mg (4 mg Intravenous Given 10/11/19 1514)  sodium chloride 0.9 % bolus 1,000 mL (1,000 mLs Intravenous New Bag/Given 10/11/19 1515)  LORazepam (ATIVAN) injection 0.5 mg (0.5 mg Intravenous Given 10/11/19 1651)  pantoprazole (PROTONIX) injection 40 mg (40 mg Intravenous Given 10/11/19 1652)     ED Discharge Orders         Ordered    ondansetron (ZOFRAN ODT) 4 MG disintegrating tablet  Every 8 hours PRN     10/11/19 1856           Note:  This document was prepared using Dragon voice recognition software and may include unintentional dictation errors.   Vanessa Willcox, MD 10/11/19 1919    Vanessa Braggs, MD 10/11/19 Lurena Nida    Vanessa [place], MD 10/12/19 1507    Vanessa [place], MD 10/18/19 [phone]

## 2019-10-11 NOTE — Discharge Instructions (Addendum)
No Aortic atherosclerosis seen on CT scan.  The radiologist will make an addendum or correct the report

## 2019-10-11 NOTE — ED Triage Notes (Signed)
Patient presents to the ED with sudden nausea, vomiting and diarrhea that began at 2pm when patient walked into her dentist's office. Patient is incontinent of diarrhea in triage.  Patient states symptoms began very suddenly.  Patient denies abdominal pain.  Patient presents to the ED by EMS. Patient vomiting in triage.

## 2019-10-11 NOTE — ED Notes (Signed)
Sent stool sample to the lab.

## 2019-10-11 NOTE — ED Notes (Addendum)
Pt ambulatory to restroom with no assistance.  Diarrhea noted. Pt stating "I'm just too weak, I can't go home" and laid herself on the floor.  Pt given wipes and clean clothes.  Ambulatory from restroom to treatment room.  Dr. Jari Pigg notified and to bedside.

## 2019-10-11 NOTE — ED Notes (Signed)
This RN to bedside, pt with soiled clothes and linnen. RN provided clean clothes, linnens and wipes. Pt stating "I will get cleaned up later, I am finally warm, I don't feel like doing it now".

## 2019-10-11 NOTE — ED Notes (Signed)
This RN to bedside, pt with soiled clothes and linen. Pt states "I just want to try some ginger ale first". Pt given ginger ale.  This RN asked pt to change into clean clothes and clean linen, pt states "no, I just can't right now". This RN asked pt if she needed assistance with changing and getting cleaned up, pt stated "no, and I have my husband here".  Stretcher locked in lowest position, call bell in place.

## 2019-10-12 DIAGNOSIS — G90A Postural orthostatic tachycardia syndrome (POTS): Secondary | ICD-10-CM | POA: Insufficient documentation

## 2019-10-12 DIAGNOSIS — J309 Allergic rhinitis, unspecified: Secondary | ICD-10-CM | POA: Insufficient documentation

## 2019-10-12 DIAGNOSIS — E039 Hypothyroidism, unspecified: Secondary | ICD-10-CM | POA: Diagnosis not present

## 2019-10-12 DIAGNOSIS — I498 Other specified cardiac arrhythmias: Secondary | ICD-10-CM

## 2019-10-12 DIAGNOSIS — G901 Familial dysautonomia [Riley-Day]: Secondary | ICD-10-CM | POA: Insufficient documentation

## 2019-10-12 DIAGNOSIS — F419 Anxiety disorder, unspecified: Secondary | ICD-10-CM

## 2019-10-12 DIAGNOSIS — K529 Noninfective gastroenteritis and colitis, unspecified: Secondary | ICD-10-CM | POA: Diagnosis not present

## 2019-10-12 DIAGNOSIS — R69 Illness, unspecified: Secondary | ICD-10-CM | POA: Diagnosis not present

## 2019-10-12 LAB — CBC
HCT: 32.5 % — ABNORMAL LOW (ref 36.0–46.0)
Hemoglobin: 11.2 g/dL — ABNORMAL LOW (ref 12.0–15.0)
MCH: 33.6 pg (ref 26.0–34.0)
MCHC: 34.5 g/dL (ref 30.0–36.0)
MCV: 97.6 fL (ref 80.0–100.0)
Platelets: 204 10*3/uL (ref 150–400)
RBC: 3.33 MIL/uL — ABNORMAL LOW (ref 3.87–5.11)
RDW: 13.1 % (ref 11.5–15.5)
WBC: 12.5 10*3/uL — ABNORMAL HIGH (ref 4.0–10.5)
nRBC: 0 % (ref 0.0–0.2)

## 2019-10-12 LAB — BASIC METABOLIC PANEL
Anion gap: 9 (ref 5–15)
BUN: 13 mg/dL (ref 8–23)
CO2: 19 mmol/L — ABNORMAL LOW (ref 22–32)
Calcium: 7.3 mg/dL — ABNORMAL LOW (ref 8.9–10.3)
Chloride: 109 mmol/L (ref 98–111)
Creatinine, Ser: 0.94 mg/dL (ref 0.44–1.00)
GFR calc Af Amer: >60 mL/min (ref >60)
GFR calc non Af Amer: >60 mL/min (ref >60)
Glucose, Bld: 128 mg/dL — ABNORMAL HIGH (ref 70–99)
Potassium: 3.7 mmol/L (ref 3.5–5.1)
Sodium: 137 mmol/L (ref 135–145)

## 2019-10-12 LAB — HIV ANTIBODY (ROUTINE TESTING W REFLEX): HIV Screen 4th Generation wRfx: NONREACTIVE

## 2019-10-12 MED ORDER — ALPRAZOLAM 0.25 MG PO TABS
0.5000 mg | ORAL_TABLET | Freq: Three times a day (TID) | ORAL | Status: DC | PRN
  Administered 2019-10-12: 0.5 mg via ORAL
  Filled 2019-10-12: qty 2

## 2019-10-12 MED ORDER — LOPERAMIDE HCL 2 MG PO TABS
4.0000 mg | ORAL_TABLET | Freq: Three times a day (TID) | ORAL | 0 refills | Status: DC | PRN
Start: 2019-10-12 — End: 2020-01-30

## 2019-10-12 MED ORDER — FOLIC ACID 1 MG PO TABS: 1.0000 mg | ORAL_TABLET | Freq: Every day | ORAL | Status: DC

## 2019-10-12 MED ORDER — LOPERAMIDE HCL 2 MG PO CAPS
2.0000 mg | ORAL_CAPSULE | Freq: Once | ORAL | Status: AC
  Administered 2019-10-12: 2 mg via ORAL

## 2019-10-12 MED ORDER — FLUTICASONE PROPIONATE 50 MCG/ACT NA SUSP
1.0000 | Freq: Every day | NASAL | Status: DC
  Filled 2019-10-12: qty 16

## 2019-10-12 MED ORDER — SODIUM CHLORIDE 0.9 % IV BOLUS
500.0000 mL | Freq: Once | INTRAVENOUS | Status: AC
  Administered 2019-10-12: 500 mL via INTRAVENOUS

## 2019-10-12 MED ORDER — LOPERAMIDE HCL 2 MG PO CAPS
4.0000 mg | ORAL_CAPSULE | Freq: Once | ORAL | Status: AC
  Administered 2019-10-12: 4 mg via ORAL

## 2019-10-12 MED ORDER — METOPROLOL SUCCINATE ER 25 MG PO TB24: 25.0000 mg | ORAL_TABLET | Freq: Every day | ORAL | Status: DC

## 2019-10-12 MED ORDER — LEVOTHYROXINE SODIUM 50 MCG PO TABS
25.0000 ug | ORAL_TABLET | Freq: Every day | ORAL | Status: DC
  Administered 2019-10-12: 25 ug via ORAL
  Filled 2019-10-12: qty 1

## 2019-10-12 MED ORDER — METOPROLOL SUCCINATE ER 50 MG PO TB24
25.0000 mg | ORAL_TABLET | Freq: Every day | ORAL | Status: DC
  Administered 2019-10-12: 25 mg via ORAL
  Filled 2019-10-12: qty 1

## 2019-10-12 MED ORDER — ONDANSETRON HCL 4 MG PO TABS: 4.0000 mg | ORAL_TABLET | Freq: Four times a day (QID) | ORAL | 0 refills | Status: DC | PRN

## 2019-10-12 MED ORDER — AZELASTINE HCL 0.1 % NA SOLN
1.0000 | Freq: Two times a day (BID) | NASAL | Status: DC
  Filled 2019-10-12: qty 30

## 2019-10-12 NOTE — ED Notes (Signed)
Pt cleaned and new brief placed on pt.  

## 2019-10-12 NOTE — Discharge Summary (Signed)
[facility] NAME: Katie [name]    MR#:  [id number]  DATE OF BIRTH:  October 05, 1955  DATE OF ADMISSION:  10/11/2019 ADMITTING PHYSICIAN: Katie Mormon, MD  DATE OF DISCHARGE: 10/12/2019  3:35 PM  PRIMARY CARE PHYSICIAN: Katie Bellow, PA-C    ADMISSION DIAGNOSIS:  Acute gastroenteritis [K52.9]  DISCHARGE DIAGNOSIS:  Active Problems:   Acute gastroenteritis   SECONDARY DIAGNOSIS:   Past Medical History:  Diagnosis Date  . Anxiety   . Diverticulitis   . Dysautonomia ([place])   . Fibromyalgia   . Hypertension   . Lyme disease   . Mast cell activation syndrome ([place])   . Osteopenia 05/2017   T score -2.2 FRAX 11% / 1.7%  . POTS (postural orthostatic tachycardia syndrome)     HOSPITAL COURSE:   1.  Acute gastroenteritis.  Likely viral in etiology.  The patient was feeling much better after being watched overnight and hydrated.  As needed Zofran and Imodium given.  Stool studies were negative.  Patient tolerated solid food. 2.  Dysautonomia and pots syndrome.  Continue Toprol-XL.  Patient states that she gets weekly IV fluid at infusion center. 3.  Hypothyroidism unspecified on levothyroxine 4.  Allergic rhinitis continue usual medications 5.  Anxiety on Xanax  Case discussed with the radiologist that read her CT scan report.  The radiologist stated that there is no aortic atherosclerosis and she will amend the report.  DISCHARGE CONDITIONS:   Satisfactory  CONSULTS OBTAINED:  None  DRUG ALLERGIES:   Allergies  Allergen Reactions  . Epinephrine Shortness Of Breath  . Isovue [Iopamidol]     Mast cell activation syndrome - considerable potential of allergic reaction per pt  Potential anaphylactic reaction  . Azithromycin Diarrhea  . Ciprofloxacin   . Flagyl [Metronidazole]   . Prednisone Other (See Comments)    Was told not to use because of Lymes disease. States she also cannot use steroid drops.   . Sulfa Antibiotics      Caused white count to decrease  . Sulfacetamide Sodium     Caused white count to decrease  . Wheat Bran Other (See Comments)    DISCHARGE MEDICATIONS:   Allergies as of 10/12/2019      Reactions   Epinephrine Shortness Of Breath   Isovue [iopamidol]    Mast cell activation syndrome - considerable potential of allergic reaction per pt Potential anaphylactic reaction   Azithromycin Diarrhea   Ciprofloxacin    Flagyl [metronidazole]    Prednisone Other (See Comments)   Was told not to use because of Lymes disease. States she also cannot use steroid drops.    Sulfa Antibiotics    Caused white count to decrease   Sulfacetamide Sodium    Caused white count to decrease   Wheat Bran Other (See Comments)      Medication List    STOP taking these medications   desmopressin 0.1 MG tablet Commonly known as: DDAVP     TAKE these medications   ALPRAZolam 0.5 MG tablet Commonly known as: XANAX Take 0.5 mg by mouth 3 (three) times daily as needed.   azelastine 0.1 % nasal spray Commonly known as: ASTELIN 1 spray by Nasal route 2 times daily. Use in each nostril as directed   Benadryl Allergy 25 MG tablet Generic drug: diphenhydrAMINE Take 25 mg by mouth nightly as needed for Itching.   budesonide 32 MCG/ACT nasal spray Commonly known as: [place]  into both nostrils daily. 1 spray by Nasal route 2 times daily.   folic acid 1 MG tablet Commonly known as: FOLVITE Take 1 mg by mouth daily.   levothyroxine 25 MCG tablet Commonly known as: SYNTHROID Take 25 mcg by mouth daily.   loperamide 2 MG tablet Commonly known as: Imodium A-D Take 2 tablets (4 mg total) by mouth 3 (three) times daily as needed for diarrhea or loose stools.   metoprolol succinate 25 MG 24 hr tablet Commonly known as: TOPROL-XL Take 1 tablet (25 mg total) by mouth daily. 1 TABLET BY MOUTH 4 TIMES A DAY AS NEEDED What changed:   how much to take  how to take this  when to take this    montelukast 10 MG tablet Commonly known as: SINGULAIR Take 10 mg by mouth at bedtime.   Multi-Vitamin tablet Take by mouth.   omeprazole 20 MG tablet Commonly known as: PRILOSEC OTC Take 20 mg by mouth daily.   ondansetron 4 MG disintegrating tablet Commonly known as: Zofran ODT Take 1 tablet (4 mg total) by mouth every 8 (eight) hours as needed for up to 2 days for nausea or vomiting. What changed: additional instructions   ondansetron 4 MG tablet Commonly known as: ZOFRAN Take 1 tablet (4 mg total) by mouth every 6 (six) hours as needed for nausea.   PROBIOTIC ADVANCED PO Take 1 capsule by mouth 2 (two) times daily. 100 BILLION   traMADol 50 MG tablet Commonly known as: ULTRAM tramadol 50 mg tablet   Vitamin D-3 125 MCG (5000 UT) Tabs Take 5,000 tablets by mouth daily.   ZyrTEC Allergy 10 MG tablet Generic drug: cetirizine Take 10 mg by mouth daily.        DISCHARGE INSTRUCTIONS:   Follow-up PMD 5 days  If you experience worsening of your admission symptoms, develop shortness of breath, life threatening emergency, suicidal or homicidal thoughts you must seek medical attention immediately by calling 911 or calling your MD immediately  if symptoms less severe.  You Must read complete instructions/literature along with all the possible adverse reactions/side effects for all the Medicines you take and that have been prescribed to you. Take any new Medicines after you have completely understood and accept all the possible adverse reactions/side effects.   Please note  You were cared for by a hospitalist during your hospital stay. If you have any questions about your discharge medications or the care you received while you were in the hospital after you are discharged, you can call the unit and asked to speak with the hospitalist on call if the hospitalist that took care of you is not available. Once you are discharged, your primary care physician will handle any further  medical issues. Please note that NO REFILLS for any discharge medications will be authorized once you are discharged, as it is imperative that you return to your primary care physician (or establish a relationship with a primary care physician if you do not have one) for your aftercare needs so that they can reassess your need for medications and monitor your lab values.    Today   CHIEF COMPLAINT:   Chief Complaint  Patient presents with  . Emesis  . Diarrhea    HISTORY OF PRESENT ILLNESS:  Katie [name]  is a 64 y.o. female came in with nausea vomiting and diarrhea   VITAL SIGNS:  Blood pressure 123/81, pulse (!) 105, temperature 98.4 F (36.9 C), temperature source Oral, resp. rate 18, height  5\' 7"  (1.702 m), weight 74.8 kg, last menstrual period 03/15/2013, SpO2 98 %.  I/O:    Intake/Output Summary (Last 24 hours) at 10/12/2019 1620 Last data filed at 10/11/2019 2224 Gross per 24 hour  Intake 2000 ml  Output --  Net 2000 ml    PHYSICAL EXAMINATION:  GENERAL:  64 y.o.-year-old patient lying in the bed with no acute distress.  EYES: Pupils equal, round, reactive to light and accommodation. No scleral icterus. Extraocular muscles intact.  HEENT: Head atraumatic, normocephalic. Oropharynx and nasopharynx clear.  LUNGS: Normal breath sounds bilaterally, no wheezing, rales,rhonchi or crepitation. No use of accessory muscles of respiration.  CARDIOVASCULAR: S1, S2 tachycardic. No murmurs, rubs, or gallops.  ABDOMEN: Soft, non-tender, non-distended. Bowel sounds present. No organomegaly or mass.  EXTREMITIES: No pedal edema, cyanosis, or clubbing.  NEUROLOGIC: Cranial nerves II through XII are intact. Muscle strength 5/5 in all extremities. Sensation intact. Gait not checked.  PSYCHIATRIC: The patient is alert and oriented x 3.  SKIN: No obvious rash, lesion, or ulcer.   DATA REVIEW:   CBC Recent Labs  Lab 10/12/19 0649  WBC 12.5*  HGB 11.2*  HCT 32.5*  PLT 204     Chemistries  Recent Labs  Lab 10/11/19 1550 10/11/19 1550 10/12/19 0649  NA 141   < > 137  K 3.4*   < > 3.7  CL 104   < > 109  CO2 25   < > 19*  GLUCOSE 109*   < > 128*  BUN 11   < > 13  CREATININE 0.85   < > 0.94  CALCIUM 9.2   < > 7.3*  AST 29  --   --   ALT 24  --   --   ALKPHOS 98  --   --   BILITOT 1.5*  --   --    < > = values in this interval not displayed.     Microbiology Results  Results for orders placed or performed during the hospital encounter of 10/11/19  Gastrointestinal Panel by PCR , Stool     Status: None   Collection Time: 10/11/19  2:49 PM   Specimen: Stool  Result Value Ref Range Status   Campylobacter species NOT DETECTED NOT DETECTED Final   Plesimonas shigelloides NOT DETECTED NOT DETECTED Final   Salmonella species NOT DETECTED NOT DETECTED Final   Yersinia enterocolitica NOT DETECTED NOT DETECTED Final   Vibrio species NOT DETECTED NOT DETECTED Final   Vibrio cholerae NOT DETECTED NOT DETECTED Final   Enteroaggregative E coli (EAEC) NOT DETECTED NOT DETECTED Final   Enteropathogenic E coli (EPEC) NOT DETECTED NOT DETECTED Final   Enterotoxigenic E coli (ETEC) NOT DETECTED NOT DETECTED Final   Shiga like toxin producing E coli (STEC) NOT DETECTED NOT DETECTED Final   Shigella/Enteroinvasive E coli (EIEC) NOT DETECTED NOT DETECTED Final   Cryptosporidium NOT DETECTED NOT DETECTED Final   Cyclospora cayetanensis NOT DETECTED NOT DETECTED Final   Entamoeba histolytica NOT DETECTED NOT DETECTED Final   Giardia lamblia NOT DETECTED NOT DETECTED Final   Adenovirus F40/41 NOT DETECTED NOT DETECTED Final   Astrovirus NOT DETECTED NOT DETECTED Final   Norovirus GI/GII NOT DETECTED NOT DETECTED Final   Rotavirus A NOT DETECTED NOT DETECTED Final   Sapovirus (I, II, IV, and V) NOT DETECTED NOT DETECTED Final    Comment: Performed at [facility], [address], [place], [place] [address]  C Difficile Quick Screen w PCR reflex  Status: None   Collection Time: 10/11/19  2:49 PM   Specimen: STOOL  Result Value Ref Range Status   C Diff antigen NEGATIVE NEGATIVE Final   C Diff toxin NEGATIVE NEGATIVE Final   C Diff interpretation No C. difficile detected.  Final    Comment: Performed at [facility], [address] [place], [place] [address]  SARS Coronavirus 2 by RT PCR (hospital order, performed in [facility] hospital lab) Nasopharyngeal Nasopharyngeal Swab     Status: None   Collection Time: 10/11/19  4:22 PM   Specimen: Nasopharyngeal Swab  Result Value Ref Range Status   SARS Coronavirus 2 NEGATIVE NEGATIVE Final    Comment: (NOTE) SARS-CoV-2 target nucleic acids are NOT DETECTED. The SARS-CoV-2 RNA is generally detectable in upper and lower respiratory specimens during the acute phase of infection. The lowest concentration of SARS-CoV-2 viral copies this assay can detect is 250 copies / mL. A negative result does not preclude SARS-CoV-2 infection and should not be used as the sole basis for treatment or other patient management decisions.  A negative result may occur with improper specimen collection / handling, submission of specimen other than nasopharyngeal swab, presence of viral mutation(s) within the areas targeted by this assay, and inadequate number of viral copies (<250 copies / mL). A negative result must be combined with clinical observations, patient history, and epidemiological information. Fact Sheet for Patients:   StrictlyIdeas.no Fact Sheet for Healthcare Providers: BankingDealers.co.za This test is not yet approved or cleared  by the [place] FDA and has been authorized for detection and/or diagnosis of SARS-CoV-2 by FDA under an Emergency Use Authorization (EUA).  This EUA will remain in effect (meaning this test can be used) for the duration of the COVID-19 declaration under Section 564(b)(1) of the Act, 21  U.S.C. section 360bbb-3(b)(1), unless the authorization is terminated or revoked sooner. Performed at [facility], [address] [place], [place] [address]     RADIOLOGY:  CT ABDOMEN PELVIS WO CONTRAST  Addendum Date: 10/12/2019   ADDENDUM REPORT: 10/12/2019 13:37 ADDENDUM: This addendum is created to correct the impression. Aortic atherosclerosis was included in the impression, however there is no aortic atherosclerosis and this was added in error. Electronically Signed   By: Keith Rake M.D.   On: 10/12/2019 13:37   Result Date: 10/12/2019 CLINICAL DATA:  Vomiting and diarrhea. Abdominal cramping. Diverticulitis suspected. EXAM: CT ABDOMEN AND PELVIS WITHOUT CONTRAST TECHNIQUE: Multidetector CT imaging of the abdomen and pelvis was performed following the standard protocol without IV contrast. COMPARISON:  CT 11/26/2017 FINDINGS: Lower chest: No pleural fluid or confluent airspace disease. Hepatobiliary: Mild hepatic steatosis. No evidence of focal lesion. Gallbladder physiologically distended, no calcified stone. No biliary dilatation. Pancreas: No ductal dilatation or inflammation. Spleen: Normal in size without focal abnormality. Adrenals/Urinary Tract: Normal adrenal glands. No hydronephrosis. No perinephric edema. No renal or ureteral calculi. Urinary bladder is partially distended. No bladder wall thickening. Stomach/Bowel: Distended stomach with intraluminal fluid. No gastric wall thickening. Scattered fluid-filled small bowel loops in the left abdomen and lower pelvis without abnormal distension or obstruction. No perienteric inflammation. Normal appendix. Scattered liquid stool throughout the colon which is otherwise decompressed. Multifocal colonic diverticulosis. No focal diverticulitis, colonic wall thickening or pericolonic edema. Vascular/Lymphatic: No significant vascular findings are present. No enlarged abdominal or pelvic lymph nodes. Reproductive: Normal caliber  abdominal aorta. No enlarged lymph nodes in the abdomen or pelvis. Other: No ascites or free air. No focal fluid collection. Tiny  fat containing umbilical hernia. Musculoskeletal: There are no acute or suspicious osseous abnormalities. Grade 1 anterolisthesis of L4 on L5 is likely facet mediated. IMPRESSION: 1. Scattered liquid stool throughout the colon, can be seen with diarrheal illness. Multifocal colonic diverticula. No evidence of diverticulitis, colonic wall thickening or pericolonic inflammation. 2. Fluid distended stomach without evidence of gastric wall thickening or gastric outlet obstruction. 3. Mild hepatic steatosis. Aortic Atherosclerosis (ICD10-I70.0). Electronically Signed: By: Keith Rake M.D. On: 10/11/2019 20:57      Management plans discussed with the patient, family and they are in agreement.  CODE STATUS:     Code Status Orders  (From admission, onward)         Start     Ordered   10/11/19 2226  Full code  Continuous     10/11/19 2227        Code Status History    Date Active Date Inactive Code Status Order ID Comments User Context   11/13/2015 2231 11/14/2015 1324 Full Code [id number]  Reubin Milan, MD ED   Advance Care Planning Activity      TOTAL TIME TAKING CARE OF THIS PATIENT: 35 minutes.    Loletha Grayer M.D on 10/12/2019 at 4:20 PM  Between 7am to 6pm - Pager - [phone]  After 6pm go to [email or web address] - password [email or web address]  [facility]  CC: Primary care physician; Katie Bellow, PA-C

## 2019-10-12 NOTE — ED Notes (Signed)
Pt ambulated to restroom to have bowel movement

## 2019-10-14 DIAGNOSIS — Z8719 Personal history of other diseases of the digestive system: Secondary | ICD-10-CM | POA: Diagnosis not present

## 2019-10-14 DIAGNOSIS — J302 Other seasonal allergic rhinitis: Secondary | ICD-10-CM | POA: Diagnosis not present

## 2019-10-14 DIAGNOSIS — J019 Acute sinusitis, unspecified: Secondary | ICD-10-CM | POA: Diagnosis not present

## 2019-10-17 ENCOUNTER — Other Ambulatory Visit: Payer: Self-pay

## 2019-10-18 ENCOUNTER — Ambulatory Visit: Payer: Medicare HMO | Admitting: Obstetrics & Gynecology

## 2019-10-18 ENCOUNTER — Encounter: Payer: Self-pay | Admitting: Obstetrics & Gynecology

## 2019-10-18 VITALS — BP 110/68 | Ht 67.0 in | Wt 161.4 lb

## 2019-10-18 DIAGNOSIS — Z780 Asymptomatic menopausal state: Secondary | ICD-10-CM | POA: Diagnosis not present

## 2019-10-18 DIAGNOSIS — Z9189 Other specified personal risk factors, not elsewhere classified: Secondary | ICD-10-CM

## 2019-10-18 DIAGNOSIS — M8589 Other specified disorders of bone density and structure, multiple sites: Secondary | ICD-10-CM | POA: Diagnosis not present

## 2019-10-18 DIAGNOSIS — Z01419 Encounter for gynecological examination (general) (routine) without abnormal findings: Secondary | ICD-10-CM | POA: Diagnosis not present

## 2019-10-18 DIAGNOSIS — R921 Mammographic calcification found on diagnostic imaging of breast: Secondary | ICD-10-CM | POA: Diagnosis not present

## 2019-10-18 DIAGNOSIS — Z79890 Hormone replacement therapy (postmenopausal): Secondary | ICD-10-CM

## 2019-10-18 NOTE — Progress Notes (Signed)
Katie [name] 1955-05-27 [id number]   History:    64 y.o. G2P2L2 Married x 5+ yrs.  Son with Autism.  RP:  Established patient presenting for annual gyn exam   HPI: Menopause, well on HRT with Estradiol gel daily and Progesterone gel cyclically 14 days/month.  No PMB.  No pelvic pain.  H/O LEEP >15 yrs ago, paps normal since then.  Rarely sexually active, some dryness.  Husband with Bladder Cancer. Screening Mammo 09/2019: Rt Breast normal.  Lt Breast with Ca++ at the upper/outer quadrant, Lt Dx mammo done 10/04/2019, Lt breast Bx recommended.   Patient also did a thermogram on October 10, 2019 which showed an increased risk on the left breast.  Urine/BMs wnl. BMI 25.28.  Health labs with Fam MD.     Past medical history,surgical history, family history and social history were all reviewed and documented in the EPIC chart.  Gynecologic History Patient's last menstrual period was 03/15/2013.  Obstetric History OB History  Gravida Para Term Preterm AB Living  2 2       2   SAB TAB Ectopic Multiple Live Births               # Outcome Date GA Lbr Len/2nd Weight Sex Delivery Anes PTL Lv  2 Para           1 Para              ROS: A ROS was performed and pertinent positives and negatives are included in the history.  GENERAL: No fevers or chills. HEENT: No change in vision, no earache, sore throat or sinus congestion. NECK: No pain or stiffness. CARDIOVASCULAR: No chest pain or pressure. No palpitations. PULMONARY: No shortness of breath, cough or wheeze. GASTROINTESTINAL: No abdominal pain, nausea, vomiting or diarrhea, melena or bright red blood per rectum. GENITOURINARY: No urinary frequency, urgency, hesitancy or dysuria. MUSCULOSKELETAL: No joint or muscle pain, no back pain, no recent trauma. DERMATOLOGIC: No rash, no itching, no lesions. ENDOCRINE: No polyuria, polydipsia, no heat or cold intolerance. No recent change in weight. HEMATOLOGICAL: No anemia or easy bruising or bleeding.  NEUROLOGIC: No headache, seizures, numbness, tingling or weakness. PSYCHIATRIC: No depression, no loss of interest in normal activity or change in sleep pattern.     Exam:   BP 110/68   Ht 5\' 7"  (1.702 m)   Wt 161 lb 6.4 oz (73.2 kg)   LMP 03/15/2013   BMI 25.28 kg/m   Body mass index is 25.28 kg/m.  General appearance : Well developed well nourished female. No acute distress HEENT: Eyes: no retinal hemorrhage or exudates,  Neck supple, trachea midline, no carotid bruits, no thyroidmegaly Lungs: Clear to auscultation, no rhonchi or wheezes, or rib retractions  Heart: Regular rate and rhythm, no murmurs or gallops Breast:Examined in sitting and supine position were symmetrical in appearance, no palpable masses or tenderness,  no skin retraction, no nipple inversion, no nipple discharge, no skin discoloration, no axillary or supraclavicular lymphadenopathy Abdomen: no palpable masses or tenderness, no rebound or guarding Extremities: no edema or skin discoloration or tenderness  Pelvic: Vulva: Normal             Vagina: No gross lesions or discharge  Cervix: No gross lesions or discharge  Uterus  AV, normal size, shape and consistency, non-tender and mobile  Adnexa  Without masses or tenderness  Anus: Normal   Assessment/Plan:  64 y.o. female for annual exam   1. Well female exam  with routine gynecological exam Normal gynecologic exam in menopause.  No indication to repeat a Pap test this year.  Breast exam normal.  Breast calcification on the left.  Health labs with family PA.  Good body mass index at 25.28.  Continue with healthy nutrition and fitness.  2. Postmenopausal On systemic gel hormone replacement therapy with estradiol and progesterone through integrative physician.  Recommend stopping hormone replacement therapy until investigation for left breast calcification is completed.  3. Breast calcification, left Left breast Bx recommended.  Patient declines to proceed  with a Lt breast Bx as recommended at this time, in spite of the risk that a Left Breast Cancer is present.  Desires a second opinion.  Will refer for a Lt Dx Mammo/US in 3 months at the [facility].  4. Osteopenia of multiple sites Osteopenia at multiple sites in January 2019.  Recommend a repeat bone density.  Vitamin D supplements, calcium intake of 1200 mg daily and regular weightbearing physical activities.  Princess Bruins MD, 3:41 PM 10/18/2019

## 2019-10-19 DIAGNOSIS — I498 Other specified cardiac arrhythmias: Secondary | ICD-10-CM | POA: Diagnosis not present

## 2019-10-24 ENCOUNTER — Telehealth: Payer: Self-pay | Admitting: *Deleted

## 2019-10-24 ENCOUNTER — Encounter: Payer: Self-pay | Admitting: Obstetrics & Gynecology

## 2019-10-24 DIAGNOSIS — R921 Mammographic calcification found on diagnostic imaging of breast: Secondary | ICD-10-CM

## 2019-10-24 DIAGNOSIS — E049 Nontoxic goiter, unspecified: Secondary | ICD-10-CM | POA: Diagnosis not present

## 2019-10-24 NOTE — Patient Instructions (Signed)
1. Well female exam with routine gynecological exam Normal gynecologic exam in menopause.  No indication to repeat a Pap test this year.  Breast exam normal.  Breast calcification on the left.  Health labs with family PA.  Good body mass index at 25.28.  Continue with healthy nutrition and fitness.  2. Postmenopausal On systemic gel hormone replacement therapy with estradiol and progesterone through integrative physician.  Recommend stopping hormone replacement therapy until investigation for left breast calcification is completed.  3. Breast calcification, left Left breast Bx recommended.  Patient declines to proceed with a Lt breast Bx as recommended at this time, in spite of the risk that a Left Breast Cancer is present.  Desires a second opinion.  Will refer for a Lt Dx Mammo/US in 3 months at the [facility].  4. Osteopenia of multiple sites Osteopenia at multiple sites in January 2019.  Recommend a repeat bone density.  Vitamin D supplements, calcium intake of 1200 mg daily and regular weightbearing physical activities.  Tailyn, it was a pleasure seeing you today!

## 2019-10-24 NOTE — Telephone Encounter (Signed)
I called patient to discuss the below and she did not want to schedule now, would prefer to schedule in 3 months. I also informed her of the recommendations for stopping HRT.

## 2019-10-24 NOTE — Telephone Encounter (Signed)
-----   Message from Princess Bruins, MD sent at 10/18/2019  4:29 PM EDT ----- Regarding: Refer to the [facility] Left breast Upper/Outer Quadrant Ca++ on Left Dx Mammo 10/04/2019.  Left Breast biopsy recommended, patient declined.  Desires a second opinion.  Repeat Lt Dx Mammo/US at or before 3 months at the [facility].  Please inform patient that I strongly recommend stopping her HRT both the Estradiol and the Progesterone gel until the repeat Lt Dx mammo/US.  Stopping HRT will avoid stimulating a cancer if present.

## 2019-10-25 DIAGNOSIS — A059 Bacterial foodborne intoxication, unspecified: Secondary | ICD-10-CM | POA: Diagnosis not present

## 2019-10-25 DIAGNOSIS — I10 Essential (primary) hypertension: Secondary | ICD-10-CM | POA: Diagnosis not present

## 2019-10-25 DIAGNOSIS — B370 Candidal stomatitis: Secondary | ICD-10-CM | POA: Diagnosis not present

## 2019-10-25 DIAGNOSIS — Z1231 Encounter for screening mammogram for malignant neoplasm of breast: Secondary | ICD-10-CM | POA: Diagnosis not present

## 2019-10-25 DIAGNOSIS — D8940 Mast cell activation, unspecified: Secondary | ICD-10-CM | POA: Diagnosis not present

## 2019-10-25 DIAGNOSIS — R69 Illness, unspecified: Secondary | ICD-10-CM | POA: Diagnosis not present

## 2019-10-25 DIAGNOSIS — E039 Hypothyroidism, unspecified: Secondary | ICD-10-CM | POA: Diagnosis not present

## 2019-10-25 DIAGNOSIS — I951 Orthostatic hypotension: Secondary | ICD-10-CM | POA: Diagnosis not present

## 2019-10-25 DIAGNOSIS — G909 Disorder of the autonomic nervous system, unspecified: Secondary | ICD-10-CM | POA: Diagnosis not present

## 2019-10-25 DIAGNOSIS — R5383 Other fatigue: Secondary | ICD-10-CM | POA: Diagnosis not present

## 2019-10-25 NOTE — Telephone Encounter (Signed)
Dr.Lavoie follow up from your original message below, patient called back asked if you think it would be best to repeat left diag. mammo and left breast ultrasound now verses waiting 3 months? I did tell her I can place the orders and once her films arrived from previous imaging center she can call to schedule. However she wanted you thoughts. Please advise

## 2019-10-26 ENCOUNTER — Other Ambulatory Visit: Payer: Self-pay | Admitting: Endocrinology

## 2019-10-26 DIAGNOSIS — I498 Other specified cardiac arrhythmias: Secondary | ICD-10-CM | POA: Diagnosis not present

## 2019-10-26 DIAGNOSIS — E049 Nontoxic goiter, unspecified: Secondary | ICD-10-CM

## 2019-10-26 DIAGNOSIS — G4733 Obstructive sleep apnea (adult) (pediatric): Secondary | ICD-10-CM | POA: Diagnosis not present

## 2019-10-31 ENCOUNTER — Encounter (INDEPENDENT_AMBULATORY_CARE_PROVIDER_SITE_OTHER): Payer: Medicare HMO | Admitting: Ophthalmology

## 2019-10-31 DIAGNOSIS — Z20822 Contact with and (suspected) exposure to covid-19: Secondary | ICD-10-CM | POA: Diagnosis not present

## 2019-10-31 DIAGNOSIS — R0982 Postnasal drip: Secondary | ICD-10-CM | POA: Diagnosis not present

## 2019-10-31 DIAGNOSIS — G4733 Obstructive sleep apnea (adult) (pediatric): Secondary | ICD-10-CM | POA: Diagnosis not present

## 2019-10-31 DIAGNOSIS — J0141 Acute recurrent pansinusitis: Secondary | ICD-10-CM | POA: Diagnosis not present

## 2019-11-01 DIAGNOSIS — I498 Other specified cardiac arrhythmias: Secondary | ICD-10-CM | POA: Diagnosis not present

## 2019-11-01 NOTE — Telephone Encounter (Signed)
Given that the recommendation is to do a biopsy which patient refuses, for sure repeating the Dx mammo/US now is better than waiting.

## 2019-11-01 NOTE — Telephone Encounter (Signed)
Patient informed with below note, she has films transferred to the breast center already and asked if I could put the order in and she will call to schedule. Patient did say she will be leaving out of town for 2 weeks and won't be able to schedule until after then. Orders placed aware she can call to schedule.

## 2019-11-02 ENCOUNTER — Other Ambulatory Visit: Payer: Self-pay | Admitting: Obstetrics & Gynecology

## 2019-11-02 DIAGNOSIS — R921 Mammographic calcification found on diagnostic imaging of breast: Secondary | ICD-10-CM

## 2019-11-04 NOTE — Telephone Encounter (Signed)
Patient scheduled on 12/19/19

## 2019-11-10 ENCOUNTER — Other Ambulatory Visit: Payer: Medicare HMO

## 2019-11-11 DIAGNOSIS — I498 Other specified cardiac arrhythmias: Secondary | ICD-10-CM | POA: Diagnosis not present

## 2019-11-21 DIAGNOSIS — I498 Other specified cardiac arrhythmias: Secondary | ICD-10-CM | POA: Diagnosis not present

## 2019-11-28 ENCOUNTER — Ambulatory Visit
Admission: RE | Admit: 2019-11-28 | Discharge: 2019-11-28 | Disposition: A | Discharge status: Home / Self Care | Payer: Medicare HMO | Source: Ambulatory Visit | Attending: Endocrinology | Admitting: Endocrinology

## 2019-11-28 DIAGNOSIS — E049 Nontoxic goiter, unspecified: Secondary | ICD-10-CM

## 2019-11-28 DIAGNOSIS — E042 Nontoxic multinodular goiter: Secondary | ICD-10-CM | POA: Diagnosis not present

## 2019-11-30 DIAGNOSIS — G4733 Obstructive sleep apnea (adult) (pediatric): Secondary | ICD-10-CM | POA: Diagnosis not present

## 2019-11-30 DIAGNOSIS — M6530 Trigger finger, unspecified finger: Secondary | ICD-10-CM | POA: Diagnosis not present

## 2019-11-30 DIAGNOSIS — U071 COVID-19: Secondary | ICD-10-CM | POA: Diagnosis not present

## 2019-11-30 DIAGNOSIS — I951 Orthostatic hypotension: Secondary | ICD-10-CM | POA: Diagnosis not present

## 2019-11-30 DIAGNOSIS — Z6824 Body mass index (BMI) 24.0-24.9, adult: Secondary | ICD-10-CM | POA: Diagnosis not present

## 2019-11-30 DIAGNOSIS — E162 Hypoglycemia, unspecified: Secondary | ICD-10-CM | POA: Diagnosis not present

## 2019-11-30 DIAGNOSIS — G4709 Other insomnia: Secondary | ICD-10-CM | POA: Diagnosis not present

## 2019-11-30 DIAGNOSIS — I10 Essential (primary) hypertension: Secondary | ICD-10-CM | POA: Diagnosis not present

## 2019-11-30 DIAGNOSIS — Z8619 Personal history of other infectious and parasitic diseases: Secondary | ICD-10-CM | POA: Diagnosis not present

## 2019-12-02 DIAGNOSIS — I498 Other specified cardiac arrhythmias: Secondary | ICD-10-CM | POA: Diagnosis not present

## 2019-12-07 DIAGNOSIS — I498 Other specified cardiac arrhythmias: Secondary | ICD-10-CM | POA: Diagnosis not present

## 2019-12-08 DIAGNOSIS — M65342 Trigger finger, left ring finger: Secondary | ICD-10-CM | POA: Diagnosis not present

## 2019-12-12 DIAGNOSIS — R7989 Other specified abnormal findings of blood chemistry: Secondary | ICD-10-CM | POA: Diagnosis not present

## 2019-12-12 DIAGNOSIS — E568 Deficiency of other vitamins: Secondary | ICD-10-CM | POA: Diagnosis not present

## 2019-12-12 DIAGNOSIS — E538 Deficiency of other specified B group vitamins: Secondary | ICD-10-CM | POA: Diagnosis not present

## 2019-12-14 DIAGNOSIS — I498 Other specified cardiac arrhythmias: Secondary | ICD-10-CM | POA: Diagnosis not present

## 2019-12-28 ENCOUNTER — Telehealth: Payer: Self-pay | Admitting: *Deleted

## 2019-12-28 DIAGNOSIS — I498 Other specified cardiac arrhythmias: Secondary | ICD-10-CM | POA: Diagnosis not present

## 2019-12-28 NOTE — Telephone Encounter (Signed)
Patient was seen on 10/18/19 for annual exam and she wanted a second opinion told to follow up in 3 months for Lt Dx Mammo/US at the [facility]. She was scheduled for this appointment, however the radiologist told the staff to cancel her appointment because no second opinion is needed to follow up breast calcifications. Patient asked what is she to do now, she still declines to have breast biopsy. Should I check to see if general surgeon will provide her with second opinion?  Please advise

## 2019-12-29 DIAGNOSIS — R7989 Other specified abnormal findings of blood chemistry: Secondary | ICD-10-CM | POA: Diagnosis not present

## 2019-12-30 DIAGNOSIS — E063 Autoimmune thyroiditis: Secondary | ICD-10-CM | POA: Diagnosis not present

## 2019-12-30 DIAGNOSIS — D8940 Mast cell activation, unspecified: Secondary | ICD-10-CM | POA: Diagnosis not present

## 2019-12-31 DIAGNOSIS — G4733 Obstructive sleep apnea (adult) (pediatric): Secondary | ICD-10-CM | POA: Diagnosis not present

## 2020-01-02 NOTE — Telephone Encounter (Signed)
This is a problem because I do think that the safest would be to do a breast biopsy.  So, I recommend not to call it a second opinion, but rather to ask for counseling from a Surgeon who does breast surgeries to discuss the Mammographic calcification findings with her.

## 2020-01-02 NOTE — Telephone Encounter (Signed)
Patient said she was okay with having appointment with general breast surgeon. Report she is worried about the "metal clips" placed in her breast. I explained I am not familiar with the procedure and what the radiologist use during breast biopsy. Patient reports [facility] radiologist use breast clips, she thought the breast center of [place] didn't use "metal clips" but was told they do as well. Patient reports she doesn't mind having breast biopsy, she just does not want to have the metal clips placed in her breast. I once again told her that would be something she would need to discuss with the radiologist. Patient would like to proceed with general surgery appointment,message sent to [place] to help schedule this.

## 2020-01-03 DIAGNOSIS — A048 Other specified bacterial intestinal infections: Secondary | ICD-10-CM | POA: Diagnosis not present

## 2020-01-05 NOTE — Telephone Encounter (Signed)
Scheduled with Dr. Georgette Dover on 9/24 at 9:20 AM with arrival of 8:50 AM.

## 2020-01-30 ENCOUNTER — Encounter (HOSPITAL_BASED_OUTPATIENT_CLINIC_OR_DEPARTMENT_OTHER): Payer: Self-pay | Admitting: *Deleted

## 2020-01-30 ENCOUNTER — Emergency Department (HOSPITAL_BASED_OUTPATIENT_CLINIC_OR_DEPARTMENT_OTHER)
Admission: EM | Admit: 2020-01-30 | Discharge: 2020-01-30 | Disposition: A | Discharge status: Home / Self Care | Payer: Medicare HMO | Attending: Emergency Medicine | Admitting: Emergency Medicine

## 2020-01-30 ENCOUNTER — Emergency Department (HOSPITAL_BASED_OUTPATIENT_CLINIC_OR_DEPARTMENT_OTHER): Payer: Medicare HMO

## 2020-01-30 ENCOUNTER — Other Ambulatory Visit: Payer: Self-pay

## 2020-01-30 DIAGNOSIS — R000 Tachycardia, unspecified: Secondary | ICD-10-CM | POA: Insufficient documentation

## 2020-01-30 DIAGNOSIS — R0602 Shortness of breath: Secondary | ICD-10-CM | POA: Diagnosis present

## 2020-01-30 DIAGNOSIS — E039 Hypothyroidism, unspecified: Secondary | ICD-10-CM | POA: Diagnosis not present

## 2020-01-30 DIAGNOSIS — R5383 Other fatigue: Secondary | ICD-10-CM | POA: Diagnosis not present

## 2020-01-30 DIAGNOSIS — R0789 Other chest pain: Secondary | ICD-10-CM | POA: Diagnosis not present

## 2020-01-30 DIAGNOSIS — R531 Weakness: Secondary | ICD-10-CM

## 2020-01-30 DIAGNOSIS — I10 Essential (primary) hypertension: Secondary | ICD-10-CM | POA: Diagnosis not present

## 2020-01-30 LAB — CBC WITH DIFFERENTIAL/PLATELET
Abs Immature Granulocytes: 0.01 10*3/uL (ref 0.00–0.07)
Basophils Absolute: 0.1 10*3/uL (ref 0.0–0.1)
Basophils Relative: 1 %
Eosinophils Absolute: 0.2 10*3/uL (ref 0.0–0.5)
Eosinophils Relative: 3 %
HCT: 39.7 % (ref 36.0–46.0)
Hemoglobin: 13.2 g/dL (ref 12.0–15.0)
Immature Granulocytes: 0 %
Lymphocytes Relative: 30 %
Lymphs Abs: 1.7 10*3/uL (ref 0.7–4.0)
MCH: 32.4 pg (ref 26.0–34.0)
MCHC: 33.2 g/dL (ref 30.0–36.0)
MCV: 97.5 fL (ref 80.0–100.0)
Monocytes Absolute: 0.6 10*3/uL (ref 0.1–1.0)
Monocytes Relative: 11 %
Neutro Abs: 3 10*3/uL (ref 1.7–7.7)
Neutrophils Relative %: 55 %
Platelets: 281 10*3/uL (ref 150–400)
RBC: 4.07 MIL/uL (ref 3.87–5.11)
RDW: 12.7 % (ref 11.5–15.5)
WBC: 5.5 10*3/uL (ref 4.0–10.5)
nRBC: 0 % (ref 0.0–0.2)

## 2020-01-30 LAB — COMPREHENSIVE METABOLIC PANEL
ALT: 19 U/L (ref 0–44)
AST: 26 U/L (ref 15–41)
Albumin: 4 g/dL (ref 3.5–5.0)
Alkaline Phosphatase: 77 U/L (ref 38–126)
Anion gap: 12 (ref 5–15)
BUN: 7 mg/dL — ABNORMAL LOW (ref 8–23)
CO2: 26 mmol/L (ref 22–32)
Calcium: 9.1 mg/dL (ref 8.9–10.3)
Chloride: 98 mmol/L (ref 98–111)
Creatinine, Ser: 0.7 mg/dL (ref 0.44–1.00)
GFR calc Af Amer: >60 mL/min (ref >60)
GFR calc non Af Amer: >60 mL/min (ref >60)
Glucose, Bld: 107 mg/dL — ABNORMAL HIGH (ref 70–99)
Potassium: 4.2 mmol/L (ref 3.5–5.1)
Sodium: 136 mmol/L (ref 135–145)
Total Bilirubin: 0.5 mg/dL (ref 0.3–1.2)
Total Protein: 7.6 g/dL (ref 6.5–8.1)

## 2020-01-30 LAB — TROPONIN I (HIGH SENSITIVITY): Troponin I (High Sensitivity): 4 ng/L (ref <18)

## 2020-01-30 MED ORDER — SODIUM CHLORIDE 0.9 % IV BOLUS
1000.0000 mL | Freq: Once | INTRAVENOUS | Status: AC
  Administered 2020-01-30: 1000 mL via INTRAVENOUS

## 2020-01-30 NOTE — Discharge Instructions (Signed)
You were seen in the ED today with trouble breathing and weakness. Your labs here are reassuring and you are feeling better after IV fluids. Please call your PCP today to discuss your ED visit and symptoms and schedule a follow up appointment in the coming week. Return to the ED with any new or suddenly worsening symptoms.

## 2020-01-30 NOTE — ED Notes (Signed)
ED Provider at bedside. 

## 2020-01-30 NOTE — ED Provider Notes (Signed)
Emergency Department Provider Note   I have reviewed the triage vital signs and the nursing notes.   HISTORY  Chief Complaint Shortness of Breath and Weakness   HPI Katie [name] is a 64 y.o. female with past medical history reviewed below including POTS presents to the ED for evaluation of fatigue with elevated heart rate, shortness of breath, and mild chest discomfort.  Patient states that she is had similar symptoms in the past and she is low on fluids and goes for weekly IV fluid infusions.  She tells me she had a lot of family stress in the mid week last week and thinks this could have been a trigger.  She states she has not been eating or drinking very well in the past several days.  Last night she had heart rate elevated to 130 which was uncomfortable and kept her awake.  She tried drinking fluids with no relief and so presents to the ED for evaluation. No radiation of symptoms or modifying factors.   Past Medical History:  Diagnosis Date  . Anxiety   . Diverticulitis   . Dysautonomia ([place])   . Fibromyalgia   . Hypertension   . Lyme disease   . Mast cell activation syndrome ([place])   . Osteopenia 05/2017   T score -2.2 FRAX 11% / 1.7%  . POTS (postural orthostatic tachycardia syndrome)     Patient Active Problem List   Diagnosis Date Noted  . Dysautonomia ([place])   . POTS (postural orthostatic tachycardia syndrome)   . Hypothyroidism   . Allergic rhinitis   . Diarrhea 11/14/2015  . Acute gastroenteritis 11/14/2015  . Dehydration   . Enteritis 11/13/2015  . Hyponatremia 11/13/2015  . Elevated blood pressure reading 07/14/2013  . Lyme disease 10/25/2012  . Chronic fatigue syndrome 10/25/2012  . Anxiety 10/25/2012  . Fibromyalgia 10/25/2012  . Hormone replacement therapy - followed by Dr. Lonia Skinner in gyn and by [place] 10/25/2012    Past Surgical History:  Procedure Laterality Date  . Cataract Removal With Lens Implant Placement Left  03-06-2015  . CERVICAL BIOPSY  W/ LOOP ELECTRODE EXCISION  04/19/2004   HGSIL CIN II  MARGINS NEGATIVE  . COLPOSCOPY  02/19/2004   HGSIL CIN II    Allergies Epinephrine, Isovue [iopamidol], Azithromycin, Ciprofloxacin, Flagyl [metronidazole], Prednisone, Sulfa antibiotics, Sulfacetamide sodium, and Wheat bran  Family History  Problem Relation Age of Onset  . Heart disease Mother        CHF  . Hypertension Mother   . Cancer Father 81       lung cancer  . Hypertension Father   . Hypertension Paternal Grandmother   . Stroke Paternal Grandmother   . Hypertension Paternal Grandfather   . Mental retardation Son     Social History Social History   Tobacco Use  . Smoking status: Never Smoker  . Smokeless tobacco: Never Used  Vaping Use  . Vaping Use: Never used  Substance Use Topics  . Alcohol use: Yes    Comment: occ  . Drug use: No    Review of Systems  Constitutional: No fever/chills. Positive fatigue.  Eyes: No visual changes. ENT: No sore throat. Cardiovascular: Denies chest pain. Positive tachycardia.  Respiratory: Denies shortness of breath. Gastrointestinal: No abdominal pain.  No nausea, no vomiting.  No diarrhea.  No constipation. Poor appetite.  Genitourinary: Negative for dysuria. Musculoskeletal: Negative for back pain. Skin: Negative for rash. Neurological: Negative for headaches, focal weakness or numbness.  10-point ROS otherwise negative.  ____________________________________________   PHYSICAL EXAM:  VITAL SIGNS: ED Triage Vitals  Enc Vitals Group     BP 01/30/20 0717 (!) 157/94     Pulse Rate 01/30/20 0717 88     Resp 01/30/20 0717 16     Temp 01/30/20 0717 98 F (36.7 C)     Temp Source 01/30/20 0717 Oral     SpO2 01/30/20 0717 100 %     Weight 01/30/20 0717 163 lb (73.9 kg)     Height 01/30/20 0717 5\' 7"  (1.702 m)   Constitutional: Alert and oriented. Well appearing and in no acute distress. Eyes: Conjunctivae are normal. Head:  Atraumatic. Nose: No congestion/rhinnorhea. Mouth/Throat: Mucous membranes are moist.  Neck: No stridor. Cardiovascular: Normal rate, regular rhythm. Good peripheral circulation. Grossly normal heart sounds.   Respiratory: Normal respiratory effort.  No retractions. Lungs CTAB. Gastrointestinal: Soft and nontender. No distention.  Musculoskeletal: No gross deformities of extremities. Neurologic:  Normal speech and language. Skin:  Skin is warm, dry and intact. No rash noted.   ____________________________________________   LABS (all labs ordered are listed, but only abnormal results are displayed)  Labs Reviewed  COMPREHENSIVE METABOLIC PANEL - Abnormal; Notable for the following components:      Result Value   Glucose, Bld 107 (*)    BUN 7 (*)    All other components within normal limits  CBC WITH DIFFERENTIAL/PLATELET  TROPONIN I (HIGH SENSITIVITY)   ____________________________________________  EKG   EKG Interpretation  Date/Time:  Monday January 30 2020 07:44:02 EDT Ventricular Rate:  80 PR Interval:    QRS Duration: 104 QT Interval:  404 QTC Calculation: 466 R Axis:   3 Text Interpretation: Sinus rhythm No STEMI Confirmed by Nanda Quinton [phone]) on 01/30/2020 8:06:38 AM       ____________________________________________  RADIOLOGY  None  ____________________________________________   PROCEDURES  Procedure(s) performed:   Procedures  None  ____________________________________________   INITIAL IMPRESSION / ASSESSMENT AND PLAN / ED COURSE  Pertinent labs & imaging results that were available during my care of the patient were reviewed by me and considered in my medical decision making (see chart for details).   Patient presents emergency department with shortness of breath with some tachycardia and associated discomfort.  She has had poor appetite.  She appears well-hydrated overall and heart rate is within normal limits here.  Atypical ACS is  possible this situation although much less likely.  Plan for IV fluids, screening lab work, chest x-ray, reassess.  Doubt PE clinically.  Patient asked to defer CXR pending IVF and reassessment. With clear lungs and normal vitals I agreed to defer.   08:45 AM  Patient feeling much better after IVF. She feels ready for d/c. Discussed ED return precautions and PCP follow up plan.  ____________________________________________  FINAL CLINICAL IMPRESSION(S) / ED DIAGNOSES  Final diagnoses:  SOB (shortness of breath)  Generalized weakness    MEDICATIONS GIVEN DURING THIS VISIT:  Medications  sodium chloride 0.9 % bolus 1,000 mL (1,000 mLs Intravenous New Bag/Given 01/30/20 0753)    Note:  This document was prepared using Dragon voice recognition software and may include unintentional dictation errors.  Nanda Quinton, MD, [facility], [facility], [facility]    Lakea Mittelman, [facility], MD 01/30/20 [phone]

## 2020-01-30 NOTE — ED Triage Notes (Signed)
Shortness of breath, elevated HR of 130 this morning.  Has not been eating and drinking lately.  Pt stated that she is stressed.

## 2020-04-15 ENCOUNTER — Emergency Department (HOSPITAL_BASED_OUTPATIENT_CLINIC_OR_DEPARTMENT_OTHER)
Admission: EM | Admit: 2020-04-15 | Discharge: 2020-04-15 | Disposition: A | Discharge status: Home / Self Care | Payer: Medicare Other | Attending: Emergency Medicine | Admitting: Emergency Medicine

## 2020-04-15 ENCOUNTER — Other Ambulatory Visit: Payer: Self-pay

## 2020-04-15 DIAGNOSIS — Z79899 Other long term (current) drug therapy: Secondary | ICD-10-CM | POA: Insufficient documentation

## 2020-04-15 DIAGNOSIS — J019 Acute sinusitis, unspecified: Secondary | ICD-10-CM | POA: Diagnosis not present

## 2020-04-15 DIAGNOSIS — J329 Chronic sinusitis, unspecified: Secondary | ICD-10-CM

## 2020-04-15 DIAGNOSIS — I10 Essential (primary) hypertension: Secondary | ICD-10-CM | POA: Insufficient documentation

## 2020-04-15 DIAGNOSIS — E860 Dehydration: Secondary | ICD-10-CM | POA: Insufficient documentation

## 2020-04-15 DIAGNOSIS — R5383 Other fatigue: Secondary | ICD-10-CM

## 2020-04-15 LAB — BASIC METABOLIC PANEL
Anion gap: 9 (ref 5–15)
BUN: 14 mg/dL (ref 8–23)
CO2: 25 mmol/L (ref 22–32)
Calcium: 8.9 mg/dL (ref 8.9–10.3)
Chloride: 97 mmol/L — ABNORMAL LOW (ref 98–111)
Creatinine, Ser: 0.78 mg/dL (ref 0.44–1.00)
GFR, Estimated: >60 mL/min (ref >60)
Glucose, Bld: 107 mg/dL — ABNORMAL HIGH (ref 70–99)
Potassium: 3.7 mmol/L (ref 3.5–5.1)
Sodium: 131 mmol/L — ABNORMAL LOW (ref 135–145)

## 2020-04-15 LAB — CBC
HCT: 39.4 % (ref 36.0–46.0)
Hemoglobin: 13.1 g/dL (ref 12.0–15.0)
MCH: 32.6 pg (ref 26.0–34.0)
MCHC: 33.2 g/dL (ref 30.0–36.0)
MCV: 98 fL (ref 80.0–100.0)
Platelets: 278 10*3/uL (ref 150–400)
RBC: 4.02 MIL/uL (ref 3.87–5.11)
RDW: 11.9 % (ref 11.5–15.5)
WBC: 6 10*3/uL (ref 4.0–10.5)
nRBC: 0 % (ref 0.0–0.2)

## 2020-04-15 MED ORDER — AMOXICILLIN-POT CLAVULANATE 875-125 MG PO TABS
1.0000 | ORAL_TABLET | Freq: Two times a day (BID) | ORAL | 0 refills | Status: DC
Start: 2020-04-15 — End: 2020-08-24

## 2020-04-15 MED ORDER — SODIUM CHLORIDE 0.9 % IV BOLUS
1000.0000 mL | Freq: Once | INTRAVENOUS | Status: AC
  Administered 2020-04-15: 1000 mL via INTRAVENOUS

## 2020-04-15 NOTE — Discharge Instructions (Addendum)
Follow up for your infusion treatment this Thursday as scheduled.  Restart the Augmentin for the sinusitis.  Make an appointment to follow-up with your internal medicine doctor.  Return for any new or worse symptoms.

## 2020-04-15 NOTE — ED Triage Notes (Signed)
States has issues with SNS and POTS, sees MD at [facility] for infusions. Is on Beta Blockers. Is now very fatigued, weak, feels like she is dehydrated.

## 2020-04-15 NOTE — ED Provider Notes (Signed)
[facility] EMERGENCY DEPARTMENT Provider Note   CSN: [id number] Arrival date & time: 04/15/20  1925     History Chief Complaint  Patient presents with  . Dehydrated    Katie [name] is a 64 y.o. female.  Patient is followed at [facility] for postural orthostatic tachycardic syndrome.  Dysautonomia.  Fibromyalgia hypertension anxiety history of Lyme's disease mast cell activation syndrome.  Patient normally receives normal saline infusions weekly.  She missed this past week because she was going through training session at work.  Patient normally gets very fatigued starts feeling very dehydrated sweaty she feels here.  Patient also was treated for sinus infection she has frequent sinus problems can only take Augmentin.  Finished up Augmentin on Tuesday.  And feels as if the sinus infection is coming back she would like to be restarted on Augmentin.        Past Medical History:  Diagnosis Date  . Anxiety   . Diverticulitis   . Dysautonomia ([place])   . Fibromyalgia   . Hypertension   . Lyme disease   . Mast cell activation syndrome ([place])   . Osteopenia 05/2017   T score -2.2 FRAX 11% / 1.7%  . POTS (postural orthostatic tachycardia syndrome)     Patient Active Problem List   Diagnosis Date Noted  . Dysautonomia ([place])   . POTS (postural orthostatic tachycardia syndrome)   . Hypothyroidism   . Allergic rhinitis   . Diarrhea 11/14/2015  . Acute gastroenteritis 11/14/2015  . Dehydration   . Enteritis 11/13/2015  . Hyponatremia 11/13/2015  . Elevated blood pressure reading 07/14/2013  . Lyme disease 10/25/2012  . Chronic fatigue syndrome 10/25/2012  . Anxiety 10/25/2012  . Fibromyalgia 10/25/2012  . Hormone replacement therapy - followed by Dr. Lonia Skinner in gyn and by [place] [facility] 10/25/2012    Past Surgical History:  Procedure Laterality Date  . Cataract Removal With Lens Implant Placement Left 03-06-2015  . CERVICAL  BIOPSY  W/ LOOP ELECTRODE EXCISION  04/19/2004   HGSIL CIN II  MARGINS NEGATIVE  . COLPOSCOPY  02/19/2004   HGSIL CIN II     OB History    Gravida  2   Para  2   Term      Preterm      AB      Living  2     SAB      IAB      Ectopic      Multiple      Live Births              Family History  Problem Relation Age of Onset  . Heart disease Mother        CHF  . Hypertension Mother   . Cancer Father 15       lung cancer  . Hypertension Father   . Hypertension Paternal Grandmother   . Stroke Paternal Grandmother   . Hypertension Paternal Grandfather   . Mental retardation Son     Social History   Tobacco Use  . Smoking status: Never Smoker  . Smokeless tobacco: Never Used  Vaping Use  . Vaping Use: Never used  Substance Use Topics  . Alcohol use: Yes    Comment: occ  . Drug use: No    Home Medications Prior to Admission medications   Medication Sig Start Date End Date Taking? Authorizing Provider  ALPRAZolam Duanne Moron) 0.5 MG tablet Take 0.5 mg by mouth  3 (three) times daily as needed. 09/12/19   [provider]  amoxicillin-clavulanate (AUGMENTIN) 875-125 MG tablet Take 1 tablet by mouth every 12 (twelve) hours. 04/15/20   Fredia Sorrow, MD  azelastine (ASTELIN) 0.1 % nasal spray 1 spray by Nasal route 2 times daily. Use in each nostril as directed    [provider]  cetirizine (ZYRTEC ALLERGY) 10 MG tablet Take 10 mg by mouth daily.    [provider]  Cholecalciferol (VITAMIN D-3) 5000 UNITS TABS Take 5,000 tablets by mouth daily.    [provider]  diphenhydrAMINE (BENADRYL ALLERGY) 25 MG tablet Take 25 mg by mouth nightly as needed for Itching.    [provider]  levothyroxine (SYNTHROID) 25 MCG tablet Take 25 mcg by mouth daily. 09/27/19   [provider]  metoprolol succinate (TOPROL-XL) 25 MG 24 hr tablet Take 1 tablet (25 mg total) by mouth daily. 1 TABLET BY MOUTH 4 TIMES A DAY AS  NEEDED 10/12/19   Loletha Grayer, MD  montelukast (SINGULAIR) 10 MG tablet Take 10 mg by mouth at bedtime.    [provider]  Multiple Vitamin (MULTI-VITAMIN) tablet Take by mouth.    [provider]  omeprazole (PRILOSEC OTC) 20 MG tablet Take 20 mg by mouth daily.     [provider]  ondansetron (ZOFRAN) 4 MG tablet Take 1 tablet (4 mg total) by mouth every 6 (six) hours as needed for nausea. 10/12/19   Loletha Grayer, MD  Probiotic Product (PROBIOTIC ADVANCED PO) Take 1 capsule by mouth 2 (two) times daily. 100 BILLION    [provider]  traMADol (ULTRAM) 50 MG tablet tramadol 50 mg tablet 05/19/19   [provider]    Allergies    Epinephrine, Isovue [iopamidol], Azithromycin, Ciprofloxacin, Flagyl [metronidazole], Prednisone, Sulfa antibiotics, Sulfacetamide sodium, and Wheat bran  Review of Systems   Review of Systems  Constitutional: Positive for fatigue. Negative for chills and fever.  HENT: Positive for sinus pressure. Negative for congestion, rhinorrhea and sore throat.   Eyes: Negative for visual disturbance.  Respiratory: Negative for cough and shortness of breath.   Cardiovascular: Negative for chest pain and leg swelling.  Gastrointestinal: Negative for abdominal pain, diarrhea, nausea and vomiting.  Genitourinary: Negative for dysuria.  Musculoskeletal: Negative for back pain and neck pain.  Skin: Negative for rash.  Neurological: Positive for weakness. Negative for dizziness, light-headedness and headaches.  Hematological: Does not bruise/bleed easily.  Psychiatric/Behavioral: Negative for confusion.    Physical Exam Updated Vital Signs BP (!) 168/88 (BP Location: Right Arm)   Pulse 75   Temp (!) 97.5 F (36.4 C) (Oral)   Resp 18   Ht 1.702 m (5\' 7" )   Wt 77.1 kg   LMP 03/15/2013   SpO2 99%   BMI 26.63 kg/m   Physical Exam Vitals and nursing note reviewed.  Constitutional:      General: She is not in acute  distress.    Appearance: Normal appearance. She is well-developed and well-nourished.  HENT:     Head: Normocephalic and atraumatic.  Eyes:     Extraocular Movements: Extraocular movements intact.     Conjunctiva/sclera: Conjunctivae normal.     Pupils: Pupils are equal, round, and reactive to light.  Cardiovascular:     Rate and Rhythm: Normal rate and regular rhythm.     Heart sounds: No murmur heard.   Pulmonary:     Effort: Pulmonary effort is normal. No respiratory distress.  Breath sounds: Normal breath sounds.  Abdominal:     Palpations: Abdomen is soft.     Tenderness: There is no abdominal tenderness.  Musculoskeletal:        General: No edema. Normal range of motion.     Cervical back: Normal range of motion and neck supple.  Skin:    General: Skin is warm and dry.     Capillary Refill: Capillary refill takes less than 2 seconds.  Neurological:     General: No focal deficit present.     Mental Status: She is alert and oriented to person, place, and time.     Cranial Nerves: No cranial nerve deficit.     Sensory: No sensory deficit.     Motor: No weakness.  Psychiatric:        Mood and Affect: Mood and affect normal.     ED Results / Procedures / Treatments   Labs (all labs ordered are listed, but only abnormal results are displayed) Labs Reviewed  BASIC METABOLIC PANEL - Abnormal; Notable for the following components:      Result Value   Sodium 131 (*)    Chloride 97 (*)    Glucose, Bld 107 (*)    All other components within normal limits  CBC    EKG None  Radiology No results found.  Procedures Procedures (including critical care time)  Medications Ordered in ED Medications  sodium chloride 0.9 % bolus 1,000 mL (1,000 mLs Intravenous New Bag/Given 04/15/20 2056)    ED Course  I have reviewed the triage vital signs and the nursing notes.  Pertinent labs & imaging results that were available during my care of the patient were reviewed by  me and considered in my medical decision making (see chart for details).    MDM Rules/Calculators/A&P                          Patient feels much better after 1 L of normal saline.  Will [place] restart patient back on Augmentin.  States she cannot take any other antibiotic other than that.  She feels as if she is having a recurrent sinus infection.  Patient nontoxic no acute distress.  She is scheduled for normal saline infusion on Thursday at [facility] is infusion center.  She will return for any new or worse symptoms.     Final Clinical Impression(s) / ED Diagnoses Final diagnoses:  Fatigue, unspecified type  Dehydration  Sinusitis, unspecified chronicity, unspecified location    Rx / DC Orders ED Discharge Orders         Ordered    amoxicillin-clavulanate (AUGMENTIN) 875-125 MG tablet  Every 12 hours        04/15/20 2206           Fredia Sorrow, MD 04/15/20 2211

## 2020-05-13 ENCOUNTER — Encounter (HOSPITAL_BASED_OUTPATIENT_CLINIC_OR_DEPARTMENT_OTHER): Payer: Self-pay

## 2020-05-13 ENCOUNTER — Other Ambulatory Visit: Payer: Self-pay

## 2020-05-13 ENCOUNTER — Emergency Department (HOSPITAL_BASED_OUTPATIENT_CLINIC_OR_DEPARTMENT_OTHER)
Admission: EM | Admit: 2020-05-13 | Discharge: 2020-05-13 | Disposition: A | Discharge status: Home / Self Care | Payer: Medicare Other | Attending: Emergency Medicine | Admitting: Emergency Medicine

## 2020-05-13 DIAGNOSIS — J0101 Acute recurrent maxillary sinusitis: Secondary | ICD-10-CM | POA: Insufficient documentation

## 2020-05-13 DIAGNOSIS — U071 COVID-19: Secondary | ICD-10-CM | POA: Insufficient documentation

## 2020-05-13 DIAGNOSIS — Z20822 Contact with and (suspected) exposure to covid-19: Secondary | ICD-10-CM

## 2020-05-13 DIAGNOSIS — E039 Hypothyroidism, unspecified: Secondary | ICD-10-CM | POA: Insufficient documentation

## 2020-05-13 DIAGNOSIS — Z79899 Other long term (current) drug therapy: Secondary | ICD-10-CM | POA: Diagnosis not present

## 2020-05-13 DIAGNOSIS — R519 Headache, unspecified: Secondary | ICD-10-CM | POA: Diagnosis present

## 2020-05-13 LAB — COMPREHENSIVE METABOLIC PANEL
ALT: 18 U/L (ref 0–44)
AST: 24 U/L (ref 15–41)
Albumin: 3.7 g/dL (ref 3.5–5.0)
Alkaline Phosphatase: 83 U/L (ref 38–126)
Anion gap: 9 (ref 5–15)
BUN: 7 mg/dL — ABNORMAL LOW (ref 8–23)
CO2: 26 mmol/L (ref 22–32)
Calcium: 8.8 mg/dL — ABNORMAL LOW (ref 8.9–10.3)
Chloride: 102 mmol/L (ref 98–111)
Creatinine, Ser: 0.71 mg/dL (ref 0.44–1.00)
GFR, Estimated: >60 mL/min (ref >60)
Glucose, Bld: 107 mg/dL — ABNORMAL HIGH (ref 70–99)
Potassium: 3.9 mmol/L (ref 3.5–5.1)
Sodium: 137 mmol/L (ref 135–145)
Total Bilirubin: 0.7 mg/dL (ref 0.3–1.2)
Total Protein: 7.1 g/dL (ref 6.5–8.1)

## 2020-05-13 LAB — CBC WITH DIFFERENTIAL/PLATELET
Abs Immature Granulocytes: 0.01 10*3/uL (ref 0.00–0.07)
Basophils Absolute: 0 10*3/uL (ref 0.0–0.1)
Basophils Relative: 1 %
Eosinophils Absolute: 0.1 10*3/uL (ref 0.0–0.5)
Eosinophils Relative: 1 %
HCT: 38.5 % (ref 36.0–46.0)
Hemoglobin: 12.9 g/dL (ref 12.0–15.0)
Immature Granulocytes: 0 %
Lymphocytes Relative: 20 %
Lymphs Abs: 1 10*3/uL (ref 0.7–4.0)
MCH: 32.7 pg (ref 26.0–34.0)
MCHC: 33.5 g/dL (ref 30.0–36.0)
MCV: 97.7 fL (ref 80.0–100.0)
Monocytes Absolute: 0.7 10*3/uL (ref 0.1–1.0)
Monocytes Relative: 14 %
Neutro Abs: 3.1 10*3/uL (ref 1.7–7.7)
Neutrophils Relative %: 64 %
Platelets: 219 10*3/uL (ref 150–400)
RBC: 3.94 MIL/uL (ref 3.87–5.11)
RDW: 12.2 % (ref 11.5–15.5)
WBC: 4.9 10*3/uL (ref 4.0–10.5)
nRBC: 0 % (ref 0.0–0.2)

## 2020-05-13 LAB — SARS CORONAVIRUS 2 (TAT 6-24 HRS): SARS Coronavirus 2: POSITIVE — AB

## 2020-05-13 MED ORDER — SODIUM CHLORIDE 0.9 % IV BOLUS
1000.0000 mL | Freq: Once | INTRAVENOUS | Status: AC
  Administered 2020-05-13: 1000 mL via INTRAVENOUS

## 2020-05-13 NOTE — ED Triage Notes (Signed)
Pt also reports that her PCP called in antibiotic for her for chronic sinus infections. Also reports some diarrhea but unsure if that is related to the antibiotic.

## 2020-05-13 NOTE — ED Notes (Signed)
ED Provider at bedside. 

## 2020-05-13 NOTE — ED Notes (Signed)
Pt ambulated to room from triage, no s/s of distress

## 2020-05-13 NOTE — ED Triage Notes (Signed)
arrvies with c/o headache and congestion since Friday states that she was Covid tested on Friday and it was negative. Pt is not vaccinated.

## 2020-05-13 NOTE — ED Notes (Signed)
Pt ambulatory with steady gait to restroom 

## 2020-05-13 NOTE — ED Provider Notes (Signed)
Katie [name]   CSN: [id number] Arrival date & time: 05/13/20  8144     History Chief Complaint  Patient presents with  . Headache    Katie [name] is a 65 y.o. female.  HPI      Woke up Friday AM feeling badly, temperature runs lower, but felt a little feverish, like 100.0 This AM yellow and blood came out when blowing nose Had headache, typical sinus stuff Working a lot of hours, run down Friday night started antibiotics Hx of dysautonomia and was BP 174 this AM Hx of low sodium, hoping to get some fluids and test No cough, shortness of breath, nausea, vomiting Diarrhea this AM, not sure if abx Not vaccinated due to autoimmune probs, stay home, very careful, no known sick contacts Pulse ox ok   Past Medical History:  Diagnosis Date  . Anxiety   . Diverticulitis   . Dysautonomia ([place])   . Fibromyalgia   . Hypertension   . Lyme disease   . Mast cell activation syndrome ([place])   . Osteopenia 05/2017   T score -2.2 FRAX 11% / 1.7%  . POTS (postural orthostatic tachycardia syndrome)     Patient Active Problem List   Diagnosis Date Noted  . Dysautonomia ([place])   . POTS (postural orthostatic tachycardia syndrome)   . Hypothyroidism   . Allergic rhinitis   . Diarrhea 11/14/2015  . Acute gastroenteritis 11/14/2015  . Dehydration   . Enteritis 11/13/2015  . Hyponatremia 11/13/2015  . Elevated blood pressure reading 07/14/2013  . Lyme disease 10/25/2012  . Chronic fatigue syndrome 10/25/2012  . Anxiety 10/25/2012  . Fibromyalgia 10/25/2012  . Hormone replacement therapy - followed by Dr. Lonia Skinner in gyn and by [place] 10/25/2012    Past Surgical History:  Procedure Laterality Date  . Cataract Removal With Lens Implant Placement Left 03-06-2015  . CERVICAL BIOPSY  W/ LOOP ELECTRODE EXCISION  04/19/2004   HGSIL CIN II  MARGINS NEGATIVE  . COLPOSCOPY  02/19/2004   HGSIL CIN II     OB History     Gravida  2   Para  2   Term      Preterm      AB      Living  2     SAB      IAB      Ectopic      Multiple      Live Births              Family History  Problem Relation Age of Onset  . Heart disease Mother        CHF  . Hypertension Mother   . Cancer Father 92       lung cancer  . Hypertension Father   . Hypertension Paternal Grandmother   . Stroke Paternal Grandmother   . Hypertension Paternal Grandfather   . Mental retardation Son     Social History   Tobacco Use  . Smoking status: Never Smoker  . Smokeless tobacco: Never Used  Vaping Use  . Vaping Use: Never used  Substance Use Topics  . Alcohol use: Yes    Comment: occ  . Drug use: No    Home Medications Prior to Admission medications   Medication Sig Start Date End Date Taking? Authorizing Provider  ALPRAZolam Duanne Moron) 0.5 MG tablet Take 0.5 mg by mouth 3 (three) times daily as needed. 09/12/19   [provider]  amoxicillin-clavulanate (AUGMENTIN) 875-125 MG tablet Take 1 tablet by mouth every 12 (twelve) hours. 04/15/20   Fredia Sorrow, MD  azelastine (ASTELIN) 0.1 % nasal spray 1 spray by Nasal route 2 times daily. Use in each nostril as directed    [provider]  cetirizine (ZYRTEC ALLERGY) 10 MG tablet Take 10 mg by mouth daily.    [provider]  Cholecalciferol (VITAMIN D-3) 5000 UNITS TABS Take 5,000 tablets by mouth daily.    [provider]  diphenhydrAMINE (BENADRYL ALLERGY) 25 MG tablet Take 25 mg by mouth nightly as needed for Itching.    [provider]  levothyroxine (SYNTHROID) 25 MCG tablet Take 25 mcg by mouth daily. 09/27/19   [provider]  metoprolol succinate (TOPROL-XL) 25 MG 24 hr tablet Take 1 tablet (25 mg total) by mouth daily. 1 TABLET BY MOUTH 4 TIMES A DAY AS NEEDED 10/12/19   Loletha Grayer, MD  montelukast (SINGULAIR) 10 MG tablet Take 10 mg by mouth at bedtime.    [provider]  Multiple  Vitamin (MULTI-VITAMIN) tablet Take by mouth.    [provider]  omeprazole (PRILOSEC OTC) 20 MG tablet Take 20 mg by mouth daily.     [provider]  ondansetron (ZOFRAN) 4 MG tablet Take 1 tablet (4 mg total) by mouth every 6 (six) hours as needed for nausea. 10/12/19   Loletha Grayer, MD  Probiotic Product (PROBIOTIC ADVANCED PO) Take 1 capsule by mouth 2 (two) times daily. 100 BILLION    [provider]  traMADol (ULTRAM) 50 MG tablet tramadol 50 mg tablet 05/19/19   [provider]    Allergies    Epinephrine, Isovue [iopamidol], Sulfa antibiotics, Acidophilus (probiotic) [lactase-lactobacillus], Azithromycin, Ciprofloxacin, Flagyl [metronidazole], Prednisone, Sulfacetamide sodium, and Wheat bran  Review of Systems   Review of Systems  Constitutional: Positive for appetite change, fatigue and fever.  HENT: Positive for congestion. Negative for sore throat.   Eyes: Negative for visual disturbance.  Respiratory: Negative for cough and shortness of breath.   Cardiovascular: Negative for chest pain.  Gastrointestinal: Positive for diarrhea. Negative for abdominal pain and vomiting.  Musculoskeletal: Negative for arthralgias and myalgias.  Skin: Negative for rash.  Neurological: Positive for headaches. Negative for facial asymmetry, speech difficulty, weakness and numbness.    Physical Exam Updated Vital Signs BP (!) 162/96 (BP Location: Left Arm)   Pulse 73   Temp (S) 98.9 F (37.2 C) (Oral) Comment: pt did have Nyquil around 6 am today.  Resp 18   Ht 5\' 7"  (1.702 m)   Wt 73.5 kg   LMP 03/15/2013   SpO2 100%   BMI 25.37 kg/m   Physical Exam Vitals and nursing [name] reviewed.  Constitutional:      General: She is not in acute distress.    Appearance: She is well-developed and well-nourished. She is not diaphoretic.  HENT:     Head: Normocephalic and atraumatic.  Eyes:     Extraocular Movements: EOM normal.     Conjunctiva/sclera:  Conjunctivae normal.  Cardiovascular:     Rate and Rhythm: Normal rate and regular rhythm.     Pulses: Intact distal pulses.     Heart sounds: Normal heart sounds. No murmur heard. No friction rub. No gallop.   Pulmonary:     Effort: Pulmonary effort is normal. No respiratory distress.     Breath sounds: Normal breath sounds. No wheezing or rales.  Abdominal:     General: There is no distension.  Palpations: Abdomen is soft.     Tenderness: There is no abdominal tenderness. There is no guarding.  Musculoskeletal:        General: No tenderness or edema.     Cervical back: Normal range of motion.  Skin:    General: Skin is warm and dry.     Findings: No erythema or rash.  Neurological:     Mental Status: She is alert and oriented to person, place, and time.     ED Results / Procedures / Treatments   Labs (all labs ordered are listed, but only abnormal results are displayed) Labs Reviewed  SARS CORONAVIRUS 2 (TAT 6-24 HRS) - Abnormal; Notable for the following components:      Result Value   SARS Coronavirus 2 POSITIVE (*)    All other components within normal limits  COMPREHENSIVE METABOLIC PANEL - Abnormal; Notable for the following components:   Glucose, Bld 107 (*)    BUN 7 (*)    Calcium 8.8 (*)    All other components within normal limits  CBC WITH DIFFERENTIAL/PLATELET    EKG None  Radiology No results found.  Procedures Procedures (including critical care time)  Medications Ordered in ED Medications  sodium chloride 0.9 % bolus 1,000 mL (0 mLs Intravenous Stopped 05/13/20 0935)    ED Course  I have reviewed the triage vital signs and the nursing notes.  Pertinent labs & imaging results that were available during my care of the patient were reviewed by me and considered in my medical decision making (see chart for details).    MDM Rules/Calculators/A&P                          65yo female with history above including hx of hyponatremia,  dysautonomia, mast cell activation syndrome, POTS, presents with concern for headache, congestion, fatigue since Friday.  Labs show no sign of anemia or significant electrolyte abnormality. Given IV fluids for rehydration with decreased po intake.  Headache began slowly, no trauma, no associated neurologic symptoms and have low suspicion for [facility], SDH or meningitis.  Is being treated for bacterial sinusitis by PCP but concern remains for viral URI including COVID 19. No hypoxia or indication for admission for COVID.  6-24hr PCR testing sent and after discharge is positive. Limited efficacy of MAB in setting of omicron, and suspect higher risk given MCAS hx. Had discussed PCP follow up and supportive care/quarantine if positive.    Final Clinical Impression(s) / ED Diagnoses Final diagnoses:  Acute recurrent maxillary sinusitis  COVID-19    Rx / DC Orders ED Discharge Orders    None       Gareth Morgan, MD 05/14/20 [phone]

## 2020-05-14 ENCOUNTER — Encounter: Payer: Self-pay | Admitting: Oncology

## 2020-05-14 ENCOUNTER — Other Ambulatory Visit: Payer: Self-pay | Admitting: Oncology

## 2020-05-14 ENCOUNTER — Ambulatory Visit (HOSPITAL_COMMUNITY)
Admission: RE | Admit: 2020-05-14 | Discharge: 2020-05-14 | Disposition: A | Discharge status: Home / Self Care | Payer: Medicare Other | Source: Ambulatory Visit | Attending: Pulmonary Disease | Admitting: Pulmonary Disease

## 2020-05-14 ENCOUNTER — Telehealth: Payer: Self-pay | Admitting: *Deleted

## 2020-05-14 DIAGNOSIS — U071 COVID-19: Secondary | ICD-10-CM

## 2020-05-14 DIAGNOSIS — G90A Postural orthostatic tachycardia syndrome (POTS): Secondary | ICD-10-CM

## 2020-05-14 DIAGNOSIS — I498 Other specified cardiac arrhythmias: Secondary | ICD-10-CM

## 2020-05-14 MED ORDER — EPINEPHRINE 0.3 MG/0.3ML IJ SOAJ: 0.3000 mg | Freq: Once | INTRAMUSCULAR | Status: DC | PRN

## 2020-05-14 MED ORDER — SODIUM CHLORIDE 0.9 % IV SOLN: Freq: Once | INTRAVENOUS | Status: AC

## 2020-05-14 MED ORDER — METHYLPREDNISOLONE SODIUM SUCC 125 MG IJ SOLR: 125.0000 mg | Freq: Once | INTRAMUSCULAR | Status: DC | PRN

## 2020-05-14 MED ORDER — SODIUM CHLORIDE 0.9 % IV SOLN: INTRAVENOUS | Status: DC | PRN

## 2020-05-14 MED ORDER — DIPHENHYDRAMINE HCL 50 MG/ML IJ SOLN: 50.0000 mg | Freq: Once | INTRAMUSCULAR | Status: DC | PRN

## 2020-05-14 MED ORDER — ALBUTEROL SULFATE HFA 108 (90 BASE) MCG/ACT IN AERS: 2.0000 | INHALATION_SPRAY | Freq: Once | RESPIRATORY_TRACT | Status: DC | PRN

## 2020-05-14 MED ORDER — FAMOTIDINE IN NACL 20-0.9 MG/50ML-% IV SOLN: 20.0000 mg | Freq: Once | INTRAVENOUS | Status: DC | PRN

## 2020-05-14 MED ORDER — SOTROVIMAB 500 MG/8ML IV SOLN
500.0000 mg | Freq: Once | INTRAVENOUS | Status: AC
  Administered 2020-05-14: 500 mg via INTRAVENOUS

## 2020-05-14 NOTE — Progress Notes (Signed)
Patient reviewed Fact Sheet for Patients, Parents, and Caregivers for Emergency Use Authorization (EUA) of sotrovimab for the Treatment of Coronavirus. Patient also reviewed and is agreeable to the estimated cost of treatment. Patient is agreeable to proceed.   

## 2020-05-14 NOTE — Discharge Instructions (Signed)
10 Things You Can Do to Manage Your COVID-19 Symptoms at Home If you have possible or confirmed COVID-19: 1. Stay home except to get medical care. 2. Monitor your symptoms carefully. If your symptoms get worse, call your healthcare provider immediately. 3. Get rest and stay hydrated. 4. If you have a medical appointment, call the healthcare provider ahead of time and tell them that you have or may have COVID-19. 5. For medical emergencies, call 911 and notify the dispatch personnel that you have or may have COVID-19. 6. Cover your cough and sneezes with a tissue or use the inside of your elbow. 7. Wash your hands often with soap and water for at least 20 seconds or clean your hands with an alcohol-based hand sanitizer that contains at least 60% alcohol. 8. As much as possible, stay in a specific room and away from other people in your home. Also, you should use a separate bathroom, if available. If you need to be around other people in or outside of the home, wear a mask. 9. Avoid sharing personal items with other people in your household, like dishes, towels, and bedding. 10. Clean all surfaces that are touched often, like counters, tabletops, and doorknobs. Use household cleaning sprays or wipes according to the label instructions. cdc.gov/coronavirus 11/18/2019 This information is not intended to replace advice given to you by your health care provider. Make sure you discuss any questions you have with your health care provider. Document Revised: 03/05/2020 Document Reviewed: 03/05/2020 Elsevier Patient Education  2021 Elsevier Inc.  What types of side effects do monoclonal antibody drugs cause?  Common side effects  In general, the more common side effects caused by monoclonal antibody drugs include: . Allergic reactions, such as hives or itching . Flu-like signs and symptoms, including chills, fatigue, fever, and muscle aches and pains . Nausea, vomiting . Diarrhea . Skin  rashes . Low blood pressure   The CDC is recommending patients who receive monoclonal antibody treatments wait at least 90 days before being vaccinated.  Currently, there are no data on the safety and efficacy of mRNA COVID-19 vaccines in persons who received monoclonal antibodies or convalescent plasma as part of COVID-19 treatment. Based on the estimated half-life of such therapies as well as evidence suggesting that reinfection is uncommon in the 90 days after initial infection, vaccination should be deferred for at least 90 days, as a precautionary measure until additional information becomes available, to avoid interference of the antibody treatment with vaccine-induced immune responses.  If you have any questions or concerns after the infusion please call the Advanced Practice Provider on call at [phone]. This number is ONLY intended for your use regarding questions or concerns about the infusion post-treatment side-effects.  Please do not provide this number to others for use. For return to work notes please contact your primary care provider.   If someone you know is interested in receiving treatment please have them call the COVID hotline at [phone].    

## 2020-05-14 NOTE — Progress Notes (Signed)
I connected by phone with Katie [name] on 05/14/2020 at 1:02 PM to discuss the potential use of a new treatment for mild to moderate COVID-19 viral infection in non-hospitalized patients.  This patient is a 65 y.o. female that meets the FDA criteria for Emergency Use Authorization of COVID monoclonal antibody sotrovimab.  Has a (+) direct SARS-CoV-2 viral test result  Has mild or moderate COVID-19   Is NOT hospitalized due to COVID-19  Is within 10 days of symptom onset  Has at least one of the high risk factor(s) for progression to severe COVID-19 and/or hospitalization as defined in EUA.  Specific high risk criteria : Older age (>/= 66 yo), Immunosuppressive Disease or Treatment and Other high risk medical condition per CDC:  unvaccinated   I have spoken and communicated the following to the patient or parent/caregiver regarding COVID monoclonal antibody treatment:  1. FDA has authorized the emergency use for the treatment of mild to moderate COVID-19 in adults and pediatric patients with positive results of direct SARS-CoV-2 viral testing who are 61 years of age and older weighing at least 40 kg, and who are at high risk for progressing to severe COVID-19 and/or hospitalization.  2. The significant known and potential risks and benefits of COVID monoclonal antibody, and the extent to which such potential risks and benefits are unknown.  3. Information on available alternative treatments and the risks and benefits of those alternatives, including clinical trials.  4. Patients treated with COVID monoclonal antibody should continue to self-isolate and use infection control measures (e.g., wear mask, isolate, social distance, avoid sharing personal items, clean and disinfect "high touch" surfaces, and frequent handwashing) according to CDC guidelines.   5. The patient or parent/caregiver has the option to accept or refuse COVID monoclonal antibody treatment.  After reviewing this  information with the patient, the patient has agreed to receive one of the available covid 19 monoclonal antibodies and will be provided an appropriate fact sheet prior to infusion. Jacquelin Hawking, NP 05/14/2020 1:02 PM

## 2020-05-14 NOTE — Telephone Encounter (Signed)
Opened in error. Please disregard encounter.

## 2020-05-14 NOTE — Progress Notes (Signed)
Diagnosis: COVID-19  Physician: Dr. Patrick Wright  Procedure: Covid Infusion Clinic Med: Sotrovimab infusion - Provided patient with sotrovimab fact sheet for patients, parents, and caregivers prior to infusion.   Complications: No immediate complications noted  Discharge: Discharged home    

## 2020-05-15 ENCOUNTER — Encounter: Payer: Self-pay | Admitting: Nurse Practitioner

## 2020-05-15 ENCOUNTER — Encounter: Payer: Self-pay | Admitting: Oncology

## 2020-05-15 ENCOUNTER — Telehealth (HOSPITAL_COMMUNITY): Payer: Self-pay | Admitting: Nurse Practitioner

## 2020-05-15 NOTE — Telephone Encounter (Signed)
On call provider notified by the mAb infusion clinic RN that patient contacted the clinic with concerns of blood pressure changes today that resulted in her being unable to work virtually. Patient requires work note with return to work date.  RN reports that patient has a history of POTS.  Unable to determine if blood pressure changes are related to COVID-19 viral infection, POTS history, or recent mAb infusion.   Also unable to determine how long symptoms may continue. Will provide work note detailing COVID-19 infection with request for allowance for 10 days of rest and recovery from date of symptom onset based on COVID-19 quarantine guidelines and typical length of significant illness symptoms.   Mychart message sent to patient recommending follow-up with PCP for recent blood pressure changes in addition to rest and recovery from COVID-19 infection.   If additional time from work is needed, patient will need to follow-up with PCP for further evaluation.

## 2020-05-16 NOTE — Telephone Encounter (Signed)
Can we print the last letter from Katie [name] and email to patient at email provided?

## 2020-05-16 NOTE — Telephone Encounter (Signed)
Being emailed to patient by Elmo Putt

## 2020-06-24 ENCOUNTER — Other Ambulatory Visit: Payer: Self-pay

## 2020-06-24 ENCOUNTER — Emergency Department (HOSPITAL_BASED_OUTPATIENT_CLINIC_OR_DEPARTMENT_OTHER): Payer: Medicare Other

## 2020-06-24 ENCOUNTER — Encounter (HOSPITAL_BASED_OUTPATIENT_CLINIC_OR_DEPARTMENT_OTHER): Payer: Self-pay | Admitting: Emergency Medicine

## 2020-06-24 ENCOUNTER — Emergency Department (HOSPITAL_BASED_OUTPATIENT_CLINIC_OR_DEPARTMENT_OTHER)
Admission: EM | Admit: 2020-06-24 | Discharge: 2020-06-24 | Disposition: A | Discharge status: Home / Self Care | Payer: Medicare Other | Attending: Emergency Medicine | Admitting: Emergency Medicine

## 2020-06-24 DIAGNOSIS — R0602 Shortness of breath: Secondary | ICD-10-CM

## 2020-06-24 DIAGNOSIS — Z79899 Other long term (current) drug therapy: Secondary | ICD-10-CM | POA: Insufficient documentation

## 2020-06-24 DIAGNOSIS — R110 Nausea: Secondary | ICD-10-CM | POA: Diagnosis not present

## 2020-06-24 DIAGNOSIS — E039 Hypothyroidism, unspecified: Secondary | ICD-10-CM | POA: Insufficient documentation

## 2020-06-24 DIAGNOSIS — I10 Essential (primary) hypertension: Secondary | ICD-10-CM | POA: Insufficient documentation

## 2020-06-24 LAB — BASIC METABOLIC PANEL
Anion gap: 9 (ref 5–15)
BUN: 8 mg/dL (ref 8–23)
CO2: 27 mmol/L (ref 22–32)
Calcium: 9 mg/dL (ref 8.9–10.3)
Chloride: 102 mmol/L (ref 98–111)
Creatinine, Ser: 0.7 mg/dL (ref 0.44–1.00)
GFR, Estimated: >60 mL/min (ref >60)
Glucose, Bld: 95 mg/dL (ref 70–99)
Potassium: 3.6 mmol/L (ref 3.5–5.1)
Sodium: 138 mmol/L (ref 135–145)

## 2020-06-24 LAB — CBC
HCT: 41.2 % (ref 36.0–46.0)
Hemoglobin: 13.6 g/dL (ref 12.0–15.0)
MCH: 33.3 pg (ref 26.0–34.0)
MCHC: 33 g/dL (ref 30.0–36.0)
MCV: 100.7 fL — ABNORMAL HIGH (ref 80.0–100.0)
Platelets: 270 10*3/uL (ref 150–400)
RBC: 4.09 MIL/uL (ref 3.87–5.11)
RDW: 12.2 % (ref 11.5–15.5)
WBC: 4.9 10*3/uL (ref 4.0–10.5)
nRBC: 0 % (ref 0.0–0.2)

## 2020-06-24 MED ORDER — SODIUM CHLORIDE 0.9 % IV BOLUS
1000.0000 mL | Freq: Once | INTRAVENOUS | Status: AC
  Administered 2020-06-24: 1000 mL via INTRAVENOUS

## 2020-06-24 NOTE — ED Provider Notes (Signed)
[facility]ERGENC[facility] DEPARTMENT Provider Note   CSN: [id number] Arrival date & time: 06/24/20  1834     History Chief Complaint  Patient presents with  . Shortness of Breath    Katie [name] is a 65 y.o. female.  HPI 65 year old female with a history of dysautonomia, fibromyalgia, hypertension, Lyme disease, pots, mast cell activation syndrome presents to the ER with complaints of shortness of breath, some nausea.  Patient states that she feels as though her sodium might be low as this happens with her dysautonomia.  She states that she requires weekly IV fluids, states that she got an infusion on Monday, but started to feel some nasal congestion over the last few days.  She was seen at urgent care and was started on Augmentin.  She states that she has been under a tremendous amount of stress as her husband is at [facility] currently with cancer and pneumonia.  She noticed that today she was feeling pretty short of breath after doing some housework.  She denies any cough, fevers or chills.  She wanted to make sure that she ruled out a pneumonia, but generally states that this is how she feels when she needs fluids, requesting these today.  Denies any chest pain.  Reports that she had Covid back in January.    Past Medical History:  Diagnosis Date  . Anxiety   . Diverticulitis   . Dysautonomia ([place])   . Fibromyalgia   . Hypertension   . Lyme disease   . Mast cell activation syndrome ([place])   . Osteopenia 05/2017   T score -2.2 FRAX 11% / 1.7%  . POTS (postural orthostatic tachycardia syndrome)     Patient Active Problem List   Diagnosis Date Noted  . Dysautonomia ([place])   . POTS (postural orthostatic tachycardia syndrome)   . Hypothyroidism   . Allergic rhinitis   . Diarrhea 11/14/2015  . Acute gastroenteritis 11/14/2015  . Dehydration   . Enteritis 11/13/2015  . Hyponatremia 11/13/2015  . Elevated blood pressure reading 07/14/2013  . Lyme disease  10/25/2012  . Chronic fatigue syndrome 10/25/2012  . Anxiety 10/25/2012  . Fibromyalgia 10/25/2012  . Hormone replacement therapy - followed by Dr. Lonia Skinner in gyn and by [place] 10/25/2012    Past Surgical History:  Procedure Laterality Date  . Cataract Removal With Lens Implant Placement Left 03-06-2015  . CERVICAL BIOPSY  W/ LOOP ELECTRODE EXCISION  04/19/2004   HGSIL CIN II  MARGINS NEGATIVE  . COLPOSCOPY  02/19/2004   HGSIL CIN II     OB History    Gravida  2   Para  2   Term      Preterm      AB      Living  2     SAB      IAB      Ectopic      Multiple      Live Births              Family History  Problem Relation Age of Onset  . Heart disease Mother        CHF  . Hypertension Mother   . Cancer Father 12       lung cancer  . Hypertension Father   . Hypertension Paternal Grandmother   . Stroke Paternal Grandmother   . Hypertension Paternal Grandfather   . Mental retardation Son     Social History  Tobacco Use  . Smoking status: Never Smoker  . Smokeless tobacco: Never Used  Vaping Use  . Vaping Use: Never used  Substance Use Topics  . Alcohol use: Yes    Comment: occ  . Drug use: No    Home Medications Prior to Admission medications   Medication Sig Start Date End Date Taking? Authorizing Provider  ALPRAZolam Duanne Moron) 0.5 MG tablet Take 0.5 mg by mouth 3 (three) times daily as needed. 09/12/19   [provider]  amoxicillin-clavulanate (AUGMENTIN) 875-125 MG tablet Take 1 tablet by mouth every 12 (twelve) hours. 04/15/20   Fredia Sorrow, MD  azelastine (ASTELIN) 0.1 % nasal spray 1 spray by Nasal route 2 times daily. Use in each nostril as directed    [provider]  cetirizine (ZYRTEC ALLERGY) 10 MG tablet Take 10 mg by mouth daily.    [provider]  Cholecalciferol (VITAMIN D-3) 5000 UNITS TABS Take 5,000 tablets by mouth daily.    [provider]  diphenhydrAMINE  (BENADRYL ALLERGY) 25 MG tablet Take 25 mg by mouth nightly as needed for Itching.    [provider]  levothyroxine (SYNTHROID) 25 MCG tablet Take 25 mcg by mouth daily. 09/27/19   [provider]  metoprolol succinate (TOPROL-XL) 25 MG 24 hr tablet Take 1 tablet (25 mg total) by mouth daily. 1 TABLET BY MOUTH 4 TIMES A DAY AS NEEDED 10/12/19   Loletha Grayer, MD  montelukast (SINGULAIR) 10 MG tablet Take 10 mg by mouth at bedtime.    [provider]  Multiple Vitamin (MULTI-VITAMIN) tablet Take by mouth.    [provider]  omeprazole (PRILOSEC OTC) 20 MG tablet Take 20 mg by mouth daily.     [provider]  ondansetron (ZOFRAN) 4 MG tablet Take 1 tablet (4 mg total) by mouth every 6 (six) hours as needed for nausea. 10/12/19   Loletha Grayer, MD  Probiotic Product (PROBIOTIC ADVANCED PO) Take 1 capsule by mouth 2 (two) times daily. 100 BILLION    [provider]  traMADol (ULTRAM) 50 MG tablet tramadol 50 mg tablet 05/19/19   [provider]    Allergies    Epinephrine, Isovue [iopamidol], Sulfa antibiotics, Acidophilus (probiotic) [lactase-lactobacillus], Azithromycin, Ciprofloxacin, Flagyl [metronidazole], Prednisone, Sulfacetamide sodium, and Wheat bran  Review of Systems   Review of Systems  Constitutional: Negative for chills and fever.  HENT: Negative for ear pain and sore throat.   Eyes: Negative for pain and visual disturbance.  Respiratory: Positive for shortness of breath. Negative for cough.   Cardiovascular: Negative for chest pain and palpitations.  Gastrointestinal: Positive for nausea. Negative for abdominal pain and vomiting.  Genitourinary: Negative for dysuria and hematuria.  Musculoskeletal: Negative for arthralgias and back pain.  Skin: Negative for color change and rash.  Neurological: Negative for seizures and syncope.  All other systems reviewed and are negative.   Physical Exam Updated Vital  Signs BP (!) 146/96 (BP Location: Left Arm)   Pulse 94   Temp 97.9 F (36.6 C) (Oral)   Resp 18   Ht 5\' 7"  (1.702 m)   Wt 71.7 kg   LMP 03/15/2013   SpO2 99%   BMI 24.75 kg/m   Physical Exam Vitals and nursing note reviewed.  Constitutional:      General: She is not in acute distress.    Appearance: She is well-developed and well-nourished. She is not ill-appearing or diaphoretic.  HENT:     Head: Normocephalic and atraumatic.  Eyes:  Conjunctiva/sclera: Conjunctivae normal.  Cardiovascular:     Rate and Rhythm: Normal rate and regular rhythm.     Heart sounds: No murmur heard.   Pulmonary:     Effort: Pulmonary effort is normal. No respiratory distress.     Breath sounds: Normal breath sounds. No decreased breath sounds, wheezing or rhonchi.  Abdominal:     Palpations: Abdomen is soft.     Tenderness: There is no abdominal tenderness.  Musculoskeletal:        General: No edema.     Cervical back: Neck supple.     Right lower leg: No tenderness. No edema.     Left lower leg: No tenderness. No edema.  Skin:    General: Skin is warm and dry.  Neurological:     General: No focal deficit present.     Mental Status: She is alert.  Psychiatric:        Mood and Affect: Mood and affect and mood normal.        Behavior: Behavior normal.     ED Results / Procedures / Treatments   Labs (all labs ordered are listed, but only abnormal results are displayed) Labs Reviewed  CBC  BASIC METABOLIC PANEL    EKG EKG Interpretation  Date/Time:  Sunday June 24 2020 19:04:09 EST Ventricular Rate:  85 PR Interval:    QRS Duration: 105 QT Interval:  393 QTC Calculation: 468 R Axis:   20 Text Interpretation: Sinus rhythm Ventricular premature complex Consider left atrial enlargement Abnormal R-wave progression, early transition No significant change since last tracing Confirmed by Blanchie Dessert [phone]) on 06/24/2020 7:17:19 PM   Radiology DG Chest 2  View  Result Date: 06/24/2020 CLINICAL DATA:  Shortness of breath. EXAM: CHEST - 2 VIEW COMPARISON:  None. FINDINGS: The heart size and mediastinal contours are within normal limits. Both lungs are clear. The visualized skeletal structures are unremarkable. IMPRESSION: No active cardiopulmonary disease. Electronically Signed   By: Dorise Bullion III M.D   On: 06/24/2020 19:40    Procedures Procedures   Medications Ordered in ED Medications  sodium chloride 0.9 % bolus 1,000 mL (has no administration in time range)    ED Course  I have reviewed the triage vital signs and the nursing notes.  Pertinent labs & imaging results that were available during my care of the patient were reviewed by me and considered in my medical decision making (see chart for details).    MDM Rules/Calculators/A&P                         Patient presents with onset of shortness of breath today.  Recently started on Augmentin for sinus infection.  She reports history of dysautonomia, requiring weekly fluids, reports that this feels like her sodium is low and she will likely need fluid resuscitation.  She would like to rule out a pneumonia, though she denies any cough, fevers, chills, or any other infectious symptoms.  She did have Covid back in January.  She is not tachycardic, or hypoxic here.  Her lung sounds are clear, she has no evidence of fluid overload on exam.  Low suspicion for infection, PE, CHF.  Will order chest x-ray, basic labs, and give the patient a liter of NS.   Chest xray without acute abnormalities.Basic labs pending  Signed out to [employer] who will oversee the rest of her care, suspect she will be able for discharge pending normal labs and receiving  labs.   Final Clinical Impression(s) / ED Diagnoses Final diagnoses:  None    Rx / DC Orders ED Discharge Orders    None       Lyndel Safe 06/24/20 1946    Blanchie Dessert, MD 06/27/20 [phone]

## 2020-06-24 NOTE — ED Provider Notes (Signed)
  Physical Exam  BP 126/85 (BP Location: Left Arm)   Pulse 80   Temp 97.9 F (36.6 C) (Oral)   Resp 18   Ht 5\' 7"  (1.702 m)   Wt 71.7 kg   LMP 03/15/2013   SpO2 96%   BMI 24.75 kg/m   ED Course/Procedures      EKG Interpretation  Date/Time:  Sunday June 24 2020 19:04:09 EST Ventricular Rate:  85 PR Interval:    QRS Duration: 105 QT Interval:  393 QTC Calculation: 468 R Axis:   20 Text Interpretation: Sinus rhythm Ventricular premature complex Consider left atrial enlargement Abnormal R-wave progression, early transition No significant change since last tracing Confirmed by Blanchie Dessert [phone]) on 06/24/2020 7:17:19 PM       Procedures  MDM   Care of this patient assumed from preceding ED provider, Sharyn Lull, PA-C at time of shift change.  All pertinent HPI, physical exam findings were reviewed with me prior to her departure.  In brief patient with history of dysautonomia, hypertension, Lyme, muscle activation syndrome, POTS, and fibromyalgia presents to the emergency department concerned for mild nausea and intermittent shortness of breath.  She states she feels this way when her sodium is low, and that she requires regularly scheduled sodium infusions.  She is requesting an NS infusion at this time.  She was started on Augmentin at urgent care today.  Of note patient is under tremendous amount of stress as her husband is currently admitted to [facility] undergoing treatment for cancer with concurrent pneumonia at this time.  She denies any chest pain, cough, fevers, chills.  At time of shift change basic laboratory studies have resulted.  CBC is unremarkable, BMP is without hyponatremia or other electrolyte derangement.  EKG additionally revealed sinus rhythm.  Chest x-ray pending, patient is currently receiving bag of IV NS.   Chest x-ray negative for acute cardiopulmonary disease.  Patient reevaluated and states she is feeling much better after  administration of IV fluids.  Given reassuring physical exam, vital signs, laboratory and imaging studies, no further work-up is warranted in the ED at this time.  Katie [name] voiced understanding of her medical evaluation and treatment plan.  Each of her questions was answered to her expressed satisfaction.  Return precautions were given.  Patient is well-appearing, stable, and appropriate for discharge at this time.  This chart was dictated using voice recognition software, Dragon. Despite the best efforts of this provider to proofread and correct errors, errors may still occur which can change documentation meaning.      Katie [name] 06/24/20 2115    Blanchie Dessert, MD 06/24/20 2143

## 2020-06-24 NOTE — Discharge Instructions (Signed)
You were evaluated in the emergency department today for your sensation of shortness of breath.  Your physical exam, vital signs, blood work, chest x-ray, and EKG were very reassuring.  There are no signs of infection in your lungs, your sodium and other electrolytes are within normal range at this time.  You were administered a liter of IV fluids in the emergency department, and endorsed resolution of your symptoms at this time.  I recommend you follow-up closely with your primary care provider.  Return to the emergency department develop any chest pain, difficulty breathing, palpitations, nausea or vomiting that does not stop, or any other new severe symptoms.

## 2020-06-24 NOTE — ED Notes (Signed)
AVS provided to client from [place], reinforced information from MD. Opportunity for questions provided

## 2020-06-24 NOTE — ED Notes (Signed)
Patient ambulated to X-ray with rad tech

## 2020-06-24 NOTE — ED Triage Notes (Signed)
Reports being diagnosed with sinus infection but was negative for covid.  Had covid in January.  On antibiotic for the sinus infection.  Has been on antibiotic for three days.  Reports she feels worse today and a little SOB.  Worried she may have pneumonia.

## 2020-07-26 ENCOUNTER — Other Ambulatory Visit: Payer: Self-pay

## 2020-07-26 ENCOUNTER — Encounter (HOSPITAL_BASED_OUTPATIENT_CLINIC_OR_DEPARTMENT_OTHER): Payer: Self-pay | Admitting: Emergency Medicine

## 2020-07-26 ENCOUNTER — Emergency Department (HOSPITAL_BASED_OUTPATIENT_CLINIC_OR_DEPARTMENT_OTHER)
Admission: EM | Admit: 2020-07-26 | Discharge: 2020-07-26 | Disposition: A | Discharge status: Home / Self Care | Payer: Medicare Other | Attending: Emergency Medicine | Admitting: Emergency Medicine

## 2020-07-26 DIAGNOSIS — E860 Dehydration: Secondary | ICD-10-CM | POA: Insufficient documentation

## 2020-07-26 DIAGNOSIS — I10 Essential (primary) hypertension: Secondary | ICD-10-CM | POA: Diagnosis not present

## 2020-07-26 DIAGNOSIS — R0602 Shortness of breath: Secondary | ICD-10-CM | POA: Diagnosis not present

## 2020-07-26 DIAGNOSIS — R531 Weakness: Secondary | ICD-10-CM | POA: Diagnosis present

## 2020-07-26 DIAGNOSIS — E039 Hypothyroidism, unspecified: Secondary | ICD-10-CM | POA: Insufficient documentation

## 2020-07-26 DIAGNOSIS — Z79899 Other long term (current) drug therapy: Secondary | ICD-10-CM | POA: Insufficient documentation

## 2020-07-26 DIAGNOSIS — R5383 Other fatigue: Secondary | ICD-10-CM | POA: Diagnosis not present

## 2020-07-26 LAB — CBC WITH DIFFERENTIAL/PLATELET
Abs Immature Granulocytes: 0 10*3/uL (ref 0.00–0.07)
Basophils Absolute: 0.1 10*3/uL (ref 0.0–0.1)
Basophils Relative: 1 %
Eosinophils Absolute: 0.2 10*3/uL (ref 0.0–0.5)
Eosinophils Relative: 4 %
HCT: 41.8 % (ref 36.0–46.0)
Hemoglobin: 14.1 g/dL (ref 12.0–15.0)
Immature Granulocytes: 0 %
Lymphocytes Relative: 49 %
Lymphs Abs: 2.2 10*3/uL (ref 0.7–4.0)
MCH: 32.5 pg (ref 26.0–34.0)
MCHC: 33.7 g/dL (ref 30.0–36.0)
MCV: 96.3 fL (ref 80.0–100.0)
Monocytes Absolute: 0.4 10*3/uL (ref 0.1–1.0)
Monocytes Relative: 9 %
Neutro Abs: 1.6 10*3/uL — ABNORMAL LOW (ref 1.7–7.7)
Neutrophils Relative %: 37 %
Platelets: 313 10*3/uL (ref 150–400)
RBC: 4.34 MIL/uL (ref 3.87–5.11)
RDW: 12.1 % (ref 11.5–15.5)
WBC: 4.5 10*3/uL (ref 4.0–10.5)
nRBC: 0 % (ref 0.0–0.2)

## 2020-07-26 LAB — BASIC METABOLIC PANEL
Anion gap: 11 (ref 5–15)
BUN: 7 mg/dL — ABNORMAL LOW (ref 8–23)
CO2: 26 mmol/L (ref 22–32)
Calcium: 8.9 mg/dL (ref 8.9–10.3)
Chloride: 100 mmol/L (ref 98–111)
Creatinine, Ser: 0.78 mg/dL (ref 0.44–1.00)
GFR, Estimated: >60 mL/min (ref >60)
Glucose, Bld: 92 mg/dL (ref 70–99)
Potassium: 3.9 mmol/L (ref 3.5–5.1)
Sodium: 137 mmol/L (ref 135–145)

## 2020-07-26 MED ORDER — SODIUM CHLORIDE 0.9 % IV BOLUS
1000.0000 mL | Freq: Once | INTRAVENOUS | Status: AC
  Administered 2020-07-26: 1000 mL via INTRAVENOUS

## 2020-07-26 NOTE — ED Triage Notes (Signed)
Pt c/o feeling dehydrated x today. Pt has not been eating or drinking as much. Pt not talkative in triage. Pt husband passed away this morning and is not feeling up to talking at this time. Pt tearful, upset in triage. Ambulatory with steady gait, VSS, GCS 15.

## 2020-07-26 NOTE — ED Notes (Addendum)
Pt given warm blankets and ginger ale at this time. Family at the bedside. IV fluids infusing.

## 2020-07-26 NOTE — ED Provider Notes (Signed)
[facility] EMERGENCY DEPARTMENT Provider Note   CSN: [id number] Arrival date & time: 07/26/20  2049     History No chief complaint on file.   Katie [name] is a 65 y.o. female.  She has a history of dysautonomia and gets weekly saline infusions to help regulate her system.  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  She is complaining of some shortness of breath and feeling dehydrated and some mental fog.  She says is her typical symptoms for her dysautonomia.  The history is provided by the patient.  Weakness Severity:  Moderate Onset quality:  Gradual Timing:  Constant Progression:  Worsening Chronicity:  Recurrent Context: stress   Relieved by:  Nothing Worsened by:  Activity Ineffective treatments:  Rest Associated symptoms: shortness of breath   Associated symptoms: no abdominal pain, no chest pain, no dysuria, no fever, no headaches, no loss of consciousness, no sensory-motor deficit, no stroke symptoms and no vomiting        Past Medical History:  Diagnosis Date  . Anxiety   . Diverticulitis   . Dysautonomia ([place])   . Fibromyalgia   . Hypertension   . Lyme disease   . Mast cell activation syndrome ([place])   . Osteopenia 05/2017   T score -2.2 FRAX 11% / 1.7%  . POTS (postural orthostatic tachycardia syndrome)     Patient Active Problem List   Diagnosis Date Noted  . Dysautonomia ([place])   . POTS (postural orthostatic tachycardia syndrome)   . Hypothyroidism   . Allergic rhinitis   . Diarrhea 11/14/2015  . Acute gastroenteritis 11/14/2015  . Dehydration   . Enteritis 11/13/2015  . Hyponatremia 11/13/2015  . Elevated blood pressure reading 07/14/2013  . Lyme disease 10/25/2012  . Chronic fatigue syndrome 10/25/2012  . Anxiety 10/25/2012  . Fibromyalgia 10/25/2012  . Hormone replacement therapy - followed by Dr. Lonia Skinner in gyn and by [place] 10/25/2012    Past Surgical History:  Procedure Laterality Date  . Cataract Removal With Lens Implant Placement Left 03-06-2015  . CERVICAL BIOPSY  W/ LOOP ELECTRODE EXCISION  04/19/2004   HGSIL CIN II  MARGINS NEGATIVE  . COLPOSCOPY  02/19/2004   HGSIL CIN II     OB History    Gravida  2   Para  2   Term      Preterm      AB      Living  2     SAB      IAB      Ectopic      Multiple      Live Births              Family History  Problem Relation Age of Onset  . Heart disease Mother        CHF  . Hypertension Mother   . Cancer Father 48       lung cancer  . Hypertension Father   . Hypertension Paternal Grandmother   . Stroke Paternal Grandmother   . Hypertension Paternal Grandfather   . Mental retardation Son     Social History   Tobacco Use  . Smoking status: Never Smoker  . Smokeless tobacco: Never Used  Vaping Use  . Vaping Use: Never used  Substance Use Topics  . Alcohol use: Yes    Comment: occ  . Drug use:  No    Home Medications Prior to Admission medications   Medication Sig Start Date End Date Taking? Authorizing Provider  ALPRAZolam Duanne Moron) 0.5 MG tablet Take 0.5 mg by mouth 3 (three) times daily as needed. 09/12/19   [provider]  amoxicillin-clavulanate (AUGMENTIN) 875-125 MG tablet Take 1 tablet by mouth every 12 (twelve) hours. 04/15/20   Fredia Sorrow, MD  azelastine (ASTELIN) 0.1 % nasal spray 1 spray by Nasal route 2 times daily. Use in each nostril as directed    [provider]  cetirizine (ZYRTEC ALLERGY) 10 MG tablet Take 10 mg by mouth daily.    [provider]  Cholecalciferol (VITAMIN D-3) 5000 UNITS TABS Take 5,000 tablets by mouth daily.    [provider]  diphenhydrAMINE (BENADRYL ALLERGY) 25 MG tablet Take 25 mg by mouth nightly as needed for Itching.    [provider]  levothyroxine (SYNTHROID) 25 MCG tablet Take 25 mcg by mouth daily. 09/27/19    [provider]  metoprolol succinate (TOPROL-XL) 25 MG 24 hr tablet Take 1 tablet (25 mg total) by mouth daily. 1 TABLET BY MOUTH 4 TIMES A DAY AS NEEDED 10/12/19   Loletha Grayer, MD  montelukast (SINGULAIR) 10 MG tablet Take 10 mg by mouth at bedtime.    [provider]  Multiple Vitamin (MULTI-VITAMIN) tablet Take by mouth.    [provider]  omeprazole (PRILOSEC OTC) 20 MG tablet Take 20 mg by mouth daily.     [provider]  ondansetron (ZOFRAN) 4 MG tablet Take 1 tablet (4 mg total) by mouth every 6 (six) hours as needed for nausea. 10/12/19   Loletha Grayer, MD  Probiotic Product (PROBIOTIC ADVANCED PO) Take 1 capsule by mouth 2 (two) times daily. 100 BILLION    [provider]  traMADol (ULTRAM) 50 MG tablet tramadol 50 mg tablet 05/19/19   [provider]    Allergies    Epinephrine, Isovue [iopamidol], Sulfa antibiotics, Acidophilus (probiotic) [lactase-lactobacillus], Azithromycin, Ciprofloxacin, Flagyl [metronidazole], Prednisone, Sulfacetamide sodium, and Wheat bran  Review of Systems   Review of Systems  Constitutional: Positive for fatigue. Negative for fever.  HENT: Negative for sore throat.   Eyes: Negative for visual disturbance.  Respiratory: Positive for shortness of breath.   Cardiovascular: Negative for chest pain.  Gastrointestinal: Negative for abdominal pain and vomiting.  Genitourinary: Negative for dysuria.  Musculoskeletal: Negative for joint swelling.  Skin: Negative for rash.  Neurological: Positive for weakness. Negative for loss of consciousness and headaches.    Physical Exam Updated Vital Signs BP (!) 120/101 (BP Location: Right Arm)   Pulse 92   Temp 98.3 F (36.8 C) (Oral)   Resp 20   LMP 03/15/2013   SpO2 100%   Physical Exam Vitals and nursing note reviewed.  Constitutional:      General: She is not in acute distress.    Appearance: Normal appearance. She is well-developed.  HENT:      Head: Normocephalic and atraumatic.  Eyes:     Conjunctiva/sclera: Conjunctivae normal.  Cardiovascular:     Rate and Rhythm: Normal rate and regular rhythm.     Heart sounds: No murmur heard.   Pulmonary:     Effort: Pulmonary effort is normal. No respiratory distress.     Breath sounds: Normal breath sounds.  Abdominal:     Palpations: Abdomen is soft.     Tenderness: There is no abdominal tenderness.  Musculoskeletal:        General: No  deformity or signs of injury. Normal range of motion.     Cervical back: Neck supple.  Skin:    General: Skin is warm and dry.     Capillary Refill: Capillary refill takes less than 2 seconds.  Neurological:     General: No focal deficit present.     Mental Status: She is alert.     ED Results / Procedures / Treatments   Labs (all labs ordered are listed, but only abnormal results are displayed) Labs Reviewed  BASIC METABOLIC PANEL - Abnormal; Notable for the following components:      Result Value   BUN 7 (*)    All other components within normal limits  CBC WITH DIFFERENTIAL/PLATELET - Abnormal; Notable for the following components:   Neutro Abs 1.6 (*)    All other components within normal limits    EKG None  Radiology No results found.  Procedures Procedures   Medications Ordered in ED Medications  sodium chloride 0.9 % bolus 1,000 mL (has no administration in time range)    ED Course  I have reviewed the triage vital signs and the nursing notes.  Pertinent labs & imaging results that were available during my care of the patient were reviewed by me and considered in my medical decision making (see chart for details).  Clinical Course as of 07/27/20 0957  Thu Jul 26, 2020  2214 Patient feeling much improved after fluids.  Asking to be discharged. [MB]    Clinical Course User Index [MB] Hayden Rasmussen, MD   MDM Rules/Calculators/A&P                          This patient complains of weakness fatigue  shortness of breath; this involves an extensive number of treatment Options and is a complaint that carries with it a high risk of complications and Morbidity. The differential includes dehydration, metabolic derangement, anemia, infection  I ordered, reviewed and interpreted labs, which included CBC with normal white count normal hemoglobin, chemistries normal other than low BUN I ordered medication IV fluids Additional history obtained from patient's sister-in-law Previous records obtained and reviewed in epic, no recent admissions  After the interventions stated above, I reevaluated the patient and found patient to be symptomatically improved.  She says this presentation is typical of her chronic disease.  I do not feel it requires any further intervention at this time.  She is comfortable plan for discharge.  Final Clinical Impression(s) / ED Diagnoses Final diagnoses:  Dehydration    Rx / DC Orders ED Discharge Orders    None       Hayden Rasmussen, MD 07/27/20 1001

## 2020-08-24 ENCOUNTER — Emergency Department (HOSPITAL_BASED_OUTPATIENT_CLINIC_OR_DEPARTMENT_OTHER)
Admission: EM | Admit: 2020-08-24 | Discharge: 2020-08-24 | Disposition: A | Discharge status: Home / Self Care | Payer: Medicare Other | Attending: Emergency Medicine | Admitting: Emergency Medicine

## 2020-08-24 ENCOUNTER — Encounter (HOSPITAL_BASED_OUTPATIENT_CLINIC_OR_DEPARTMENT_OTHER): Payer: Self-pay

## 2020-08-24 ENCOUNTER — Other Ambulatory Visit: Payer: Self-pay

## 2020-08-24 DIAGNOSIS — E860 Dehydration: Secondary | ICD-10-CM

## 2020-08-24 DIAGNOSIS — J0100 Acute maxillary sinusitis, unspecified: Secondary | ICD-10-CM

## 2020-08-24 DIAGNOSIS — I10 Essential (primary) hypertension: Secondary | ICD-10-CM | POA: Insufficient documentation

## 2020-08-24 DIAGNOSIS — J0101 Acute recurrent maxillary sinusitis: Secondary | ICD-10-CM | POA: Insufficient documentation

## 2020-08-24 DIAGNOSIS — E039 Hypothyroidism, unspecified: Secondary | ICD-10-CM | POA: Insufficient documentation

## 2020-08-24 DIAGNOSIS — R5383 Other fatigue: Secondary | ICD-10-CM

## 2020-08-24 DIAGNOSIS — R531 Weakness: Secondary | ICD-10-CM | POA: Diagnosis present

## 2020-08-24 LAB — CBC WITH DIFFERENTIAL/PLATELET
Abs Immature Granulocytes: 0.03 10*3/uL (ref 0.00–0.07)
Basophils Absolute: 0 10*3/uL (ref 0.0–0.1)
Basophils Relative: 1 %
Eosinophils Absolute: 0.1 10*3/uL (ref 0.0–0.5)
Eosinophils Relative: 1 %
HCT: 38.8 % (ref 36.0–46.0)
Hemoglobin: 12.9 g/dL (ref 12.0–15.0)
Immature Granulocytes: 1 %
Lymphocytes Relative: 32 %
Lymphs Abs: 1.7 10*3/uL (ref 0.7–4.0)
MCH: 32.4 pg (ref 26.0–34.0)
MCHC: 33.2 g/dL (ref 30.0–36.0)
MCV: 97.5 fL (ref 80.0–100.0)
Monocytes Absolute: 0.4 10*3/uL (ref 0.1–1.0)
Monocytes Relative: 8 %
Neutro Abs: 3.1 10*3/uL (ref 1.7–7.7)
Neutrophils Relative %: 57 %
Platelets: 268 10*3/uL (ref 150–400)
RBC: 3.98 MIL/uL (ref 3.87–5.11)
RDW: 12.1 % (ref 11.5–15.5)
WBC: 5.3 10*3/uL (ref 4.0–10.5)
nRBC: 0 % (ref 0.0–0.2)

## 2020-08-24 LAB — COMPREHENSIVE METABOLIC PANEL
ALT: 17 U/L (ref 0–44)
AST: 22 U/L (ref 15–41)
Albumin: 4.1 g/dL (ref 3.5–5.0)
Alkaline Phosphatase: 74 U/L (ref 38–126)
Anion gap: 11 (ref 5–15)
BUN: 10 mg/dL (ref 8–23)
CO2: 26 mmol/L (ref 22–32)
Calcium: 9.3 mg/dL (ref 8.9–10.3)
Chloride: 97 mmol/L — ABNORMAL LOW (ref 98–111)
Creatinine, Ser: 0.63 mg/dL (ref 0.44–1.00)
GFR, Estimated: >60 mL/min (ref >60)
Glucose, Bld: 99 mg/dL (ref 70–99)
Potassium: 3.8 mmol/L (ref 3.5–5.1)
Sodium: 134 mmol/L — ABNORMAL LOW (ref 135–145)
Total Bilirubin: 1 mg/dL (ref 0.3–1.2)
Total Protein: 7.5 g/dL (ref 6.5–8.1)

## 2020-08-24 LAB — URINALYSIS, ROUTINE W REFLEX MICROSCOPIC
Bilirubin Urine: NEGATIVE
Glucose, UA: NEGATIVE mg/dL
Hgb urine dipstick: NEGATIVE
Ketones, ur: NEGATIVE mg/dL
Leukocytes,Ua: NEGATIVE
Nitrite: NEGATIVE
Protein, ur: NEGATIVE mg/dL
Specific Gravity, Urine: 1.01 (ref 1.005–1.030)
pH: 8 (ref 5.0–8.0)

## 2020-08-24 MED ORDER — SODIUM CHLORIDE 0.9 % IV BOLUS
1000.0000 mL | Freq: Once | INTRAVENOUS | Status: AC
  Administered 2020-08-24: 1000 mL via INTRAVENOUS

## 2020-08-24 MED ORDER — ONDANSETRON 4 MG PO TBDP: 4.0000 mg | ORAL_TABLET | Freq: Three times a day (TID) | ORAL | 0 refills | Status: DC | PRN

## 2020-08-24 MED ORDER — AMOXICILLIN-POT CLAVULANATE 875-125 MG PO TABS: 1.0000 | ORAL_TABLET | Freq: Two times a day (BID) | ORAL | 0 refills | Status: DC

## 2020-08-24 NOTE — ED Notes (Signed)
Has POTS -  stuffy nose - nasal rinse yesterday - yellow colored mucus when rinse completed  Lexapro causing constipation  Poor appetite x2 weeks Has seasonal allergies Weakness and fatigue Received IV fluids yesterday  "Tremendous amounts of stress the last month"

## 2020-08-24 NOTE — Discharge Instructions (Addendum)
Pleasure taking care of you here in the emergency department.  Your sodium was mildly low at 134.  You are given IV fluids.  I have written you a medication for a possible sinus infection as well as refilled your Zofran  Return for any worsening symptoms.

## 2020-08-24 NOTE — ED Provider Notes (Signed)
[facility] EMERGENCY DEPARTMENT Provider Note   CSN: [id number] Arrival date & time: 08/24/20  1333    History Chief Complaint  Patient presents with  . Weakness    Katie [name] is a 65 y.o. female with past medical history significant for hypertension, fibromyalgia, constipation, mast cell activation who presents for evaluation of generalized weakness.  Patient states she typically requires weekly IV fluids.  She is followed with [facility].  She had IV fluids yesterday.  Patient did state that she has been depressed since her significant other passed 1 month ago.  She feels like she is sleeping at night however he has just felt fatigue and generalized weakness over the last month.  Does have some mild anxiety at baseline.  She states she talk with her primary care provider about this on Monday.  He started her on 10 mg of Lexapro on Tuesday.  Patient states since starting Lexapro she does not "feel right."  She has a hard time describing this sensation.  She does admit to some nasal congestion over the last 3 days.  States she has history of recurrent sinusitis.  Does state that this feels like when she has had sinusitis previously.  She feels like she has some pressure to her maxillary sinuses.  States she feels generally weak and fatigued.  No focal weakness.  She denies any headache, blurred vision, sore throat, neck stiffness, chest pain, shortness of breath, abdominal pain, diarrhea, dysuria, paresthesias, difficulty with word finding.  She does state that since she started the Lexapro her stomach feels "unsettled."  She denies any gross pain however he states it feels "off."  Had a bowel movement yesterday.  No melena or bright red blood per rectum. No prior abd surgeries.  She is requesting IV fluids today.  Has had overall decreased appetite over the last month however no nausea or emesis. No extremity edema.  Denies additional aggravating or alleviating factors.  She did call her  PCP yesterday evening.  He recommended due to some of her symptoms her stopping the Lexapro.  She did not take today however did take yesterday evening.  She had COVID in January.  No recent exposures.  Denies additional aggravating or alleviating factors.  Does state that a lot of her symptoms are of her typical dysautonomia.  History obtained from patient and past medical records.  No interpreter used.  HPI     Past Medical History:  Diagnosis Date  . Anxiety   . Diverticulitis   . Dysautonomia ([place])   . Fibromyalgia   . Hypertension   . Lyme disease   . Mast cell activation syndrome ([place])   . Osteopenia 05/2017   T score -2.2 FRAX 11% / 1.7%  . POTS (postural orthostatic tachycardia syndrome)     Patient Active Problem List   Diagnosis Date Noted  . Dysautonomia ([place])   . POTS (postural orthostatic tachycardia syndrome)   . Hypothyroidism   . Allergic rhinitis   . Diarrhea 11/14/2015  . Acute gastroenteritis 11/14/2015  . Dehydration   . Enteritis 11/13/2015  . Hyponatremia 11/13/2015  . Elevated blood pressure reading 07/14/2013  . Lyme disease 10/25/2012  . Chronic fatigue syndrome 10/25/2012  . Anxiety 10/25/2012  . Fibromyalgia 10/25/2012  . Hormone replacement therapy - followed by Dr. Lonia Skinner in gyn and by [facility] 10/25/2012    Past Surgical History:  Procedure Laterality Date  . Cataract Removal With Lens Implant Placement Left 03-06-2015  .  CERVICAL BIOPSY  W/ LOOP ELECTRODE EXCISION  04/19/2004   HGSIL CIN II  MARGINS NEGATIVE  . COLPOSCOPY  02/19/2004   HGSIL CIN II     OB History    Gravida  2   Para  2   Term      Preterm      AB      Living  2     SAB      IAB      Ectopic      Multiple      Live Births              Family History  Problem Relation Age of Onset  . Heart disease Mother        CHF  . Hypertension Mother   . Cancer Father 76       lung cancer  . Hypertension Father   .  Hypertension Paternal Grandmother   . Stroke Paternal Grandmother   . Hypertension Paternal Grandfather   . Mental retardation Son     Social History   Tobacco Use  . Smoking status: Never Smoker  . Smokeless tobacco: Never Used  Vaping Use  . Vaping Use: Never used  Substance Use Topics  . Alcohol use: Yes    Comment: occ  . Drug use: No    Home Medications Prior to Admission medications   Medication Sig Start Date End Date Taking? Authorizing Provider  amoxicillin-clavulanate (AUGMENTIN) 875-125 MG tablet Take 1 tablet by mouth every 12 (twelve) hours. 08/24/20  Yes Marimar Suber A, PA-C  ondansetron (ZOFRAN ODT) 4 MG disintegrating tablet Take 1 tablet (4 mg total) by mouth every 8 (eight) hours as needed for nausea or vomiting. 08/24/20  Yes Drianna Chandran A, PA-C  ALPRAZolam (XANAX) 0.5 MG tablet Take 0.5 mg by mouth 3 (three) times daily as needed. 09/12/19   [provider]  azelastine (ASTELIN) 0.1 % nasal spray 1 spray by Nasal route 2 times daily. Use in each nostril as directed    [provider]  cetirizine (ZYRTEC ALLERGY) 10 MG tablet Take 10 mg by mouth daily.    [provider]  Cholecalciferol (VITAMIN D-3) 5000 UNITS TABS Take 5,000 tablets by mouth daily.    [provider]  diphenhydrAMINE (BENADRYL ALLERGY) 25 MG tablet Take 25 mg by mouth nightly as needed for Itching.    [provider]  levothyroxine (SYNTHROID) 25 MCG tablet Take 25 mcg by mouth daily. 09/27/19   [provider]  metoprolol succinate (TOPROL-XL) 25 MG 24 hr tablet Take 1 tablet (25 mg total) by mouth daily. 1 TABLET BY MOUTH 4 TIMES A DAY AS NEEDED 10/12/19   Loletha Grayer, MD  montelukast (SINGULAIR) 10 MG tablet Take 10 mg by mouth at bedtime.    [provider]  Multiple Vitamin (MULTI-VITAMIN) tablet Take by mouth.    [provider]  omeprazole (PRILOSEC OTC) 20 MG tablet Take 20 mg by mouth daily.     [provider]  ondansetron (ZOFRAN) 4 MG tablet Take 1 tablet (4 mg total) by mouth every 6 (six) hours as needed for nausea. 10/12/19   Loletha Grayer, MD  Probiotic Product (PROBIOTIC ADVANCED PO) Take 1 capsule by mouth 2 (two) times daily. 100 BILLION    [provider]  traMADol (ULTRAM) 50 MG tablet tramadol 50 mg tablet 05/19/19   [provider]    Allergies    Epinephrine, Isovue [iopamidol], Sulfa antibiotics, Acidophilus (  probiotic) [lactase-lactobacillus], Azithromycin, Ciprofloxacin, Flagyl [metronidazole], Prednisone, Sulfacetamide sodium, and Wheat bran  Review of Systems   Review of Systems  Constitutional: Positive for appetite change (x1 month) and fatigue. Negative for activity change, chills, diaphoresis and fever.  HENT: Positive for congestion, sinus pressure and sneezing. Negative for ear pain, facial swelling, postnasal drip, rhinorrhea, sinus pain, sore throat, tinnitus and trouble swallowing.   Eyes: Negative.   Respiratory: Negative.   Cardiovascular: Negative.   Gastrointestinal: Negative.   Genitourinary: Negative.   Musculoskeletal: Negative.   Skin: Negative.   Neurological: Positive for weakness. Negative for dizziness, seizures, syncope, speech difficulty, light-headedness, numbness and headaches.  All other systems reviewed and are negative.   Physical Exam Updated Vital Signs BP (!) 165/95   Pulse (!) 56   Temp 98.2 F (36.8 C) (Oral)   Resp 18   Ht 5\' 7"  (1.702 m)   Wt 72.5 kg   LMP 03/15/2013   SpO2 99%   BMI 25.03 kg/m   Physical Exam  Physical Exam  Constitutional: Pt is oriented to person, place, and time. Pt appears well-developed and well-nourished. No distress.  HENT:  Head: Normocephalic and atraumatic.  Mouth/Throat: Oropharynx is clear and moist.  Eyes: Conjunctivae and EOM are normal. Pupils are equal, round, and reactive to light. No scleral icterus.  No horizontal, vertical or rotational nystagmus  Neck:  Normal range of motion. Neck supple.  Full active and passive ROM without pain No midline or paraspinal tenderness No nuchal rigidity or meningeal signs  Cardiovascular: Normal rate, regular rhythm and intact distal pulses.   Pulmonary/Chest: Effort normal and breath sounds normal. No respiratory distress. Pt has no wheezes. No rales.  Abdominal: Soft. Bowel sounds are normal. There is no tenderness. There is no rebound and no guarding.  Musculoskeletal: Normal range of motion.  Lymphadenopathy:    No cervical adenopathy.  Neurological: Pt. is alert and oriented to person, place, and time. He has normal reflexes. No cranial nerve deficit.  Exhibits normal muscle tone. Coordination normal.  Mental Status:  Alert, oriented, thought content appropriate. Speech fluent without evidence of aphasia. Able to follow 2 step commands without difficulty.  Cranial Nerves:  II:  Peripheral visual fields grossly normal, pupils equal, round, reactive to light III,IV, VI: ptosis not present, extra-ocular motions intact bilaterally  V,VII: smile symmetric, facial light touch sensation equal VIII: hearing grossly normal bilaterally  IX,X: midline uvula rise  XI: bilateral shoulder shrug equal and strong XII: midline tongue extension  Motor:  5/5 in upper and lower extremities bilaterally including strong and equal grip strength and dorsiflexion/plantar flexion Sensory: Pinprick and light touch normal in all extremities.  Deep Tendon Reflexes: 2+ and symmetric  Cerebellar: normal finger-to-nose with bilateral upper extremities Gait: normal gait and balance CV: distal pulses palpable throughout   Skin: Skin is warm and dry. No rash noted. Pt is not diaphoretic.  Psychiatric: Pt has a normal mood and affect. Behavior is normal. Judgment and thought content normal.  Nursing note and vitals reviewed. ED Results / Procedures / Treatments   Labs (all labs ordered are listed, but only abnormal results are  displayed) Labs Reviewed  COMPREHENSIVE METABOLIC PANEL - Abnormal; Notable for the following components:      Result Value   Sodium 134 (*)    Chloride 97 (*)    All other components within normal limits  CBC WITH DIFFERENTIAL/PLATELET  URINALYSIS, ROUTINE W REFLEX MICROSCOPIC    EKG EKG Interpretation  Date/Time:  Friday  August 24 2020 14:30:52 EDT Ventricular Rate:  59 PR Interval:  170 QRS Duration: 100 QT Interval:  448 QTC Calculation: 444 R Axis:   15 Text Interpretation: Sinus rhythm No significant change since last tracing Confirmed by Dorie Rank [phone]) on 08/24/2020 2:35:38 PM   Radiology No results found.  Procedures Procedures   Medications Ordered in ED Medications  sodium chloride 0.9 % bolus 1,000 mL (0 mLs Intravenous Stopped 08/24/20 1607)    ED Course  I have reviewed the triage vital signs and the nursing notes.  Pertinent labs & imaging results that were available during my care of the patient were reviewed by me and considered in my medical decision making (see chart for details).  65 year old presents for evaluation of generalized weakness and fatigue.  She has history of dysautonomia and states this feels similar.  She did have IV fluids yesterday which she typically gets weekly per her specialist.  She is afebrile, nonseptic, not ill-appearing.  She has no focal weakness or pain.  She has a nonfocal neuro exam without deficits.  Her heart and lungs are clear.  Her abdomen is soft, nontender.  She has no upper respiratory or any urinary symptoms.  She has a normal musculoskeletal exam.  She does admit to some maxillary sinus pressure, congestion.  Does have history of recurrent sinusitis and does state this feels similar.  Unfortunately she does have some depression which is reasonable given her significant other passed away 1 month ago.  She did start on Lexapro 4 days ago however has been feeling "off" since then.  Her PCP told her to stop taking this  yesterday evening.  She did take her last dose yesterday.  We will plan on labs, imaging and reassess.  Labs and imaging personally reviewed and interpreted:  CBC without leukocytosis CMP mild hyponatremia at 134, Chloride at 97 no additional aggravating or alleviating factors UA negative for infection EKG Wo ischemic changes  Patient reassessed. Symptoms improved. Sx likely due to dysautonomia with possible early sinusitis.  Given IV fluids here.  We will do Augmentin for sinusitis.  Patient has a nonfocal neuro exam.  I have low suspicion for acute CVA, dissection, venous sinus thrombosis, central cause of symptoms.  No chest pain or shortness of breath to suggest ACS.  DC home with close follow-up with her dysautonomia doctor.  She is agreeable for this.  The patient has been appropriately medically screened and/or stabilized in the ED. I have low suspicion for any other emergent medical condition which would require further screening, evaluation or treatment in the ED or require inpatient management.  Patient is hemodynamically stable and in no acute distress.  Patient able to ambulate in department prior to ED.  Evaluation does not show acute pathology that would require ongoing or additional emergent interventions while in the emergency department or further inpatient treatment.  I have discussed the diagnosis with the patient and answered all questions.  Pain is been managed while in the emergency department and patient has no further complaints prior to discharge.  Patient is comfortable with plan discussed in room and is stable for discharge at this time.  I have discussed strict return precautions for returning to the emergency department.  Patient was encouraged to follow-up with PCP/specialist refer to at discharge.   MDM Rules/Calculators/A&P                           Final Clinical Impression(s) /  ED Diagnoses Final diagnoses:  Fatigue, unspecified type  Dehydration  Acute  non-recurrent maxillary sinusitis    Rx / DC Orders ED Discharge Orders         Ordered    amoxicillin-clavulanate (AUGMENTIN) 875-125 MG tablet  Every 12 hours        08/24/20 1631    ondansetron (ZOFRAN ODT) 4 MG disintegrating tablet  Every 8 hours PRN        08/24/20 1631           Dudley Mages A, PA-C 08/24/20 1637    Dorie Rank, MD 08/25/20 0700

## 2020-08-24 NOTE — ED Notes (Signed)
Pt given soda - ok per PA Britni

## 2020-08-24 NOTE — ED Notes (Addendum)
Pt assisted to the bathroom by [facility]

## 2020-08-24 NOTE — ED Triage Notes (Signed)
Pt c/o weakness, elevated BP x 2-3 days, fatigue x week-states she "has low fluids and I get low in sodium and electrolytes and that's what this feels like"-NAD-to tx room via w/c

## 2020-08-24 NOTE — ED Notes (Signed)
Pt ambulated to the bathroom steady gait

## 2020-09-18 ENCOUNTER — Other Ambulatory Visit: Payer: Self-pay | Admitting: Obstetrics & Gynecology

## 2020-09-18 ENCOUNTER — Encounter (INDEPENDENT_AMBULATORY_CARE_PROVIDER_SITE_OTHER): Payer: Medicare Other

## 2020-09-18 ENCOUNTER — Other Ambulatory Visit: Payer: Self-pay

## 2020-09-18 DIAGNOSIS — M8589 Other specified disorders of bone density and structure, multiple sites: Secondary | ICD-10-CM | POA: Diagnosis not present

## 2020-09-18 DIAGNOSIS — Z780 Asymptomatic menopausal state: Secondary | ICD-10-CM | POA: Diagnosis not present

## 2020-10-01 ENCOUNTER — Encounter (HOSPITAL_BASED_OUTPATIENT_CLINIC_OR_DEPARTMENT_OTHER): Payer: Self-pay | Admitting: *Deleted

## 2020-10-01 ENCOUNTER — Other Ambulatory Visit: Payer: Self-pay

## 2020-10-01 ENCOUNTER — Emergency Department (HOSPITAL_BASED_OUTPATIENT_CLINIC_OR_DEPARTMENT_OTHER)
Admission: EM | Admit: 2020-10-01 | Discharge: 2020-10-01 | Disposition: A | Discharge status: Home / Self Care | Payer: Medicare Other | Attending: Emergency Medicine | Admitting: Emergency Medicine

## 2020-10-01 DIAGNOSIS — R531 Weakness: Secondary | ICD-10-CM

## 2020-10-01 DIAGNOSIS — E039 Hypothyroidism, unspecified: Secondary | ICD-10-CM | POA: Insufficient documentation

## 2020-10-01 DIAGNOSIS — R5383 Other fatigue: Secondary | ICD-10-CM | POA: Diagnosis not present

## 2020-10-01 DIAGNOSIS — N3090 Cystitis, unspecified without hematuria: Secondary | ICD-10-CM

## 2020-10-01 DIAGNOSIS — I10 Essential (primary) hypertension: Secondary | ICD-10-CM | POA: Insufficient documentation

## 2020-10-01 DIAGNOSIS — R630 Anorexia: Secondary | ICD-10-CM | POA: Diagnosis not present

## 2020-10-01 DIAGNOSIS — Z79899 Other long term (current) drug therapy: Secondary | ICD-10-CM | POA: Diagnosis not present

## 2020-10-01 LAB — URINALYSIS, MICROSCOPIC (REFLEX): RBC / HPF: NONE SEEN RBC/hpf (ref 0–5)

## 2020-10-01 LAB — COMPREHENSIVE METABOLIC PANEL
ALT: 16 U/L (ref 0–44)
AST: 21 U/L (ref 15–41)
Albumin: 4 g/dL (ref 3.5–5.0)
Alkaline Phosphatase: 90 U/L (ref 38–126)
Anion gap: 9 (ref 5–15)
BUN: 7 mg/dL — ABNORMAL LOW (ref 8–23)
CO2: 27 mmol/L (ref 22–32)
Calcium: 9.3 mg/dL (ref 8.9–10.3)
Chloride: 99 mmol/L (ref 98–111)
Creatinine, Ser: 0.74 mg/dL (ref 0.44–1.00)
GFR, Estimated: >60 mL/min (ref >60)
Glucose, Bld: 106 mg/dL — ABNORMAL HIGH (ref 70–99)
Potassium: 3.9 mmol/L (ref 3.5–5.1)
Sodium: 135 mmol/L (ref 135–145)
Total Bilirubin: 0.9 mg/dL (ref 0.3–1.2)
Total Protein: 7.5 g/dL (ref 6.5–8.1)

## 2020-10-01 LAB — CBC WITH DIFFERENTIAL/PLATELET
Abs Immature Granulocytes: 0.02 10*3/uL (ref 0.00–0.07)
Basophils Absolute: 0.1 10*3/uL (ref 0.0–0.1)
Basophils Relative: 1 %
Eosinophils Absolute: 0.2 10*3/uL (ref 0.0–0.5)
Eosinophils Relative: 2 %
HCT: 42.4 % (ref 36.0–46.0)
Hemoglobin: 14.2 g/dL (ref 12.0–15.0)
Immature Granulocytes: 0 %
Lymphocytes Relative: 27 %
Lymphs Abs: 1.9 10*3/uL (ref 0.7–4.0)
MCH: 33.2 pg (ref 26.0–34.0)
MCHC: 33.5 g/dL (ref 30.0–36.0)
MCV: 99.1 fL (ref 80.0–100.0)
Monocytes Absolute: 0.5 10*3/uL (ref 0.1–1.0)
Monocytes Relative: 6 %
Neutro Abs: 4.6 10*3/uL (ref 1.7–7.7)
Neutrophils Relative %: 64 %
Platelets: 278 10*3/uL (ref 150–400)
RBC: 4.28 MIL/uL (ref 3.87–5.11)
RDW: 12.9 % (ref 11.5–15.5)
WBC: 7.2 10*3/uL (ref 4.0–10.5)
nRBC: 0 % (ref 0.0–0.2)

## 2020-10-01 LAB — URINALYSIS, ROUTINE W REFLEX MICROSCOPIC
Bilirubin Urine: NEGATIVE
Glucose, UA: NEGATIVE mg/dL
Hgb urine dipstick: NEGATIVE
Ketones, ur: NEGATIVE mg/dL
Nitrite: NEGATIVE
Protein, ur: NEGATIVE mg/dL
Specific Gravity, Urine: <1.005 — ABNORMAL LOW (ref 1.005–1.030)
pH: 7.5 (ref 5.0–8.0)

## 2020-10-01 LAB — MAGNESIUM: Magnesium: 2 mg/dL (ref 1.7–2.4)

## 2020-10-01 MED ORDER — AMOXICILLIN-POT CLAVULANATE 875-125 MG PO TABS
1.0000 | ORAL_TABLET | Freq: Once | ORAL | Status: AC
  Administered 2020-10-01: 1 via ORAL
  Filled 2020-10-01: qty 1

## 2020-10-01 MED ORDER — AMOXICILLIN-POT CLAVULANATE 500-125 MG PO TABS: 1.0000 | ORAL_TABLET | Freq: Two times a day (BID) | ORAL | 0 refills | Status: AC

## 2020-10-01 MED ORDER — LACTATED RINGERS IV BOLUS
1000.0000 mL | Freq: Once | INTRAVENOUS | Status: AC
  Administered 2020-10-01: 1000 mL via INTRAVENOUS

## 2020-10-01 NOTE — ED Provider Notes (Signed)
I discussed UA results with patient - possible UTI.  We discussed her extensive antibiotic allergies: she reports augmentin is the only antibiotic she can tolerate.  While this is not first line therapy, it would be reasonable to start this now while awaiting culture results.  Per current dosing guidelines, we can give 500 mg BID for 5 days.   Wyvonnia Dusky, MD 10/01/20 [phone]

## 2020-10-01 NOTE — ED Notes (Signed)
Pt. Reports feeling better since she received fluids

## 2020-10-01 NOTE — ED Notes (Signed)
Pt. Feeling tired and weak due to less food intake and POTS.  Pt. Said she has a lot going on in her life at this time.  No thought of harm to self or others.  Pt. Has recently lost her husband due to cancer.

## 2020-10-01 NOTE — ED Provider Notes (Addendum)
[facility] EMERGENCY DEPARTMENT Provider Note   CSN: [id number] Arrival date & time: 10/01/20  1323     History No chief complaint on file.   Katie [name] is a 65 y.o. female.  Patient is a 65 year old female with a history of POTS, mast cell activation syndrome, hypertension, fibromyalgia, dysautonomia, recurrent dehydration and chronic fatigue syndrome who is presenting today with generalized weakness, fatigue and decreased appetite.  Patient lost her husband approximately 6 weeks ago and has been in a very stressful situation with her daughter and is now not allowed to see her granddaughter.  She reports all this is making her symptoms worse.  She is still sleeping but again has not been eating a whole lot.  She does have a Social worker and is talking with friends.  She is concerned that she has gotten dehydrated and this is making her pots syndrome worse.  She denies any chest pain, shortness of breath, syncope.  Denies any nausea, vomiting or diarrhea.  No new medications and has continued to take her current ones.  The history is provided by the patient.       Past Medical History:  Diagnosis Date  . Anxiety   . Diverticulitis   . Dysautonomia ([place])   . Fibromyalgia   . Hypertension   . Lyme disease   . Mast cell activation syndrome ([place])   . Osteopenia 05/2017   T score -2.2 FRAX 11% / 1.7%  . POTS (postural orthostatic tachycardia syndrome)     Patient Active Problem List   Diagnosis Date Noted  . Dysautonomia ([place])   . POTS (postural orthostatic tachycardia syndrome)   . Hypothyroidism   . Allergic rhinitis   . Diarrhea 11/14/2015  . Acute gastroenteritis 11/14/2015  . Dehydration   . Enteritis 11/13/2015  . Hyponatremia 11/13/2015  . Elevated blood pressure reading 07/14/2013  . Lyme disease 10/25/2012  . Chronic fatigue syndrome 10/25/2012  . Anxiety 10/25/2012  . Fibromyalgia 10/25/2012  . Hormone replacement therapy - followed by Dr. Lonia Skinner in  gyn and by [place] 10/25/2012    Past Surgical History:  Procedure Laterality Date  . Cataract Removal With Lens Implant Placement Left 03-06-2015  . CERVICAL BIOPSY  W/ LOOP ELECTRODE EXCISION  04/19/2004   HGSIL CIN II  MARGINS NEGATIVE  . COLPOSCOPY  02/19/2004   HGSIL CIN II     OB History    Gravida  2   Para  2   Term      Preterm      AB      Living  2     SAB      IAB      Ectopic      Multiple      Live Births              Family History  Problem Relation Age of Onset  . Heart disease Mother        CHF  . Hypertension Mother   . Cancer Father 88       lung cancer  . Hypertension Father   . Hypertension Paternal Grandmother   . Stroke Paternal Grandmother   . Hypertension Paternal Grandfather   . Mental retardation Son     Social History   Tobacco Use  . Smoking status: Never Smoker  . Smokeless tobacco: Never Used  Vaping Use  . Vaping Use: Never used  Substance Use Topics  . Alcohol use: Yes  Comment: occ  . Drug use: No    Home Medications Prior to Admission medications   Medication Sig Start Date End Date Taking? Authorizing Provider  ALPRAZolam Duanne Moron) 0.5 MG tablet Take 0.5 mg by mouth 3 (three) times daily as needed. 09/12/19   [provider]  amoxicillin-clavulanate (AUGMENTIN) 875-125 MG tablet Take 1 tablet by mouth every 12 (twelve) hours. 08/24/20   Henderly, Britni A, PA-C  azelastine (ASTELIN) 0.1 % nasal spray 1 spray by Nasal route 2 times daily. Use in each nostril as directed    [provider]  cetirizine (ZYRTEC ALLERGY) 10 MG tablet Take 10 mg by mouth daily.    [provider]  Cholecalciferol (VITAMIN D-3) 5000 UNITS TABS Take 5,000 tablets by mouth daily.    [provider]  diphenhydrAMINE (BENADRYL ALLERGY) 25 MG tablet Take 25 mg by mouth nightly as needed for Itching.    [provider]  levothyroxine (SYNTHROID) 25 MCG tablet Take 25  mcg by mouth daily. 09/27/19   [provider]  metoprolol succinate (TOPROL-XL) 25 MG 24 hr tablet Take 1 tablet (25 mg total) by mouth daily. 1 TABLET BY MOUTH 4 TIMES A DAY AS NEEDED 10/12/19   Loletha Grayer, MD  montelukast (SINGULAIR) 10 MG tablet Take 10 mg by mouth at bedtime.    [provider]  Multiple Vitamin (MULTI-VITAMIN) tablet Take by mouth.    [provider]  omeprazole (PRILOSEC OTC) 20 MG tablet Take 20 mg by mouth daily.     [provider]  ondansetron (ZOFRAN ODT) 4 MG disintegrating tablet Take 1 tablet (4 mg total) by mouth every 8 (eight) hours as needed for nausea or vomiting. 08/24/20   Henderly, Britni A, PA-C  ondansetron (ZOFRAN) 4 MG tablet Take 1 tablet (4 mg total) by mouth every 6 (six) hours as needed for nausea. 10/12/19   Loletha Grayer, MD  Probiotic Product (PROBIOTIC ADVANCED PO) Take 1 capsule by mouth 2 (two) times daily. 100 BILLION    [provider]  traMADol (ULTRAM) 50 MG tablet tramadol 50 mg tablet 05/19/19   [provider]    Allergies    Epinephrine, Isovue [iopamidol], Sulfa antibiotics, Acidophilus (probiotic) [lactase-lactobacillus], Azithromycin, Ciprofloxacin, Flagyl [metronidazole], Prednisone, Sulfacetamide sodium, and Wheat bran  Review of Systems   Review of Systems  All other systems reviewed and are negative.   Physical Exam Updated Vital Signs BP (!) 147/86 (BP Location: Right Arm)   Pulse 84   Temp 98 F (36.7 C) (Oral)   Resp 18   Ht 5\' 7"  (1.702 m)   Wt 70.2 kg   LMP 03/15/2013   SpO2 100%   BMI 24.24 kg/m   Physical Exam Vitals and nursing note reviewed.  Constitutional:      General: She is not in acute distress.    Appearance: Normal appearance. She is well-developed.  HENT:     Head: Normocephalic and atraumatic.     Mouth/Throat:     Mouth: Mucous membranes are moist.  Eyes:     Pupils: Pupils are equal, round, and reactive to light.  Cardiovascular:      Rate and Rhythm: Normal rate and regular rhythm.     Heart sounds: Normal heart sounds. No murmur heard. No friction rub.  Pulmonary:     Effort: Pulmonary effort is normal.     Breath sounds: Normal breath sounds. No wheezing or rales.  Abdominal:     General: Bowel sounds are normal. There  is no distension.     Palpations: Abdomen is soft.     Tenderness: There is no abdominal tenderness. There is no guarding or rebound.  Musculoskeletal:        General: No tenderness. Normal range of motion.     Right lower leg: No edema.     Left lower leg: No edema.     Comments: No edema  Skin:    General: Skin is warm and dry.     Capillary Refill: Capillary refill takes less than 2 seconds.     Findings: No rash.  Neurological:     Mental Status: She is alert and oriented to person, place, and time. Mental status is at baseline.     Cranial Nerves: No cranial nerve deficit.  Psychiatric:        Mood and Affect: Mood normal.        Behavior: Behavior normal.        Thought Content: Thought content normal.     ED Results / Procedures / Treatments   Labs (all labs ordered are listed, but only abnormal results are displayed) Labs Reviewed  COMPREHENSIVE METABOLIC PANEL - Abnormal; Notable for the following components:      Result Value   Glucose, Bld 106 (*)    BUN 7 (*)    All other components within normal limits  CBC WITH DIFFERENTIAL/PLATELET  MAGNESIUM  URINALYSIS, ROUTINE W REFLEX MICROSCOPIC    EKG EKG Interpretation  Date/Time:  Monday Oct 01 2020 13:52:43 EDT Ventricular Rate:  83 PR Interval:  167 QRS Duration: 94 QT Interval:  392 QTC Calculation: 461 R Axis:   15 Text Interpretation: Sinus rhythm No significant change since last tracing Confirmed by Blanchie Dessert [phone]) on 10/01/2020 2:28:14 PM   Radiology No results found.  Procedures Procedures   Medications Ordered in ED Medications  lactated ringers bolus 1,000 mL (1,000 mLs Intravenous New  Bag/Given 10/01/20 1406)    ED Course  I have reviewed the triage vital signs and the nursing notes.  Pertinent labs & imaging results that were available during my care of the patient were reviewed by me and considered in my medical decision making (see chart for details).    MDM Rules/Calculators/A&P                          Patient presenting today with complaints of weakness and feeling dehydrated.  Patient has numerous medical problems and this does not seem to happen recurrently especially during episodes of high stress.  Patient has recently lost her husband and is currently in a stressful situation with her daughter.  She is having no chest pain, shortness of breath, nausea, vomiting but has had poor oral intake and thinks she got dehydrated when she was horseback riding the other day when it was very warm.  Vital signs are reassuring on exam.  She has no abdominal pain and is awake and alert.  Patient labs are pending.  She denies any symptoms consistent with infection.  Will ensure normal electrolytes.  She was given a liter of fluid.  EKG reassuring.  3:23 PM Blood work is normal.  UA is pending. Final Clinical Impression(s) / ED Diagnoses Final diagnoses:  Weakness    Rx / DC Orders ED Discharge Orders    None       Blanchie Dessert, MD 10/01/20 1523    Blanchie Dessert, MD 10/01/20 1534

## 2020-10-01 NOTE — Discharge Instructions (Addendum)
Your urine results showed some possible signs of an infection.  It is not clear at this time whether this is a confirmed urine infection or a contaminated sample.  Your urine culture will take a few days to result.    We started you on Augmentin for an infection due to your extensive antibiotic allergies.  You may receive a phone call from our pharmacist or staff if your urine bacteria has resistance to Augmentin and if you need an alternative.  Keep making sure you are eating.  Avoid getting overheated.  Avoid excessive alcohol.  Continue to follow with your doctors.  All your labs are normal today.

## 2020-10-01 NOTE — ED Triage Notes (Signed)
States she has "POTS". Her husband just died last week. She wants to see if she is dehydrated.

## 2020-10-01 NOTE — ED Notes (Signed)
Called lab to add urine culture to urine already sent

## 2020-10-03 ENCOUNTER — Ambulatory Visit: Payer: Self-pay | Admitting: *Deleted

## 2020-10-03 LAB — URINE CULTURE

## 2020-10-03 NOTE — Telephone Encounter (Signed)
Sam, RN charge nurse from [facility] called to verify information reviewed with patient on recollection of urine sample. NT reviewed options with patient after patient reviewed urine culture results via My Chart today . Patient would need to check at [facility] if recollection of urine culture ordered. Sam, RN verbalized understanding of information given from NT to follow up with PCP , or go back to [place] or go to UC if she wanted recollection of urine culture.

## 2020-10-03 NOTE — Telephone Encounter (Signed)
Patient called to request advise regarding  Lab results of urine culture collected 2 days ago at [facility].  Patient viewed results on My Chart 10/03/20 at 11:49 am. Patient would like to know if she can go to a UC instead of waiting for hours at [facility] for recollection of urine as result suggested. Patient has not received a call from [facility] regarding urine results form staff. Patient reports she can not be seen by her PCP as suggested, for 2 more weeks. Patient is going out of town to [place] to a funeral and does not want to wait for 2 weeks for PCP. Patient reports she does not tolerate certain antibiotics and has had reactions to many antibiotics in the past. Recommended to see PCP and patient states she has tried and no available appt . Encouraged patient to go to [place] and ask if she can go in only to get a recollection of urine. Suggested patient can also try UC affiliated with [facility] and ask if she can be seen to collect another urine culture sample. Patient may be required to have another encounter to be seen. [facility] will have access to patient's chart and see encounter from ED 10/01/20. Patient reports she will check with both areas and call back if needed.

## 2020-10-16 ENCOUNTER — Telehealth: Payer: Self-pay

## 2020-10-16 NOTE — Telephone Encounter (Signed)
Patient called for bone density results from April 2022. I told her report indicated that Katie [name] had mailed result out to her yesterday.  I did let her know that result showed osteopenia again and Dr. Marguerita Merles rec continue current regimen and repeat DEXA in 2 years.  She will be here for AEX later this year and will let Dr. Marguerita Merles know if any questions.

## 2020-10-18 ENCOUNTER — Other Ambulatory Visit: Payer: Self-pay

## 2020-10-18 ENCOUNTER — Encounter: Payer: Self-pay | Admitting: Obstetrics & Gynecology

## 2020-10-18 ENCOUNTER — Other Ambulatory Visit (HOSPITAL_COMMUNITY)
Admission: RE | Admit: 2020-10-18 | Discharge: 2020-10-18 | Disposition: A | Discharge status: Home / Self Care | Payer: Medicare Other | Source: Ambulatory Visit | Attending: Obstetrics & Gynecology | Admitting: Obstetrics & Gynecology

## 2020-10-18 ENCOUNTER — Ambulatory Visit (INDEPENDENT_AMBULATORY_CARE_PROVIDER_SITE_OTHER): Payer: Medicare Other | Admitting: Obstetrics & Gynecology

## 2020-10-18 VITALS — BP 150/82 | HR 90

## 2020-10-18 DIAGNOSIS — Z01419 Encounter for gynecological examination (general) (routine) without abnormal findings: Secondary | ICD-10-CM

## 2020-10-18 DIAGNOSIS — R300 Dysuria: Secondary | ICD-10-CM

## 2020-10-18 DIAGNOSIS — Z9189 Other specified personal risk factors, not elsewhere classified: Secondary | ICD-10-CM | POA: Diagnosis not present

## 2020-10-18 DIAGNOSIS — M8589 Other specified disorders of bone density and structure, multiple sites: Secondary | ICD-10-CM

## 2020-10-18 DIAGNOSIS — Z780 Asymptomatic menopausal state: Secondary | ICD-10-CM

## 2020-10-18 LAB — URINALYSIS, COMPLETE W/RFL CULTURE
Bacteria, UA: NONE SEEN /HPF
Bilirubin Urine: NEGATIVE
Glucose, UA: NEGATIVE
Hgb urine dipstick: NEGATIVE
Hyaline Cast: NONE SEEN /LPF
Ketones, ur: NEGATIVE
Leukocyte Esterase: NEGATIVE
Nitrites, Initial: NEGATIVE
Protein, ur: NEGATIVE
RBC / HPF: NONE SEEN /HPF (ref 0–2)
Specific Gravity, Urine: 1.003 (ref 1.001–1.035)
WBC, UA: NONE SEEN /HPF (ref 0–5)
pH: 8.5 — ABNORMAL HIGH (ref 5.0–8.0)

## 2020-10-18 LAB — NO CULTURE INDICATED

## 2020-10-18 NOTE — Progress Notes (Signed)
Katie [name] 1955/07/03 [id number]   History:    65 y.o. G2P2L2 Widowed (second husband died of lung cancer in Jul 26, 2020)  Son with Autism.  Daughter not helpful with grieving.   RP:  Established patient presenting for annual gyn exam   HPI: Difficult grieving.  Postmenopause, well on no HRT.  No PMB.  No pelvic pain.  H/O LEEP >15 yrs ago, paps normal since then.  Currently abstinent.  Screening Mammo 09/2019: Rt Breast normal.  Lt Breast with Ca++ at the upper/outer quadrant, Lt Dx mammo done 10/04/2019, Lt breast Bx was benign.  Urine/BMs wnl.  Health labs with [facility] every 3 yrs.  Past medical history,surgical history, family history and social history were all reviewed and documented in the EPIC chart.  Gynecologic History Patient's last menstrual period was 03/15/2013.  Obstetric History OB History  Gravida Para Term Preterm AB Living  2 2       2   SAB IAB Ectopic Multiple Live Births               # Outcome Date GA Lbr Len/2nd Weight Sex Delivery Anes PTL Lv  2 Para           1 Para              ROS: A ROS was performed and pertinent positives and negatives are included in the history.  GENERAL: No fevers or chills. HEENT: No change in vision, no earache, sore throat or sinus congestion. NECK: No pain or stiffness. CARDIOVASCULAR: No chest pain or pressure. No palpitations. PULMONARY: No shortness of breath, cough or wheeze. GASTROINTESTINAL: No abdominal pain, nausea, vomiting or diarrhea, melena or bright red blood per rectum. GENITOURINARY: No urinary frequency, urgency, hesitancy or dysuria. MUSCULOSKELETAL: No joint or muscle pain, no back pain, no recent trauma. DERMATOLOGIC: No rash, no itching, no lesions. ENDOCRINE: No polyuria, polydipsia, no heat or cold intolerance. No recent change in weight. HEMATOLOGICAL: No anemia or easy bruising or bleeding. NEUROLOGIC: No headache, seizures, numbness, tingling or weakness. PSYCHIATRIC: No depression, no loss of interest  in normal activity or change in sleep pattern.     Exam:   BP (!) 150/82   Pulse 90   LMP 03/15/2013   General appearance : Well developed well nourished female. No acute distress HEENT: Eyes: no retinal hemorrhage or exudates,  Neck supple, trachea midline, no carotid bruits, no thyroidmegaly Lungs: Clear to auscultation, no rhonchi or wheezes, or rib retractions  Heart: Regular rate and rhythm, no murmurs or gallops Breast:Examined in sitting and supine position were symmetrical in appearance, no palpable masses or tenderness,  no skin retraction, no nipple inversion, no nipple discharge, no skin discoloration, no axillary or supraclavicular lymphadenopathy Abdomen: no palpable masses or tenderness, no rebound or guarding Extremities: no edema or skin discoloration or tenderness  Pelvic: Vulva: Normal             Vagina: No gross lesions or discharge  Cervix: No gross lesions or discharge.  Pap reflex done.  Uterus  AV, normal size, shape and consistency, non-tender and mobile  Adnexa  Without masses or tenderness  Anus: Normal  U/A Negative   Assessment/Plan:  65 y.o. female for annual exam   1. Encounter for routine gynecological examination with Papanicolaou smear of cervix Normal gynecologic exam in menopause.  Pap reflex done.  Breast exam normal.  Patient had a benign Lt breast biopsy in July 27, 2019.  Will follow-up with a scheduled  93-month follow-up mammogram.  Doing Cologuard every 3 years.  Health labs with family physician. - Cytology - PAP( [name])  2. Postmenopausal Well on no hormone replacement therapy.  No postmenopausal bleeding.  3. Osteopenia of multiple sites Osteopenia with lowest T score at the left femoral neck at -2.2.  Stable bone density compared to 3 years ago.  Continue with vitamin D supplements, calcium intake of 1.5 g/day total and regular weightbearing physical activities.  4. Dysuria Urine analysis negative, patient reassured. -  Urinalysis,Complete w/RFL Culture  Other orders - montelukast (SINGULAIR) 10 MG tablet; 1 tablet in the evening   Princess Bruins MD, 2:52 PM 10/18/2020

## 2020-10-19 LAB — CYTOLOGY - PAP: Diagnosis: NEGATIVE

## 2021-03-04 ENCOUNTER — Other Ambulatory Visit: Payer: Self-pay

## 2021-03-04 ENCOUNTER — Emergency Department (HOSPITAL_BASED_OUTPATIENT_CLINIC_OR_DEPARTMENT_OTHER)
Admission: EM | Admit: 2021-03-04 | Discharge: 2021-03-04 | Disposition: A | Discharge status: Home / Self Care | Payer: Medicare Other | Attending: Emergency Medicine | Admitting: Emergency Medicine

## 2021-03-04 ENCOUNTER — Encounter (HOSPITAL_BASED_OUTPATIENT_CLINIC_OR_DEPARTMENT_OTHER): Payer: Self-pay | Admitting: Emergency Medicine

## 2021-03-04 DIAGNOSIS — W5501XA Bitten by cat, initial encounter: Secondary | ICD-10-CM | POA: Diagnosis not present

## 2021-03-04 DIAGNOSIS — Z5321 Procedure and treatment not carried out due to patient leaving prior to being seen by health care provider: Secondary | ICD-10-CM | POA: Insufficient documentation

## 2021-03-04 DIAGNOSIS — Z20822 Contact with and (suspected) exposure to covid-19: Secondary | ICD-10-CM | POA: Diagnosis not present

## 2021-03-04 DIAGNOSIS — R531 Weakness: Secondary | ICD-10-CM | POA: Diagnosis present

## 2021-03-04 DIAGNOSIS — I10 Essential (primary) hypertension: Secondary | ICD-10-CM | POA: Diagnosis not present

## 2021-03-04 LAB — RESP PANEL BY RT-PCR (FLU A&B, COVID) ARPGX2
Influenza A by PCR: NEGATIVE
Influenza B by PCR: NEGATIVE
SARS Coronavirus 2 by RT PCR: NEGATIVE

## 2021-03-04 NOTE — ED Triage Notes (Signed)
Pt c/o weakness and hypertension today, pt also reports cat bite to right thumb

## 2021-03-31 ENCOUNTER — Emergency Department (HOSPITAL_BASED_OUTPATIENT_CLINIC_OR_DEPARTMENT_OTHER): Payer: Medicare Other

## 2021-03-31 ENCOUNTER — Encounter (HOSPITAL_BASED_OUTPATIENT_CLINIC_OR_DEPARTMENT_OTHER): Payer: Self-pay | Admitting: *Deleted

## 2021-03-31 ENCOUNTER — Emergency Department (HOSPITAL_BASED_OUTPATIENT_CLINIC_OR_DEPARTMENT_OTHER)
Admission: EM | Admit: 2021-03-31 | Discharge: 2021-03-31 | Disposition: A | Discharge status: Home / Self Care | Payer: Medicare Other | Attending: Emergency Medicine | Admitting: Emergency Medicine

## 2021-03-31 ENCOUNTER — Other Ambulatory Visit: Payer: Self-pay

## 2021-03-31 DIAGNOSIS — W231XXA Caught, crushed, jammed, or pinched between stationary objects, initial encounter: Secondary | ICD-10-CM | POA: Diagnosis not present

## 2021-03-31 DIAGNOSIS — E039 Hypothyroidism, unspecified: Secondary | ICD-10-CM | POA: Diagnosis not present

## 2021-03-31 DIAGNOSIS — S66912A Strain of unspecified muscle, fascia and tendon at wrist and hand level, left hand, initial encounter: Secondary | ICD-10-CM

## 2021-03-31 DIAGNOSIS — Z79899 Other long term (current) drug therapy: Secondary | ICD-10-CM | POA: Diagnosis not present

## 2021-03-31 DIAGNOSIS — S66902A Unspecified injury of unspecified muscle, fascia and tendon at wrist and hand level, left hand, initial encounter: Secondary | ICD-10-CM | POA: Insufficient documentation

## 2021-03-31 DIAGNOSIS — S6992XA Unspecified injury of left wrist, hand and finger(s), initial encounter: Secondary | ICD-10-CM | POA: Diagnosis present

## 2021-03-31 DIAGNOSIS — I10 Essential (primary) hypertension: Secondary | ICD-10-CM | POA: Insufficient documentation

## 2021-03-31 MED ORDER — IBUPROFEN 400 MG PO TABS
600.0000 mg | ORAL_TABLET | Freq: Once | ORAL | Status: AC
  Administered 2021-03-31: 13:00:00 600 mg via ORAL
  Filled 2021-03-31: qty 1

## 2021-03-31 NOTE — ED Provider Notes (Signed)
[facility] Provider Note   CSN: [id number] Arrival date & time: 03/31/21  1145     History Chief Complaint  Patient presents with   Wrist Pain    Katie [name] is a 65 y.o. female.  Presents for left wrist pain.  States the wrist was injured yesterday at a car dealership when her wrist was caught in a car door as it was being closed.  Had severe pain for the first 30 minutes and then seemed to subside.  However today it still has been bothering her quite a bit.  Pain currently moderate, worse with movement and improved with rest.  Also reports pain in her right buttocks region after a fall 1 week ago.  Gentle slide to the ground, has been ambulatory since this incident.  HPI     Past Medical History:  Diagnosis Date   Anxiety    Diverticulitis    Dysautonomia ([place])    Fibromyalgia    Hypertension    Lyme disease    Mast cell activation syndrome ([place])    Osteopenia 05/2017   T score -2.2 FRAX 11% / 1.7%   POTS (postural orthostatic tachycardia syndrome)     Patient Active Problem List   Diagnosis Date Noted   Dysautonomia ([place])    POTS (postural orthostatic tachycardia syndrome)    Hypothyroidism    Allergic rhinitis    Diarrhea 11/14/2015   Acute gastroenteritis 11/14/2015   Dehydration    Enteritis 11/13/2015   Hyponatremia 11/13/2015   Elevated blood pressure reading 07/14/2013   Lyme disease 10/25/2012   Chronic fatigue syndrome 10/25/2012   Anxiety 10/25/2012   Fibromyalgia 10/25/2012   Hormone replacement therapy - followed by Dr. Lonia Skinner in gyn and by [place] 10/25/2012    Past Surgical History:  Procedure Laterality Date   Cataract Removal With Lens Implant Placement Left 03-06-2015   CERVICAL BIOPSY  W/ LOOP ELECTRODE EXCISION  04/19/2004   HGSIL CIN II  MARGINS NEGATIVE   COLPOSCOPY  02/19/2004   HGSIL CIN II     OB History     Gravida  2   Para  2   Term      Preterm      AB       Living  2      SAB      IAB      Ectopic      Multiple      Live Births              Family History  Problem Relation Age of Onset   Heart disease Mother        CHF   Hypertension Mother    Cancer Father 78       lung cancer   Hypertension Father    Hypertension Paternal Grandmother    Stroke Paternal Grandmother    Hypertension Paternal Grandfather    Mental retardation Son     Social History   Tobacco Use   Smoking status: Never   Smokeless tobacco: Never  Vaping Use   Vaping Use: Never used  Substance Use Topics   Alcohol use: Yes    Comment: occ   Drug use: No    Home Medications Prior to Admission medications   Medication Sig Start Date End Date Taking? Authorizing Provider  ALPRAZolam Duanne Moron) 0.5 MG tablet Take 0.5 mg by mouth 3 (three) times daily as needed. 09/12/19   [provider]  azelastine (ASTELIN) 0.1 % nasal spray 1 spray by Nasal route 2 times daily. Use in each nostril as directed    [provider]  cetirizine (ZYRTEC) 10 MG tablet Take 10 mg by mouth daily.    [provider]  Cholecalciferol (VITAMIN D-3) 5000 UNITS TABS Take 5,000 tablets by mouth daily.    [provider]  diphenhydrAMINE (BENADRYL) 25 MG tablet Take 25 mg by mouth nightly as needed for Itching.    [provider]  levothyroxine (SYNTHROID) 25 MCG tablet Take 25 mcg by mouth daily. 09/27/19   [provider]  metoprolol succinate (TOPROL-XL) 25 MG 24 hr tablet Take 1 tablet (25 mg total) by mouth daily. 1 TABLET BY MOUTH 4 TIMES A DAY AS NEEDED 10/12/19   Loletha Grayer, MD  montelukast (SINGULAIR) 10 MG tablet Take 10 mg by mouth at bedtime.    [provider]  montelukast (SINGULAIR) 10 MG tablet 1 tablet in the evening 07/23/16   [provider]  Multiple Vitamin (MULTI-VITAMIN) tablet Take by mouth.    [provider]  omeprazole (PRILOSEC OTC) 20 MG tablet Take 20 mg by mouth daily.      [provider]  ondansetron (ZOFRAN ODT) 4 MG disintegrating tablet Take 1 tablet (4 mg total) by mouth every 8 (eight) hours as needed for nausea or vomiting. 08/24/20   Henderly, Britni A, PA-C  Probiotic Product (PROBIOTIC ADVANCED PO) Take 1 capsule by mouth 2 (two) times daily. 100 BILLION    [provider]    Allergies    Epinephrine, Isovue [iopamidol], Sulfa antibiotics, Acidophilus (probiotic) [lactase-lactobacillus], Azithromycin, Ciprofloxacin, Flagyl [metronidazole], Prednisone, Sulfacetamide sodium, and Wheat bran  Review of Systems   Review of Systems  Musculoskeletal:  Positive for arthralgias.  All other systems reviewed and are negative.  Physical Exam Updated Vital Signs BP (!) 145/88 (BP Location: Right Arm)   Pulse 88   Temp 97.9 F (36.6 C) (Oral)   Resp 18   LMP 03/15/2013   Physical Exam Vitals and nursing note reviewed.  Constitutional:      General: Katie [name] is not in acute distress.    Appearance: Katie [name] is well-developed.  HENT:     Head: Normocephalic and atraumatic.  Eyes:     Conjunctiva/sclera: Conjunctivae normal.  Cardiovascular:     Rate and Rhythm: Normal rate.     Pulses: Normal pulses.  Pulmonary:     Effort: Pulmonary effort is normal. No respiratory distress.  Musculoskeletal:        General: No swelling.     Cervical back: Neck supple.     Comments: Back: some ttp to right lower lumbar region but no deformity RUE: no TTP throughout, no deformity, normal joint ROM, radial pulse intact, distal sensation and motor intact LUE: some ttp to wrist, slight swelling to wrist joint, radial pulse intact, distal sensation and motor intact RLE:  no TTP throughout, no deformity, normal joint ROM, distal pulse, sensation and motor intact LLE: no TTP throughout, no deformity, normal joint ROM, distal pulse, sensation and motor intact  Skin:    General: Skin is warm and dry.     Capillary Refill: Capillary refill takes less than 2  seconds.  Neurological:     Mental Status: Katie [name] is alert.  Psychiatric:        Mood and Affect: Mood normal.    ED Results / Procedures / Treatments   Labs (all labs ordered are listed, but only abnormal results are  displayed) Labs Reviewed - No data to display  EKG None  Radiology DG Wrist Complete Left  Result Date: 03/31/2021 CLINICAL DATA:  Slammed left wrist in car door yesterday. Now with pain and swelling to the ulnar process. EXAM: LEFT WRIST - COMPLETE 3+ VIEW COMPARISON:  None. FINDINGS: There is no evidence of fracture or dislocation. Mild osteoarthritis at the thumb CMC joint. Mild soft tissue swelling at the ulnar aspect of the wrist. IMPRESSION: No acute osseous abnormality of the left wrist. Electronically Signed   By: Ileana Roup M.D.   On: 03/31/2021 12:58    Procedures Procedures   Medications Ordered in ED Medications  ibuprofen (ADVIL) tablet 600 mg (600 mg Oral Given 03/31/21 1253)    ED Course  I have reviewed the triage vital signs and the nursing notes.  Pertinent labs & imaging results that were available during my care of the patient were reviewed by me and considered in my medical decision making (see chart for details).    MDM Rules/Calculators/A&P                            65 year old presents for left wrist injury.  X-ray negative.  Neurovascularly intact.  Mild swelling noted, suspect strain/sprain.  Provided brace for support.  Katie [name] also reported mild fall 1 week ago causing some right buttocks pain.  Has slight tenderness to palpation in the right buttocks region and right lower lumbar region but no midline tenderness.  No tenderness over hip itself, is ambulatory.  Doubt clinically significant injury.  Suspect more likely MSK strain.  Recommend supportive care.    After the discussed management above, the patient was determined to be safe for discharge.  The patient was in agreement with this plan and all questions regarding their care  were answered.  ED return precautions were discussed and the patient will return to the ED with any significant worsening of condition.  Final Clinical Impression(s) / ED Diagnoses Final diagnoses:  Wrist strain, left, initial encounter    Rx / DC Orders ED Discharge Orders     None        Lucrezia Starch, MD 03/31/21 1949

## 2021-03-31 NOTE — ED Notes (Signed)
EDP at [facility] 

## 2021-03-31 NOTE — ED Notes (Signed)
ED PA at [facility] 

## 2021-03-31 NOTE — Discharge Instructions (Signed)
Recommend Tylenol or Motrin for pain control.  Follow-up with primary doctor as needed.  Recommend bearing weight and using your wrist as tolerated.  Can use the brace for support.

## 2021-03-31 NOTE — ED Notes (Addendum)
C/o R buttocks/rump contusion and /or strain s/p fall 1 week ago. Describes fall as gentile gliding, not hard impact. Has tried ice and heat. Also primarily L wrist pin and swelling. CMS, ROM, skin intact.

## 2021-03-31 NOTE — ED Triage Notes (Signed)
Left wrist, closed car door on left wrist. No complaints with C, M or S. Was able to type last night

## 2021-04-08 IMAGING — DX CHEST - 2 VIEW
2 series · 2 of 2 positions shown · non-contrast
Comparison: Prior radiograph from 10/06/2016.

CLINICAL DATA: Initial evaluation for acute weakness.

EXAM:
CHEST - 2 VIEW

[chest pa]
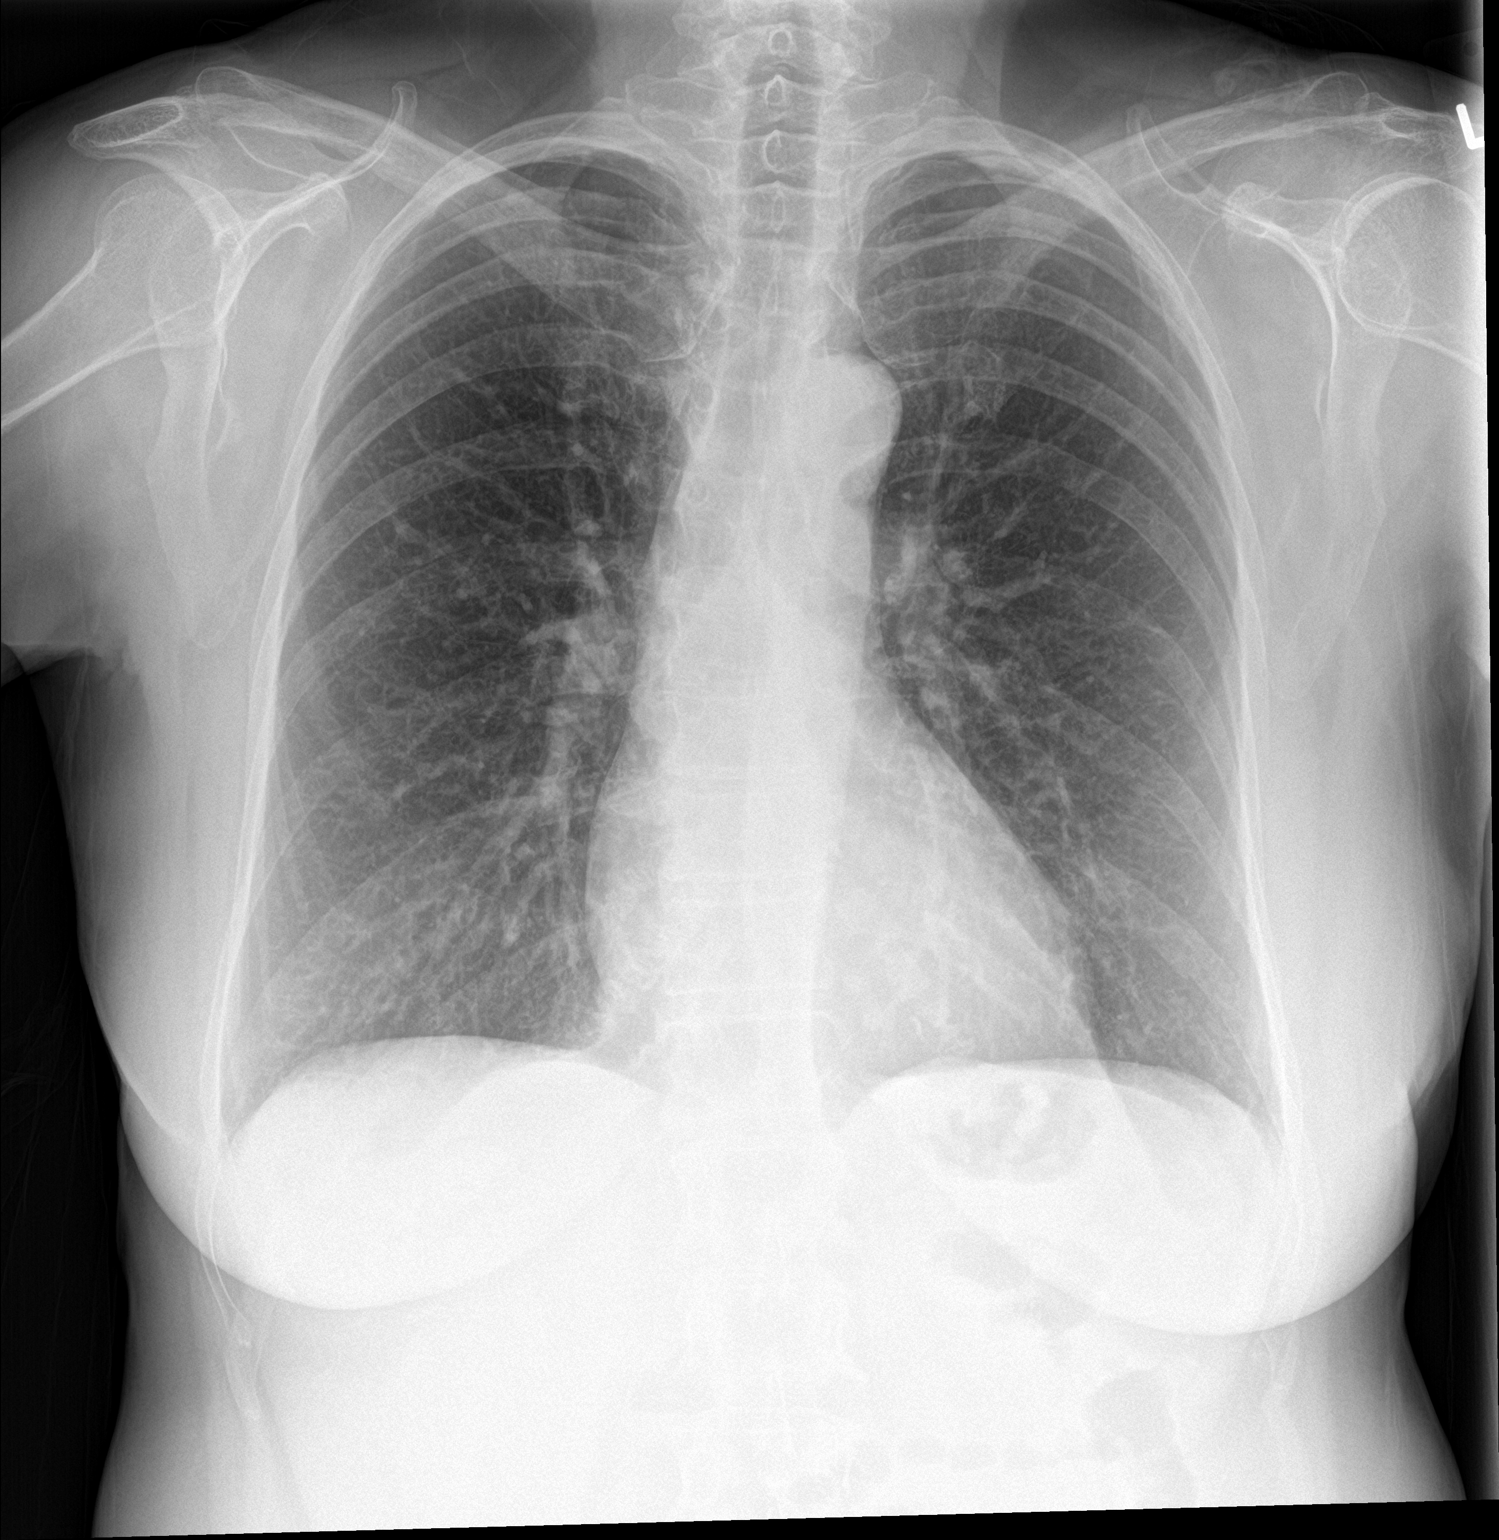

[chest lat]
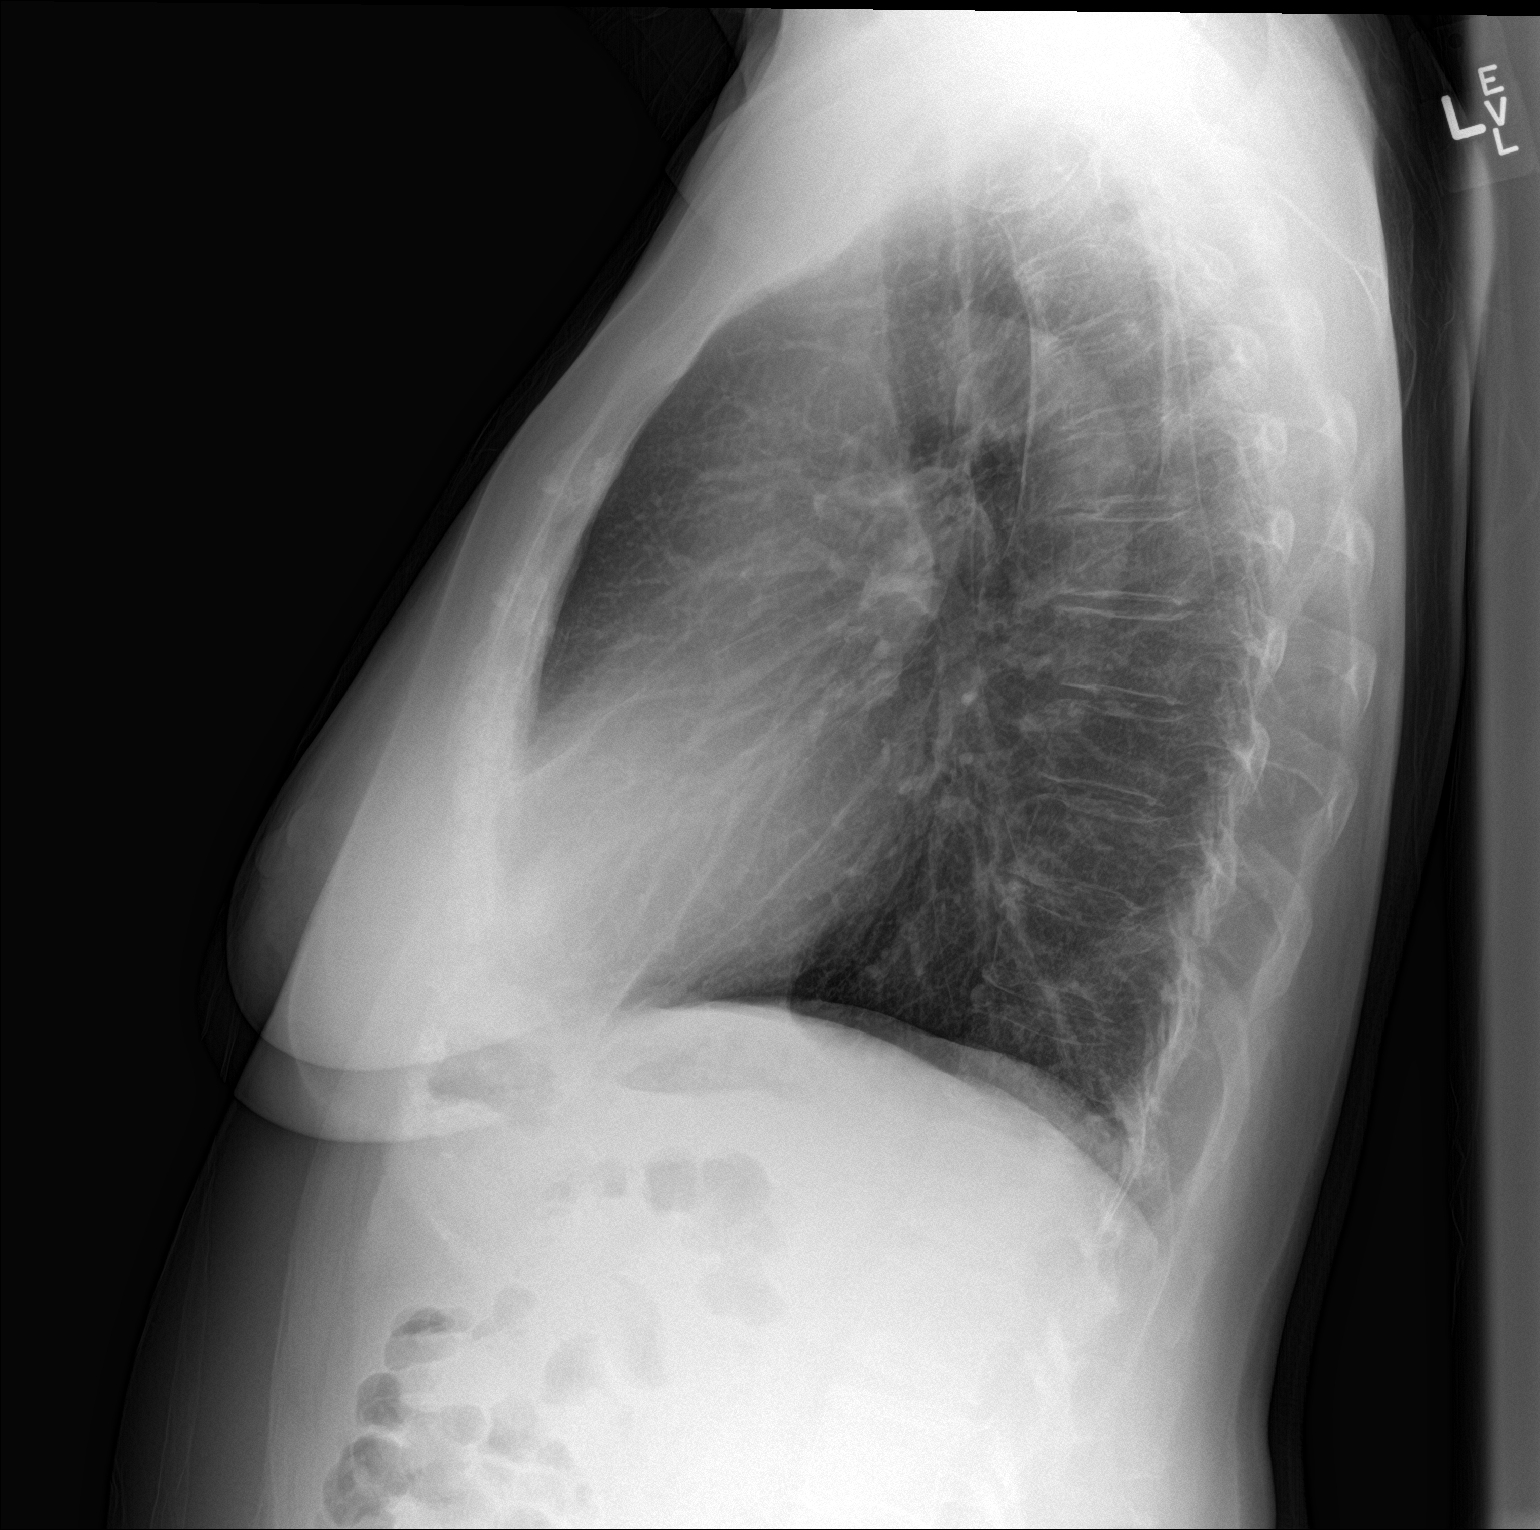

[2 of 2 positions shown; findings below may reference images not displayed]

FINDINGS: The cardiac and mediastinal silhouettes are stable in size and
contour, and remain within normal limits.

The lungs are normally inflated. Mild perihilar vascular congestion
without pulmonary edema. No pleural effusion. No focal infiltrates.
No pneumothorax.

No acute osseous finding.

No acute osseous abnormality identified.
IMPRESSION: No active cardiopulmonary disease.

## 2021-10-22 ENCOUNTER — Ambulatory Visit: Payer: Medicare Other | Admitting: Obstetrics & Gynecology

## 2021-10-24 ENCOUNTER — Ambulatory Visit: Payer: Medicare Other | Admitting: Obstetrics & Gynecology

## 2021-11-13 ENCOUNTER — Ambulatory Visit: Payer: Medicare Other | Admitting: Obstetrics & Gynecology

## 2021-12-18 ENCOUNTER — Ambulatory Visit: Payer: Medicare Other | Admitting: Obstetrics & Gynecology

## 2022-03-22 IMAGING — CT CT ABD-PELV W/O CM
2 of 4 series · 14 of 46 positions shown, 16 images · non-contrast
Comparison: CT 11/26/2017
COMPARISON: CT 11/26/2017

Addendum:
CLINICAL DATA: Vomiting and diarrhea. Abdominal cramping.
Diverticulitis suspected.

EXAM:
CT ABDOMEN AND PELVIS WITHOUT CONTRAST
TECHNIQUE: Multidetector CT imaging of the abdomen and pelvis was performed
following the standard protocol without IV contrast.

[Series 3: routine abd/pel wo · axial · 0.86mm/px · z∈[-1128,-743]mm · 11 of 93 slices shown, 13 images]
[im 8/93  soft-tissue]
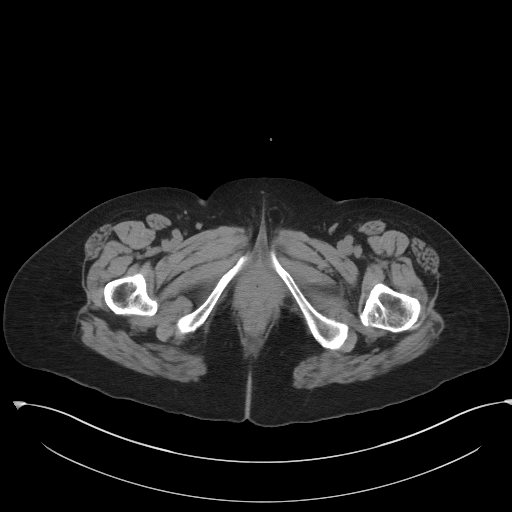
[im 8/93  bone]
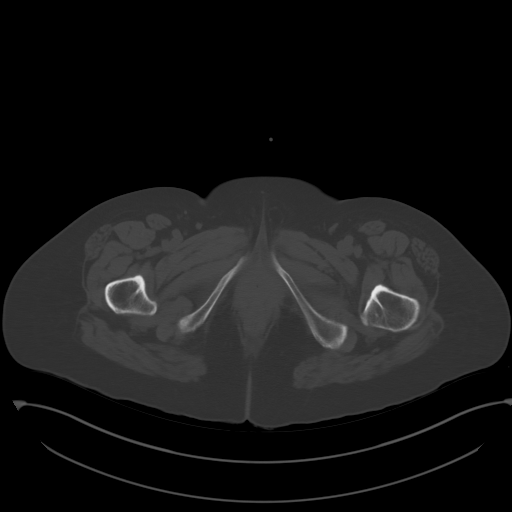
[im 15/93  soft-tissue]
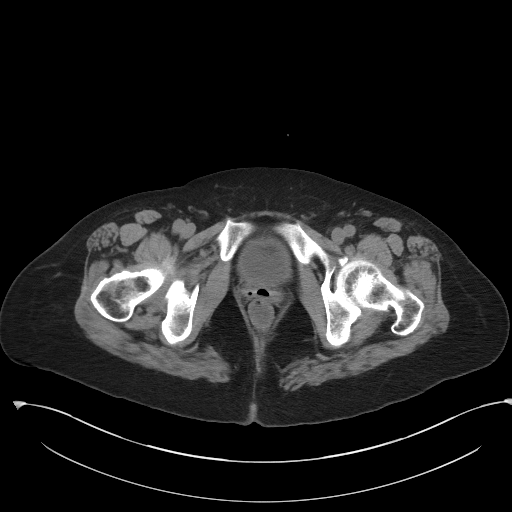
[im 22/93  soft-tissue]
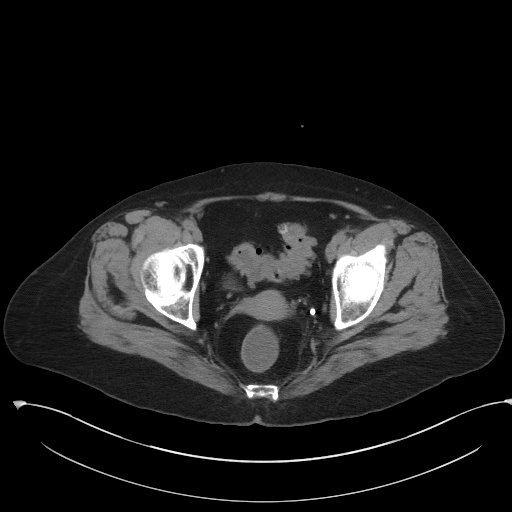
[im 29/93  soft-tissue]
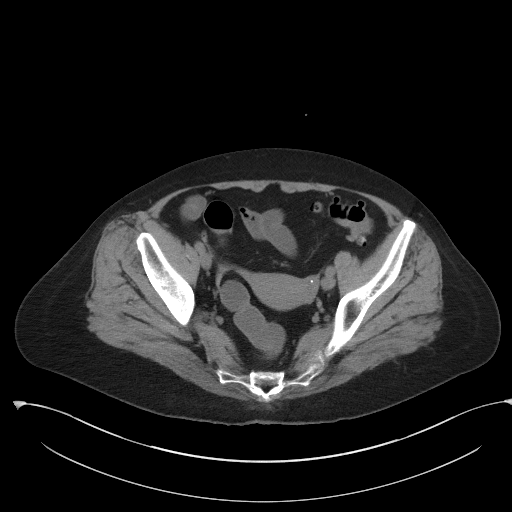
[im 39/93  soft-tissue]
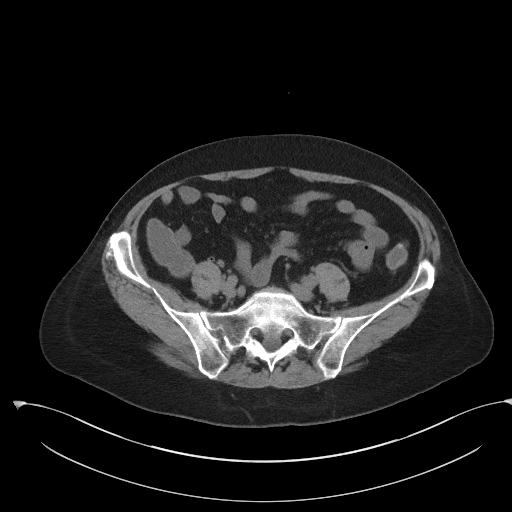
[im 47/93  soft-tissue]
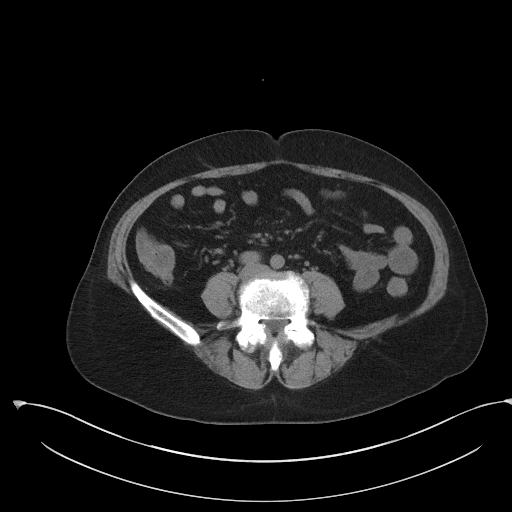
[im 54/93  soft-tissue]
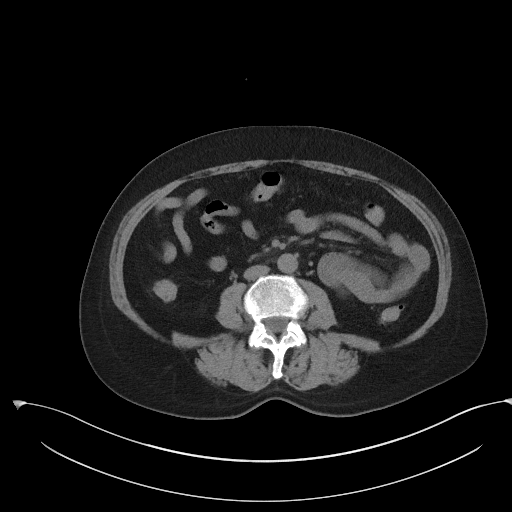
[im 64/93  soft-tissue]
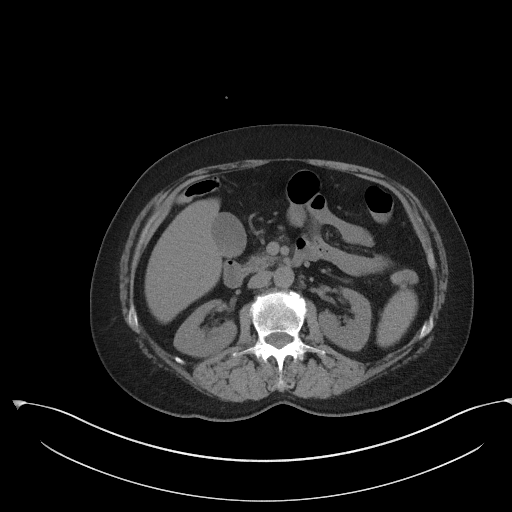
[im 71/93  soft-tissue]
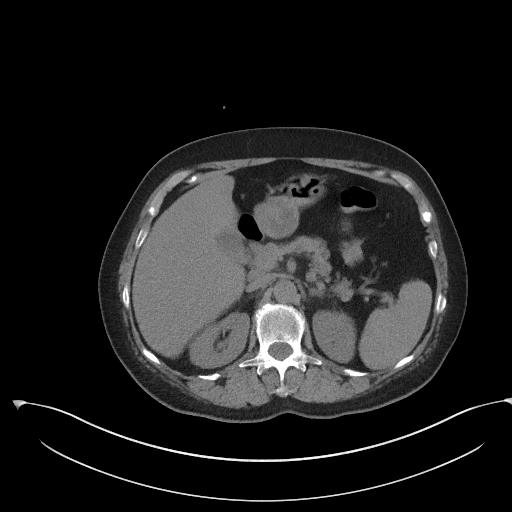
[im 71/93  bone]
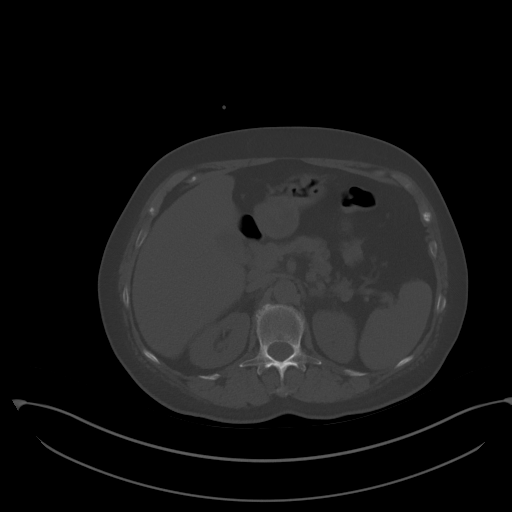
[im 78/93  soft-tissue]
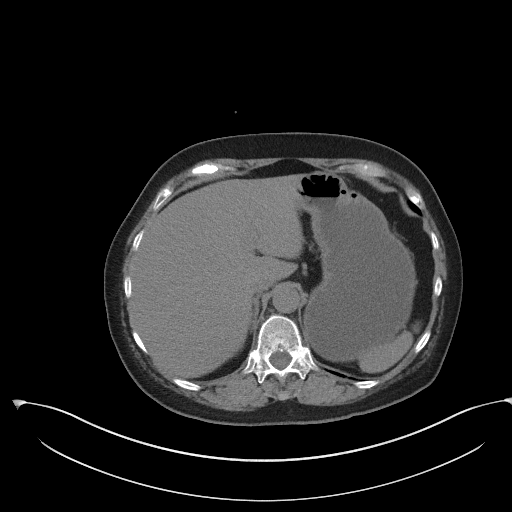
[im 85/93  soft-tissue]
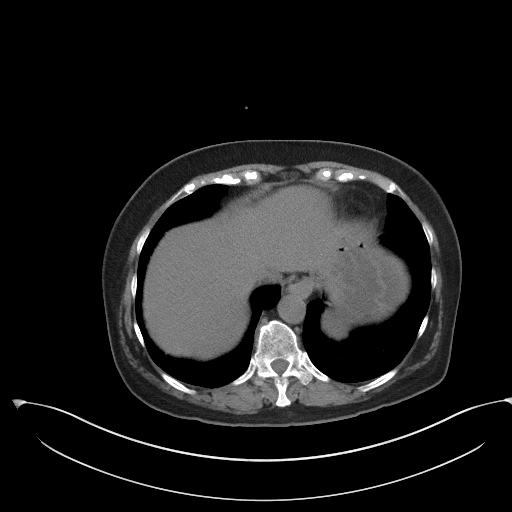

[Series 6: coronal st · coronal · 0.73mm/px · 3 of 82 slices shown]
[im 28/82  soft-tissue]
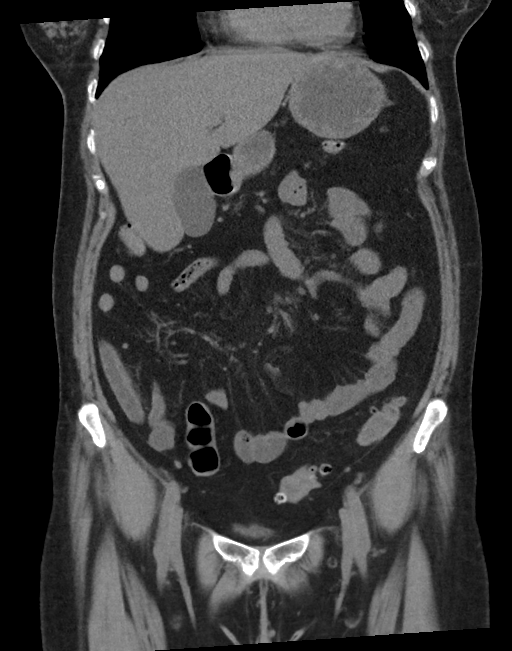
[im 37/82  soft-tissue]
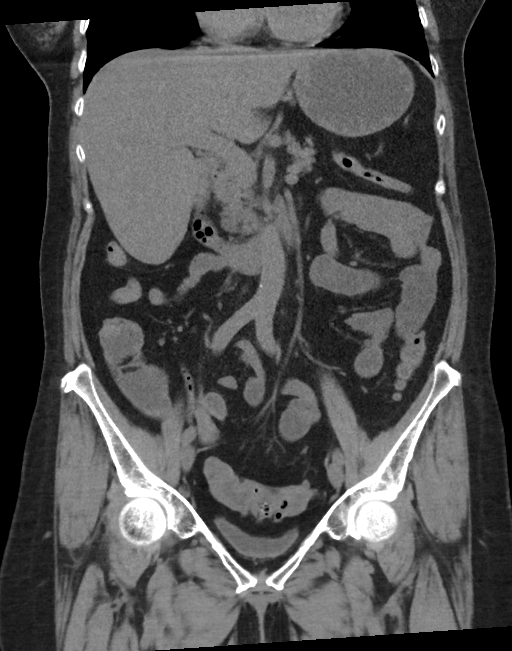
[im 46/82  soft-tissue]
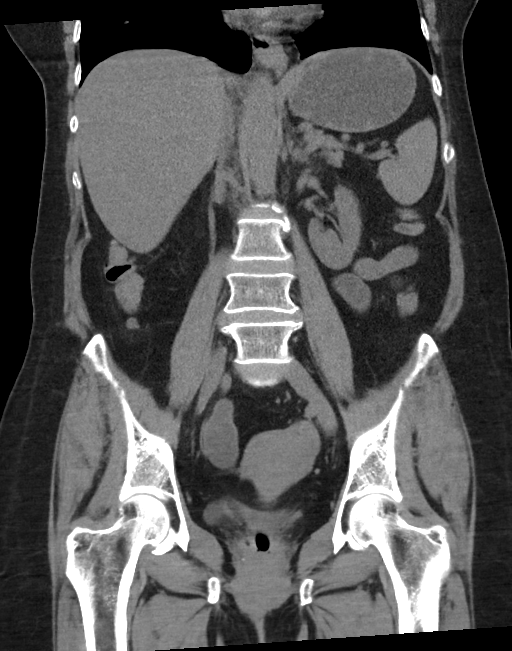

[14 of 46 positions shown; findings below may reference images not displayed]

FINDINGS: Lower chest: No pleural fluid or confluent airspace disease.

Hepatobiliary: Mild hepatic steatosis. No evidence of focal lesion.
Gallbladder physiologically distended, no calcified stone. No
biliary dilatation.

Pancreas: No ductal dilatation or inflammation.

Spleen: Normal in size without focal abnormality.

Adrenals/Urinary Tract: Normal adrenal glands. No hydronephrosis. No
perinephric edema. No renal or ureteral calculi. Urinary bladder is
partially distended. No bladder wall thickening.

Stomach/Bowel: Distended stomach with intraluminal fluid. No gastric
wall thickening. Scattered fluid-filled small bowel loops in the
left abdomen and lower pelvis without abnormal distension or
obstruction. No perienteric inflammation. Normal appendix. Scattered
liquid stool throughout the colon which is otherwise decompressed.
Multifocal colonic diverticulosis. No focal diverticulitis, colonic
wall thickening or pericolonic edema.

Vascular/Lymphatic: No significant vascular findings are present. No
enlarged abdominal or pelvic lymph nodes.

Reproductive: Normal caliber abdominal aorta. No enlarged lymph
nodes in the abdomen or pelvis.

Other: No ascites or free air. No focal fluid collection. Tiny fat
containing umbilical hernia.

Musculoskeletal: There are no acute or suspicious osseous
abnormalities. Grade 1 anterolisthesis of L4 on L5 is likely facet
mediated.
IMPRESSION: 1. Scattered liquid stool throughout the colon, can be seen with
diarrheal illness. Multifocal colonic diverticula. No evidence of
diverticulitis, colonic wall thickening or pericolonic inflammation.
2. Fluid distended stomach without evidence of gastric wall
thickening or gastric outlet obstruction.
3. Mild hepatic steatosis.

Aortic Atherosclerosis ([id number]-P2K.K).

ADDENDUM:
This addendum is created to correct the impression. Aortic
atherosclerosis was included in the impression, however there is no
aortic atherosclerosis and this was added in error.

*** End of Addendum ***
FINDINGS: Lower chest: No pleural fluid or confluent airspace disease.

Hepatobiliary: Mild hepatic steatosis. No evidence of focal lesion.
Gallbladder physiologically distended, no calcified stone. No
biliary dilatation.

Pancreas: No ductal dilatation or inflammation.

Spleen: Normal in size without focal abnormality.

Adrenals/Urinary Tract: Normal adrenal glands. No hydronephrosis. No
perinephric edema. No renal or ureteral calculi. Urinary bladder is
partially distended. No bladder wall thickening.

Stomach/Bowel: Distended stomach with intraluminal fluid. No gastric
wall thickening. Scattered fluid-filled small bowel loops in the
left abdomen and lower pelvis without abnormal distension or
obstruction. No perienteric inflammation. Normal appendix. Scattered
liquid stool throughout the colon which is otherwise decompressed.
Multifocal colonic diverticulosis. No focal diverticulitis, colonic
wall thickening or pericolonic edema.

Vascular/Lymphatic: No significant vascular findings are present. No
enlarged abdominal or pelvic lymph nodes.

Reproductive: Normal caliber abdominal aorta. No enlarged lymph
nodes in the abdomen or pelvis.

Other: No ascites or free air. No focal fluid collection. Tiny fat
containing umbilical hernia.

Musculoskeletal: There are no acute or suspicious osseous
abnormalities. Grade 1 anterolisthesis of L4 on L5 is likely facet
mediated.
IMPRESSION: 1. Scattered liquid stool throughout the colon, can be seen with
diarrheal illness. Multifocal colonic diverticula. No evidence of
diverticulitis, colonic wall thickening or pericolonic inflammation.
2. Fluid distended stomach without evidence of gastric wall
thickening or gastric outlet obstruction.
3. Mild hepatic steatosis.

Aortic Atherosclerosis (3FY5N-P2K.K).

## 2022-03-27 ENCOUNTER — Other Ambulatory Visit: Payer: Self-pay

## 2022-03-27 ENCOUNTER — Encounter (HOSPITAL_BASED_OUTPATIENT_CLINIC_OR_DEPARTMENT_OTHER): Payer: Self-pay | Admitting: Emergency Medicine

## 2022-03-27 ENCOUNTER — Emergency Department (HOSPITAL_BASED_OUTPATIENT_CLINIC_OR_DEPARTMENT_OTHER): Payer: Medicare Other

## 2022-03-27 ENCOUNTER — Emergency Department (HOSPITAL_BASED_OUTPATIENT_CLINIC_OR_DEPARTMENT_OTHER)
Admission: EM | Admit: 2022-03-27 | Discharge: 2022-03-27 | Disposition: A | Discharge status: Home / Self Care | Payer: Medicare Other | Attending: Emergency Medicine | Admitting: Emergency Medicine

## 2022-03-27 DIAGNOSIS — R5383 Other fatigue: Secondary | ICD-10-CM | POA: Insufficient documentation

## 2022-03-27 DIAGNOSIS — R0602 Shortness of breath: Secondary | ICD-10-CM | POA: Diagnosis not present

## 2022-03-27 DIAGNOSIS — R42 Dizziness and giddiness: Secondary | ICD-10-CM | POA: Insufficient documentation

## 2022-03-27 DIAGNOSIS — F419 Anxiety disorder, unspecified: Secondary | ICD-10-CM | POA: Diagnosis not present

## 2022-03-27 LAB — COMPREHENSIVE METABOLIC PANEL
ALT: 23 U/L (ref 0–44)
AST: 33 U/L (ref 15–41)
Albumin: 3.7 g/dL (ref 3.5–5.0)
Alkaline Phosphatase: 97 U/L (ref 38–126)
Anion gap: 9 (ref 5–15)
BUN: 11 mg/dL (ref 8–23)
CO2: 27 mmol/L (ref 22–32)
Calcium: 8.6 mg/dL — ABNORMAL LOW (ref 8.9–10.3)
Chloride: 100 mmol/L (ref 98–111)
Creatinine, Ser: 0.69 mg/dL (ref 0.44–1.00)
GFR, Estimated: >60 mL/min (ref >60)
Glucose, Bld: 103 mg/dL — ABNORMAL HIGH (ref 70–99)
Potassium: 4.4 mmol/L (ref 3.5–5.1)
Sodium: 136 mmol/L (ref 135–145)
Total Bilirubin: 0.9 mg/dL (ref 0.3–1.2)
Total Protein: 7.2 g/dL (ref 6.5–8.1)

## 2022-03-27 LAB — CBC WITH DIFFERENTIAL/PLATELET
Abs Immature Granulocytes: 0.03 10*3/uL (ref 0.00–0.07)
Basophils Absolute: 0.1 10*3/uL (ref 0.0–0.1)
Basophils Relative: 1 %
Eosinophils Absolute: 0.2 10*3/uL (ref 0.0–0.5)
Eosinophils Relative: 3 %
HCT: 38.3 % (ref 36.0–46.0)
Hemoglobin: 12.9 g/dL (ref 12.0–15.0)
Immature Granulocytes: 1 %
Lymphocytes Relative: 30 %
Lymphs Abs: 1.9 10*3/uL (ref 0.7–4.0)
MCH: 32.7 pg (ref 26.0–34.0)
MCHC: 33.7 g/dL (ref 30.0–36.0)
MCV: 97 fL (ref 80.0–100.0)
Monocytes Absolute: 0.5 10*3/uL (ref 0.1–1.0)
Monocytes Relative: 8 %
Neutro Abs: 3.6 10*3/uL (ref 1.7–7.7)
Neutrophils Relative %: 57 %
Platelets: 288 10*3/uL (ref 150–400)
RBC: 3.95 MIL/uL (ref 3.87–5.11)
RDW: 12.1 % (ref 11.5–15.5)
WBC: 6.2 10*3/uL (ref 4.0–10.5)
nRBC: 0 % (ref 0.0–0.2)

## 2022-03-27 LAB — TROPONIN I (HIGH SENSITIVITY): Troponin I (High Sensitivity): 2 ng/L (ref <18)

## 2022-03-27 MED ORDER — HYDRALAZINE HCL 20 MG/ML IJ SOLN
5.0000 mg | Freq: Once | INTRAMUSCULAR | Status: AC
  Administered 2022-03-27: 5 mg via INTRAVENOUS
  Filled 2022-03-27: qty 1

## 2022-03-27 MED ORDER — SODIUM CHLORIDE 0.9 % IV BOLUS
1000.0000 mL | Freq: Once | INTRAVENOUS | Status: AC
  Administered 2022-03-27: 1000 mL via INTRAVENOUS

## 2022-03-27 MED ORDER — METOPROLOL SUCCINATE ER 25 MG PO TB24
25.0000 mg | ORAL_TABLET | Freq: Once | ORAL | Status: AC
  Administered 2022-03-27: 25 mg via ORAL
  Filled 2022-03-27: qty 1

## 2022-03-27 NOTE — ED Notes (Signed)
Patient states she takes alprazolam BID as needed, and has only taken it once today. Requesting she take hers that she has with her. Dr. Darl Householder was notified and states is fine for patient to take her home dosage. Patient took 1 alprazolam at this time. Repositioned for comfort. Denies any other needs at present.

## 2022-03-27 NOTE — ED Notes (Signed)
ED Provider at bedside. 

## 2022-03-27 NOTE — ED Provider Notes (Signed)
[facility] Provider Note   CSN: [id number] Arrival date & time: 03/27/22  1330     History  Chief Complaint  Patient presents with   Shortness of Breath    Katie [name] is a 66 y.o. female.  Patient is a 65 year old female with POTS medical history of pots disease and mast cell activation syndrome presenting for complaints of shortness of breath.  Patient admits to lightheadedness, fatigue, shortness of breath over the past 24 hours.  She also admits to argument with her daughter last night, standing and cooking all day, and increase anxiety and depression over the holidays. Patient admits to have difficult to control blood pressures with blood pressures above the 180s at home. Patient states "I just need fluids. I feel this way when I am dehydrated".   The history is provided by the patient. No language interpreter was used.  Shortness of Breath Associated symptoms: no abdominal pain, no chest pain, no cough, no ear pain, no fever, no rash, no sore throat and no vomiting        Home Medications Prior to Admission medications   Medication Sig Start Date End Date Taking? Authorizing Provider  ALPRAZolam Duanne Moron) 0.5 MG tablet Take 0.5 mg by mouth 3 (three) times daily as needed. 09/12/19   [provider]  azelastine (ASTELIN) 0.1 % nasal spray 1 spray by Nasal route 2 times daily. Use in each nostril as directed    [provider]  cetirizine (ZYRTEC) 10 MG tablet Take 10 mg by mouth daily.    [provider]  Cholecalciferol (VITAMIN D-3) 5000 UNITS TABS Take 5,000 tablets by mouth daily.    [provider]  diphenhydrAMINE (BENADRYL) 25 MG tablet Take 25 mg by mouth nightly as needed for Itching.    [provider]  levothyroxine (SYNTHROID) 25 MCG tablet Take 25 mcg by mouth daily. 09/27/19   [provider]  metoprolol succinate (TOPROL-XL) 25 MG 24 hr tablet Take 1 tablet (25 mg total) by  mouth daily. 1 TABLET BY MOUTH 4 TIMES A DAY AS NEEDED 10/12/19   Loletha Grayer, MD  montelukast (SINGULAIR) 10 MG tablet Take 10 mg by mouth at bedtime.    [provider]  montelukast (SINGULAIR) 10 MG tablet 1 tablet in the evening 07/23/16   [provider]  Multiple Vitamin (MULTI-VITAMIN) tablet Take by mouth.    [provider]  omeprazole (PRILOSEC OTC) 20 MG tablet Take 20 mg by mouth daily.     [provider]  ondansetron (ZOFRAN ODT) 4 MG disintegrating tablet Take 1 tablet (4 mg total) by mouth every 8 (eight) hours as needed for nausea or vomiting. 08/24/20   Henderly, Britni A, PA-C  Probiotic Product (PROBIOTIC ADVANCED PO) Take 1 capsule by mouth 2 (two) times daily. 100 BILLION    [provider]      Allergies    Epinephrine, Isovue [iopamidol], Sulfa antibiotics, Acidophilus (probiotic) [lactase-lactobacillus], Azithromycin, Ciprofloxacin, Flagyl [metronidazole], Prednisone, Sulfacetamide sodium, and Wheat bran    Review of Systems   Review of Systems  Constitutional:  Positive for fatigue. Negative for chills and fever.  HENT:  Negative for ear pain and sore throat.   Eyes:  Negative for pain and visual disturbance.  Respiratory:  Positive for shortness of breath. Negative for cough.   Cardiovascular:  Negative for chest pain and palpitations.  Gastrointestinal:  Negative for abdominal pain and vomiting.  Genitourinary:  Negative for dysuria and hematuria.  Musculoskeletal:  Negative for arthralgias and back pain.  Skin:  Negative for color change and rash.  Neurological:  Positive for dizziness. Negative for seizures and syncope.  All other systems reviewed and are negative.   Physical Exam Updated Vital Signs BP (!) 159/87 (BP Location: Right Arm)   Pulse 94   Temp 97.9 F (36.6 C) (Oral)   Resp 14   Ht '5\' 7"'$  (1.702 m)   Wt 72.6 kg   LMP 03/15/2013   SpO2 100%   BMI 25.06 kg/m  Physical Exam Vitals and nursing  note reviewed.  Constitutional:      General: She is not in acute distress.    Appearance: She is well-developed.  HENT:     Head: Normocephalic and atraumatic.  Eyes:     Conjunctiva/sclera: Conjunctivae normal.  Cardiovascular:     Rate and Rhythm: Normal rate and regular rhythm.     Heart sounds: No murmur heard. Pulmonary:     Effort: Pulmonary effort is normal. No respiratory distress.     Breath sounds: Normal breath sounds.  Abdominal:     Palpations: Abdomen is soft.     Tenderness: There is no abdominal tenderness.  Musculoskeletal:        General: No swelling.     Cervical back: Neck supple.  Skin:    General: Skin is warm and dry.     Capillary Refill: Capillary refill takes less than 2 seconds.  Neurological:     Mental Status: She is alert.  Psychiatric:        Mood and Affect: Mood normal.     ED Results / Procedures / Treatments   Labs (all labs ordered are listed, but only abnormal results are displayed) Labs Reviewed  COMPREHENSIVE METABOLIC PANEL - Abnormal; Notable for the following components:      Result Value   Glucose, Bld 103 (*)    Calcium 8.6 (*)    All other components within normal limits  CBC WITH DIFFERENTIAL/PLATELET  TROPONIN I (HIGH SENSITIVITY)    EKG EKG Interpretation  Date/Time:  Thursday March 27 2022 14:57:13 EST Ventricular Rate:  80 PR Interval:  176 QRS Duration: 96 QT Interval:  404 QTC Calculation: 466 R Axis:   -1 Text Interpretation: Sinus rhythm Abnormal R-wave progression, early transition No significant change since last tracing Confirmed by Wandra Arthurs [phone]) on 03/27/2022 3:10:02 PM  Radiology No results found.  Procedures Procedures    Medications Ordered in ED Medications  sodium chloride 0.9 % bolus 1,000 mL (0 mLs Intravenous Stopped 03/27/22 1549)  hydrALAZINE (APRESOLINE) injection 5 mg (5 mg Intravenous Given 03/27/22 1514)  metoprolol succinate (TOPROL-XL) 24 hr tablet 25 mg (25 mg Oral  Given 03/27/22 1515)    ED Course/ Medical Decision Making/ A&P                           Medical Decision Making Amount and/or Complexity of Data Reviewed Labs: ordered. Radiology: ordered.  Risk Prescription drug management.    66 year old female with POTS medical history of pots disease and mast cell activation syndrome presenting for complaints of shortness of breath. Pt is Aox3, no acute distress, afebrile, with stable vitals. Physical exam demonstrates no signs of respiratory distress. No hypoxia. Equal bilateral breath sounds with no adventitious lung sounds. Pt has stable ECG, troponin's, electrolytes, and CXR. PERC negative. Low suspicion PE. No clinical signs of dehydration. Pt admits to improvement of symptoms after  IVF bolus.   Pt has BP 195/91 throughout ED visit. Did not take her morning antihypertensives. Will prescribe.   Patient in no distress and overall condition improved here in the ED. Detailed discussions were had with the patient regarding current findings, and need for close f/u with PCP or on call doctor. The patient has been instructed to return immediately if the symptoms worsen in any way for re-evaluation. Patient verbalized understanding and is in agreement with current care plan. All questions answered prior to discharge.         Final Clinical Impression(s) / ED Diagnoses Final diagnoses:  Anxiety  Shortness of breath    Rx / DC Orders ED Discharge Orders     None         Lianne Cure, DO 57/90/38 1445

## 2022-03-27 NOTE — Discharge Instructions (Signed)
Continue taking your blood pressure medicine and Xanax.  If you need to take extra Xanax and run out early you need to call your doctor  Please follow-up with your doctor   As we discussed, your electrolytes are normal right now  Please stay hydrated  Return to ER if you have thoughts of harming yourself or others or severe anxiety or shortness of breath or chest pain

## 2022-03-27 NOTE — ED Notes (Signed)
Patient ambulated to restroom without difficulty.

## 2022-03-27 NOTE — ED Provider Notes (Signed)
  Physical Exam  BP (!) 173/86 (BP Location: Right Arm)   Pulse 87   Temp 97.9 F (36.6 C) (Oral)   Resp 17   Ht '5\' 7"'$  (1.702 m)   Wt 72.6 kg   LMP 03/15/2013   SpO2 100%   BMI 25.06 kg/m   Physical Exam  Procedures  Procedures  ED Course / MDM    Medical Decision Making Care assumed at 3 PM.  Patient has history of POTS and is under a lot of stress.  She gets regular IV fluids and just had one several days ago.  Patient states that her husband died a year and a half ago.  She also has some family over for Thanksgiving.  Signout pending IV fluids and reassessment.  3:54 PM Electrolytes unremarkable.  Patient given IV fluids and felt better.  Blood pressure down to 170 now.  She took her extra dose of her Xanax.  Told her that if she is stressed she can take Xanax.  If she has thoughts of harming herself or others then she should return back to the ER or go to behavioral health.   Problems Addressed: Anxiety: chronic illness or injury Shortness of breath: acute illness or injury  Amount and/or Complexity of Data Reviewed Labs: ordered. Decision-making details documented in ED Course. Radiology: ordered and independent interpretation performed. Decision-making details documented in ED Course. ECG/medicine tests: ordered and independent interpretation performed. Decision-making details documented in ED Course.  Risk Prescription drug management.          Drenda Freeze, MD 03/27/22 [phone]

## 2022-03-27 NOTE — ED Triage Notes (Signed)
Patient reports she has to get saline infusions once a week at [facility]. Reports she feels like she has a sinus infection and has been feeling tired. Also states when her fluids get low, she feels short of breath, so she is thinking she needs fluids.

## 2022-08-02 ENCOUNTER — Emergency Department (HOSPITAL_BASED_OUTPATIENT_CLINIC_OR_DEPARTMENT_OTHER)
Admission: EM | Admit: 2022-08-02 | Discharge: 2022-08-02 | Discharge status: Against Medical Advice | Payer: Medicare Other | Source: Home / Self Care

## 2022-11-03 ENCOUNTER — Other Ambulatory Visit (HOSPITAL_COMMUNITY)
Admission: RE | Admit: 2022-11-03 | Discharge: 2022-11-03 | Disposition: A | Discharge status: Home / Self Care | Payer: Medicare Other | Source: Ambulatory Visit | Attending: Obstetrics & Gynecology | Admitting: Obstetrics & Gynecology

## 2022-11-03 ENCOUNTER — Encounter: Payer: Self-pay | Admitting: Obstetrics & Gynecology

## 2022-11-03 ENCOUNTER — Telehealth: Payer: Self-pay

## 2022-11-03 ENCOUNTER — Ambulatory Visit (INDEPENDENT_AMBULATORY_CARE_PROVIDER_SITE_OTHER): Payer: Medicare Other | Admitting: Obstetrics & Gynecology

## 2022-11-03 VITALS — BP 110/70 | HR 93 | Ht 66.75 in | Wt 160.0 lb

## 2022-11-03 DIAGNOSIS — M8589 Other specified disorders of bone density and structure, multiple sites: Secondary | ICD-10-CM

## 2022-11-03 DIAGNOSIS — B3731 Acute candidiasis of vulva and vagina: Secondary | ICD-10-CM | POA: Diagnosis not present

## 2022-11-03 DIAGNOSIS — Z124 Encounter for screening for malignant neoplasm of cervix: Secondary | ICD-10-CM | POA: Diagnosis not present

## 2022-11-03 DIAGNOSIS — Z01419 Encounter for gynecological examination (general) (routine) without abnormal findings: Secondary | ICD-10-CM

## 2022-11-03 DIAGNOSIS — Z79890 Hormone replacement therapy (postmenopausal): Secondary | ICD-10-CM

## 2022-11-03 DIAGNOSIS — Z9189 Other specified personal risk factors, not elsewhere classified: Secondary | ICD-10-CM | POA: Diagnosis not present

## 2022-11-03 DIAGNOSIS — N898 Other specified noninflammatory disorders of vagina: Secondary | ICD-10-CM

## 2022-11-03 LAB — WET PREP FOR TRICH, YEAST, CLUE

## 2022-11-03 MED ORDER — TERCONAZOLE 0.8 % VA CREA: 1.0000 | TOPICAL_CREAM | Freq: Every day | VAGINAL | 2 refills | Status: AC

## 2022-11-03 NOTE — Progress Notes (Signed)
Katie [name] 06-24-1955 [id number]   History:    67 y.o. G2P2L2 Widowed. 1 son with Autism.  Working in [employer].   RP:  Established patient presenting for annual gyn exam   HPI: Postmenopause, started on Estradiol/Progesterone gel with [facility]. No PMB.  No pelvic pain.  H/O LEEP >15 yrs ago, paps normal since then.  Currently abstinent.  Last Pap Neg in 10/2020.  Pap reflex today.  Screening Mammo Neg 02/2022.  Urine/BMs wnl. BD 08/2020 Osteopenia T-Score -2.2.  Repeat BD here now. Health labs with Fam MD.  ColoGard every 3 yrs through [facility].   Past medical history,surgical history, family history and social history were all reviewed and documented in the EPIC chart.  Gynecologic History Patient's last menstrual period was 03/15/2013.  Obstetric History OB History  Gravida Para Term Preterm AB Living  2 2 2     2   SAB IAB Ectopic Multiple Live Births               # Outcome Date GA Lbr Len/2nd Weight Sex Delivery Anes PTL Lv  2 Term           1 Term              ROS: A ROS was performed and pertinent positives and negatives are included in the history. GENERAL: No fevers or chills. HEENT: No change in vision, no earache, sore throat or sinus congestion. NECK: No pain or stiffness. CARDIOVASCULAR: No chest pain or pressure. No palpitations. PULMONARY: No shortness of breath, cough or wheeze. GASTROINTESTINAL: No abdominal pain, nausea, vomiting or diarrhea, melena or bright red blood per rectum. GENITOURINARY: No urinary frequency, urgency, hesitancy or dysuria. MUSCULOSKELETAL: No joint or muscle pain, no back pain, no recent trauma. DERMATOLOGIC: No rash, no itching, no lesions. ENDOCRINE: No polyuria, polydipsia, no heat or cold intolerance. No recent change in weight. HEMATOLOGICAL: No anemia or easy bruising or bleeding. NEUROLOGIC: No headache, seizures, numbness, tingling or weakness. PSYCHIATRIC: No depression, no loss of interest in normal activity or change in sleep  pattern.     Exam:   BP 110/70   Pulse 93   Ht 5' 6.75" (1.695 m)   Wt 160 lb (72.6 kg)   LMP 03/15/2013 Comment: not sexually active  SpO2 98%   BMI 25.25 kg/m   Body mass index is 25.25 kg/m.  General appearance : Well developed well nourished female. No acute distress HEENT: Eyes: no retinal hemorrhage or exudates,  Neck supple, trachea midline, no carotid bruits, no thyroidmegaly Lungs: Clear to auscultation, no rhonchi or wheezes, or rib retractions  Heart: Regular rate and rhythm, no murmurs or gallops Breast:Examined in sitting and supine position were symmetrical in appearance, no palpable masses or tenderness,  no skin retraction, no nipple inversion, no nipple discharge, no skin discoloration, no axillary or supraclavicular lymphadenopathy Abdomen: no palpable masses or tenderness, no rebound or guarding Extremities: no edema or skin discoloration or tenderness  Pelvic: Vulva: Normal             Vagina: No gross lesions or discharge  Cervix: No gross lesions or discharge.  Pap reflex done.  Uterus  AV, normal size, shape and consistency, non-tender and mobile  Adnexa  Without masses or tenderness  Anus: Normal  Wet Prep: Yeasts with budding and hyphae   Assessment/Plan:  67 y.o. female for annual exam   1. Encounter for routine gynecological examination with Papanicolaou smear of cervix Postmenopause, started on  Estradiol/Progesterone gel with [facility].  No PMB.  No pelvic pain.  H/O LEEP >15 yrs ago, paps normal since then.  Currently abstinent.  Last Pap Neg in 10/2020.  Pap reflex today.  Screening Mammo Neg 02/2022.  Urine/BMs wnl. BD 08/2020 Osteopenia T-Score -2.2.  Repeat BD here now. Health labs with Fam MD.  ColoGard every 3 yrs through [facility] MD. - Cytology - PAP( [place])  2. Postmenopausal hormone replacement therapy [facility] prescribing Estradiol and Progesterone gel.  Counseling done on the principle of lowest amount of Estradiol to avoid  excessive risks of Stroke and Breast Ca.  Recommended the equivalent of an Estradiol patch 0.1 or less and Progesterone 100 mg caps HS.  Patient voiced understanding and agreement.  3. Osteopenia of multiple sites BD 08/2020 Osteopenia T-Score -2.2.  Repeat BD here now.  - DG Bone Density; Future  4. Vagina itching Confirmed yeast vaginitis on Wet prep. Will treat with Terazol 3.  Usage reviewed, prescription sent to pharmacy. - WET PREP FOR TRICH, YEAST, CLUE  5. Other specified personal risk factors, not elsewhere classified  Other orders - metoprolol succinate (TOPROL-XL) 25 MG 24 hr tablet; Take 25 mg by mouth. - omeprazole (PRILOSEC) 10 MG capsule; Take 10 mg by mouth daily. - amLODipine (NORVASC) 5 MG tablet; Take 5 mg by mouth as needed. - UNABLE TO FIND; Med Name: bio-identical hormones - terconazole (TERAZOL 3) 0.8 % vaginal cream; Place 1 applicator vaginally at bedtime for 3 days.  5. Other specified personal risk factors, not elsewhere classified  Other orders - metoprolol succinate (TOPROL-XL) 25 MG 24 hr tablet; Take 25 mg by mouth. - omeprazole (PRILOSEC) 10 MG capsule; Take 10 mg by mouth daily. - amLODipine (NORVASC) 5 MG tablet; Take 5 mg by mouth as needed. - UNABLE TO FIND; Med Name: bio-identical hormones - terconazole (TERAZOL 3) 0.8 % vaginal cream; Place 1 applicator vaginally at bedtime for 3 days.   Genia Del MD, 2:38 PM

## 2022-11-03 NOTE — Telephone Encounter (Signed)
Pt LVM in triage line asking if she can still swim while taking treatment for +yeast today? Reports recent membership to a pool and has been attending frequently.  Rx'd terazol 3.  Please advise.

## 2022-11-04 LAB — CYTOLOGY - PAP: Diagnosis: NEGATIVE

## 2022-11-04 NOTE — Telephone Encounter (Signed)
Per ML: "Recommend no swimming during treatment because the cream will likely come out.   Dr Seymour Bars"  Pt notified and voiced understanding. Will close.

## 2022-11-11 ENCOUNTER — Ambulatory Visit (INDEPENDENT_AMBULATORY_CARE_PROVIDER_SITE_OTHER): Payer: Medicare Other

## 2022-11-11 ENCOUNTER — Other Ambulatory Visit: Payer: Self-pay | Admitting: Obstetrics & Gynecology

## 2022-11-11 DIAGNOSIS — M8589 Other specified disorders of bone density and structure, multiple sites: Secondary | ICD-10-CM

## 2022-11-11 DIAGNOSIS — Z01419 Encounter for gynecological examination (general) (routine) without abnormal findings: Secondary | ICD-10-CM

## 2022-11-11 DIAGNOSIS — N898 Other specified noninflammatory disorders of vagina: Secondary | ICD-10-CM

## 2022-11-11 DIAGNOSIS — Z13820 Encounter for screening for osteoporosis: Secondary | ICD-10-CM | POA: Diagnosis not present

## 2022-11-11 DIAGNOSIS — Z79890 Hormone replacement therapy (postmenopausal): Secondary | ICD-10-CM

## 2022-11-11 DIAGNOSIS — Z9189 Other specified personal risk factors, not elsewhere classified: Secondary | ICD-10-CM

## 2022-11-11 DIAGNOSIS — Z780 Asymptomatic menopausal state: Secondary | ICD-10-CM | POA: Diagnosis not present

## 2022-12-02 ENCOUNTER — Other Ambulatory Visit: Payer: Self-pay | Admitting: Endocrinology

## 2022-12-02 DIAGNOSIS — E049 Nontoxic goiter, unspecified: Secondary | ICD-10-CM

## 2022-12-22 ENCOUNTER — Ambulatory Visit
Admission: RE | Admit: 2022-12-22 | Discharge: 2022-12-22 | Disposition: A | Discharge status: Home / Self Care | Payer: Medicare Other | Source: Ambulatory Visit | Attending: Endocrinology | Admitting: Endocrinology

## 2022-12-22 DIAGNOSIS — E049 Nontoxic goiter, unspecified: Secondary | ICD-10-CM

## 2023-03-10 ENCOUNTER — Emergency Department (HOSPITAL_BASED_OUTPATIENT_CLINIC_OR_DEPARTMENT_OTHER): Payer: Medicare Other

## 2023-03-10 ENCOUNTER — Other Ambulatory Visit: Payer: Self-pay

## 2023-03-10 ENCOUNTER — Emergency Department (HOSPITAL_BASED_OUTPATIENT_CLINIC_OR_DEPARTMENT_OTHER)
Admission: EM | Admit: 2023-03-10 | Discharge: 2023-03-10 | Discharge status: Against Medical Advice | Payer: Medicare Other | Attending: Nurse Practitioner | Admitting: Nurse Practitioner

## 2023-03-10 ENCOUNTER — Encounter (HOSPITAL_BASED_OUTPATIENT_CLINIC_OR_DEPARTMENT_OTHER): Payer: Self-pay

## 2023-03-10 DIAGNOSIS — R0602 Shortness of breath: Secondary | ICD-10-CM | POA: Insufficient documentation

## 2023-03-10 DIAGNOSIS — Z5321 Procedure and treatment not carried out due to patient leaving prior to being seen by health care provider: Secondary | ICD-10-CM | POA: Insufficient documentation

## 2023-03-10 LAB — CBC WITH DIFFERENTIAL/PLATELET
Abs Immature Granulocytes: 0.01 10*3/uL (ref 0.00–0.07)
Basophils Absolute: 0 10*3/uL (ref 0.0–0.1)
Basophils Relative: 1 %
Eosinophils Absolute: 0.2 10*3/uL (ref 0.0–0.5)
Eosinophils Relative: 4 %
HCT: 39.8 % (ref 36.0–46.0)
Hemoglobin: 13.2 g/dL (ref 12.0–15.0)
Immature Granulocytes: 0 %
Lymphocytes Relative: 40 %
Lymphs Abs: 2 10*3/uL (ref 0.7–4.0)
MCH: 32.5 pg (ref 26.0–34.0)
MCHC: 33.2 g/dL (ref 30.0–36.0)
MCV: 98 fL (ref 80.0–100.0)
Monocytes Absolute: 0.5 10*3/uL (ref 0.1–1.0)
Monocytes Relative: 10 %
Neutro Abs: 2.3 10*3/uL (ref 1.7–7.7)
Neutrophils Relative %: 45 %
Platelets: 280 10*3/uL (ref 150–400)
RBC: 4.06 MIL/uL (ref 3.87–5.11)
RDW: 12.2 % (ref 11.5–15.5)
WBC: 5 10*3/uL (ref 4.0–10.5)
nRBC: 0 % (ref 0.0–0.2)

## 2023-03-10 LAB — TROPONIN I (HIGH SENSITIVITY): Troponin I (High Sensitivity): 2 ng/L (ref <18)

## 2023-03-10 NOTE — ED Provider Triage Note (Signed)
Emergency Medicine Provider Triage Evaluation Note  Katie [name] , a 67 y.o. female  was evaluated in triage.  Pt complains of shortness of breath. Recent sinus infection, currently on augmentin. History of POTS, dysautonomia  Review of Systems  Positive: Near syncope, shortness of breath, cough Negative: Abdominal pain, fever, chills  Physical Exam  BP (!) 160/97 (BP Location: Right Arm)   Pulse 97   Temp 98.1 F (36.7 C) (Oral)   Resp 18   Ht 5\' 7"  (1.702 m)   Wt 74.8 kg   LMP 03/15/2013 Comment: not sexually active  SpO2 100%   BMI 25.84 kg/m  Gen:   Awake, no distress   Resp:  Normal effort MSK:   Moves extremities without difficulty  Other:    Medical Decision Making  Medically screening exam initiated at 4:47 PM.  Appropriate orders placed.  Katie [name] was informed that the remainder of the evaluation will be completed by another provider, this initial triage assessment does not replace that evaluation, and the importance of remaining in the ED until their evaluation is complete.  Patient had ECG in office this morning. She is refusing a repeat in the ED. Review of chart reveals the following result:  Ventricular Rate 70 BPM Atrial Rate 70 BPM P-R Interval 172 ms QRS Duration 88 ms Q-T Interval 408 ms QTC Calculation Bazett 440 ms Calculated P Axis 44 degrees Calculated R Axis -13 degrees Calculated T Axis 38 degrees  Sinus rhythm Normal ECG When compared with ECG of 04-Feb-2016 16 50, No significant change was found    Felicie Morn, NP 03/10/23 1658

## 2023-03-10 NOTE — ED Triage Notes (Signed)
Pt reports shortness of breath and states she felt like she was going to pass out when she was checking in today. She reports it feels like she can't catch her breath. Denies chest pain. Reports she has POTS and "I get short of breath when I am low on fluids."   Has been taking Augmentin for 7 days for a sinus infection, did not take yesterday because she was feeling better.

## 2023-03-10 NOTE — ED Notes (Signed)
The patient requested staff to remove her IV because she no longer wanted to wait. IV was removed by nurse tech. Pt was verbalizing frustration about the wait time and appeared visibly upset with staff. Pt brought back into triage room. She stated "I have never had to wait this long before" and I will be leaving a review and it will not be a good one!" She stated she was upset that she did not have her results of her electrolyte panel and I spoke up to explain to her the reason for the delayed results and was gathering my supplies for the redraw but she told me not to interrupt her when she is talking. This RN apologized to her and then I stopped talking. The charge RN informed her that her electrolyte panel had hemolyzed and he explained to her what that meant. Then he explained to her that it is very unusually busy day and I, again, apologized to her for my delay in being able to have a chance to redraw her labs but she kept interrupting both of [place] and would not allow [place] to finish explaining to her the reason for the delay. Charge RN explained to her that she is welcome to leave or she could wait and have her labs redrawn. She asked how long it would be to have them resulted and the charge RN estimated between 10-45 mins and she stated she would not be waiting. She grabbed all of her belongings and exited the building. This RN again apologized to her for the delay. Pt seen ambulating out of ED with independent steady gait, skin warm and dry.

## 2023-03-10 NOTE — ED Notes (Addendum)
Called to speak to pt in waiting room due to pt being upset due to long wait. Pt stated has Hx of Potts and Dysautonomia and has never waited this long to be seen by a provider, states she has been waiting over 4 hours. Pt states she needs fluids before she has an episode and states a "note" has been put our system from her provider that dictates she be seen urgently and provided fluids. PT also upset that her electrolytes have not been tested.   Explained to pt the ED is and acuity and arrival time system, most patiens are roomed based on time of arrival unless acuity deems them a priority. Also that pt was seen by a provider and received a medical screening exam in triage. Informed pt that as of our conversation she has been in the department for 2 and a half hours and that I am not aware of any way a PCP can put in a note and even if there were one our policy is still a time/acuity standard. Informed pt that labs were drawn and no results indicated an increase in her acuity but that the electrolyte test did not result and will have to be redrawn, informed pt we could redraw right away and have results in 15-40 mins pt declined. Pt stated she has dogs at home and could not wait any longer.

## 2023-03-10 NOTE — ED Notes (Signed)
Pt is refusing an EKG at this time. She states she went to [facility] today for the same symptoms and was told it was fine and there were no changes. This RN explained to he the risk of not having an EKG at this time and she states she understands and is still politely refusing at this time.

## 2023-03-29 ENCOUNTER — Emergency Department (HOSPITAL_BASED_OUTPATIENT_CLINIC_OR_DEPARTMENT_OTHER)
Admission: EM | Admit: 2023-03-29 | Discharge: 2023-03-29 | Discharge status: Against Medical Advice | Payer: Medicare Other | Attending: Emergency Medicine | Admitting: Emergency Medicine

## 2023-03-29 ENCOUNTER — Encounter (HOSPITAL_BASED_OUTPATIENT_CLINIC_OR_DEPARTMENT_OTHER): Payer: Self-pay | Admitting: Emergency Medicine

## 2023-03-29 DIAGNOSIS — Z5321 Procedure and treatment not carried out due to patient leaving prior to being seen by health care provider: Secondary | ICD-10-CM | POA: Diagnosis not present

## 2023-03-29 DIAGNOSIS — E860 Dehydration: Secondary | ICD-10-CM | POA: Insufficient documentation

## 2023-03-29 LAB — BASIC METABOLIC PANEL
Anion gap: 11 (ref 5–15)
BUN: 11 mg/dL (ref 8–23)
CO2: 23 mmol/L (ref 22–32)
Calcium: 8.8 mg/dL — ABNORMAL LOW (ref 8.9–10.3)
Chloride: 104 mmol/L (ref 98–111)
Creatinine, Ser: 0.62 mg/dL (ref 0.44–1.00)
GFR, Estimated: >60 mL/min (ref >60)
Glucose, Bld: 89 mg/dL (ref 70–99)
Potassium: 3.4 mmol/L — ABNORMAL LOW (ref 3.5–5.1)
Sodium: 138 mmol/L (ref 135–145)

## 2023-03-29 NOTE — ED Triage Notes (Signed)
Pt reports she requires regular IVF infusions and thinks she needs one tonight because she will have a hectic schedule the next few days

## 2023-04-27 ENCOUNTER — Emergency Department (HOSPITAL_BASED_OUTPATIENT_CLINIC_OR_DEPARTMENT_OTHER): Payer: Medicare Other

## 2023-04-27 ENCOUNTER — Encounter (HOSPITAL_BASED_OUTPATIENT_CLINIC_OR_DEPARTMENT_OTHER): Payer: Self-pay

## 2023-04-27 ENCOUNTER — Other Ambulatory Visit: Payer: Self-pay

## 2023-04-27 ENCOUNTER — Emergency Department (HOSPITAL_BASED_OUTPATIENT_CLINIC_OR_DEPARTMENT_OTHER)
Admission: EM | Admit: 2023-04-27 | Discharge: 2023-04-27 | Disposition: A | Discharge status: Home / Self Care | Payer: Medicare Other | Attending: Emergency Medicine | Admitting: Emergency Medicine

## 2023-04-27 DIAGNOSIS — W19XXXA Unspecified fall, initial encounter: Secondary | ICD-10-CM | POA: Insufficient documentation

## 2023-04-27 DIAGNOSIS — Z79899 Other long term (current) drug therapy: Secondary | ICD-10-CM | POA: Diagnosis not present

## 2023-04-27 DIAGNOSIS — S62632B Displaced fracture of distal phalanx of right middle finger, initial encounter for open fracture: Secondary | ICD-10-CM | POA: Diagnosis not present

## 2023-04-27 DIAGNOSIS — S6991XA Unspecified injury of right wrist, hand and finger(s), initial encounter: Secondary | ICD-10-CM | POA: Diagnosis present

## 2023-04-27 DIAGNOSIS — S62639B Displaced fracture of distal phalanx of unspecified finger, initial encounter for open fracture: Secondary | ICD-10-CM

## 2023-04-27 MED ORDER — BACITRACIN ZINC 500 UNIT/GM EX OINT: TOPICAL_OINTMENT | Freq: Two times a day (BID) | CUTANEOUS | Status: DC

## 2023-04-27 MED ORDER — AMOXICILLIN-POT CLAVULANATE 875-125 MG PO TABS: 1.0000 | ORAL_TABLET | Freq: Two times a day (BID) | ORAL | 0 refills | Status: AC

## 2023-04-27 MED ORDER — AMOXICILLIN-POT CLAVULANATE 875-125 MG PO TABS
1.0000 | ORAL_TABLET | Freq: Once | ORAL | Status: AC
  Administered 2023-04-27: 1 via ORAL
  Filled 2023-04-27: qty 1

## 2023-04-27 MED ORDER — ACETAMINOPHEN 325 MG PO TABS
650.0000 mg | ORAL_TABLET | Freq: Once | ORAL | Status: AC
  Administered 2023-04-27: 650 mg via ORAL
  Filled 2023-04-27: qty 2

## 2023-04-27 NOTE — ED Provider Notes (Addendum)
[facility] Provider Note   CSN: [id number] Arrival date & time: 04/27/23  1536     History  Chief Complaint  Patient presents with   Finger Injury    Katie [name] is a 67 y.o. female with a history of Lyme disease, fibromyalgia, and POTS who presents the ED today for a finger injury.  Patient reports that yesterday she was opening a window when it fell and landed on her right middle finger, breaking off the tip of her fingernail.  She states that she cleaned the wound, dressed it, and has been alternating between Ibuprofen and Tylenol for pain as needed.  Patient went to urgent care this morning for further evaluation as her finger was still bleeding.  They advised her to come here for further evaluation and for possible cauterization. There is no active bleeding at the time of evaluation. She denies blood thinner use. No loss of sensation, impaired ROM, numbness/tingling, or fevers.  Denies any additional complaints or concerns at this time.    Home Medications Prior to Admission medications   Medication Sig Start Date End Date Taking? Authorizing Provider  amoxicillin-clavulanate (AUGMENTIN) 875-125 MG tablet Take 1 tablet by mouth every 12 (twelve) hours for 7 days. 04/27/23 05/04/23 Yes Maxwell Marion, PA-C  ALPRAZolam Prudy Feeler) 0.5 MG tablet Take 0.5 mg by mouth 3 (three) times daily as needed. 09/12/19   [provider]  amLODipine (NORVASC) 5 MG tablet Take 5 mg by mouth as needed.    [provider]  cetirizine (ZYRTEC) 10 MG tablet Take 10 mg by mouth daily.    [provider]  diphenhydrAMINE (BENADRYL) 25 MG tablet Take 25 mg by mouth nightly as needed for Itching.    [provider]  levothyroxine (SYNTHROID) 25 MCG tablet Take 25 mcg by mouth daily. 09/27/19   [provider]  metoprolol succinate (TOPROL-XL) 25 MG 24 hr tablet Take 25 mg by mouth. 01/17/19   [provider]   montelukast (SINGULAIR) 10 MG tablet Take 10 mg by mouth at bedtime.    [provider]  Multiple Vitamin (MULTI-VITAMIN) tablet Take by mouth.    [provider]  omeprazole (PRILOSEC) 10 MG capsule Take 10 mg by mouth daily.    [provider]  Probiotic Product (PROBIOTIC ADVANCED PO) Take 1 capsule by mouth 2 (two) times daily. 100 BILLION    [provider]  UNABLE TO FIND Med Name: bio-identical hormones    [provider]      Allergies    Epinephrine, Isovue [iopamidol], Sulfa antibiotics, Acidophilus (probiotic) [bacid], Azithromycin, Ciprofloxacin, Flagyl [metronidazole], Prednisone, Sulfacetamide sodium, and Wheat    Review of Systems   Review of Systems  Musculoskeletal:        Right middle finger injury  All other systems reviewed and are negative.   Physical Exam Updated Vital Signs BP (!) 136/97 (BP Location: Left Arm)   Pulse 93   Temp 97.8 F (36.6 C)   Resp 18   Ht 5\' 7"  (1.702 m)   Wt 73.9 kg   LMP 03/15/2013 Comment: not sexually active  SpO2 100%   BMI 25.53 kg/m  Physical Exam Vitals and nursing note reviewed.  Constitutional:      Appearance: Normal appearance.  HENT:     Head: Normocephalic and atraumatic.     Mouth/Throat:     Mouth: Mucous membranes are moist.  Eyes:     Conjunctiva/sclera: Conjunctivae normal.  Pupils: Pupils are equal, round, and reactive to light.  Cardiovascular:     Rate and Rhythm: Normal rate and regular rhythm.     Pulses: Normal pulses.  Pulmonary:     Effort: Pulmonary effort is normal.     Breath sounds: Normal breath sounds.  Abdominal:     Palpations: Abdomen is soft.     Tenderness: There is no abdominal tenderness.  Musculoskeletal:        General: Tenderness present.     Comments: Tenderness to palpation of the distal tip of the right middle finger.  Strength, ROM, and sensation of right middle finger intact.  Finger nail is broken in half, proximal aspect  is intact. Slight bruising noted at the pad of the middle finger.  Capillary refills within 2 seconds.  Skin:    General: Skin is warm and dry.     Capillary Refill: Capillary refill takes less than 2 seconds.     Findings: No rash.  Neurological:     General: No focal deficit present.     Mental Status: She is alert.     Sensory: No sensory deficit.     Motor: No weakness.  Psychiatric:        Mood and Affect: Mood normal.        Behavior: Behavior normal.     ED Results / Procedures / Treatments   Labs (all labs ordered are listed, but only abnormal results are displayed) Labs Reviewed - No data to display  EKG None  Radiology DG Finger Middle Right Result Date: 04/27/2023 CLINICAL DATA:  Trauma. EXAM: RIGHT MIDDLE FINGER 3V COMPARISON:  None Available. FINDINGS: Comminuted fracture second distal phalanx with soft tissue swelling. Interphalangeal degenerative joint disease. IMPRESSION: Comminuted fracture second distal phalanx. Electronically Signed   By: Layla Maw M.D.   On: 04/27/2023 17:26    Procedures Procedures: not indicated.   Medications Ordered in ED Medications  bacitracin ointment (has no administration in time range)  acetaminophen (TYLENOL) tablet 650 mg (650 mg Oral Given 04/27/23 1654)  amoxicillin-clavulanate (AUGMENTIN) 875-125 MG per tablet 1 tablet (1 tablet Oral Given 04/27/23 1741)    ED Course/ Medical Decision Making/ A&P                                 Medical Decision Making Amount and/or Complexity of Data Reviewed Radiology: ordered.  Risk OTC drugs. Prescription drug management.   This patient presents to the ED for concern of right finger injury, this involves an extensive number of treatment options, and is a complaint that carries with it a high risk of complications and morbidity.   Differential diagnosis includes: fracture, dislocation, laceration, abrasion, contusion, hematoma, etc.   Comorbidities  See HPI  above   Additional History  Additional history obtained from prior records.   Imaging Studies  I ordered imaging studies including right middle finger x-ray  I independently visualized and interpreted imaging which showed: comminuted fractured second distal phalanx. I agree with the radiologist interpretation   Problem List / ED Course / Critical Interventions / Medication Management  Right middle finger injury Wound was irrigated and dressed I ordered medications including: Tylenol for pain Augmentin for infection prophylaxis  Reevaluation of the patient after these medicines showed that the patient improved. States that she is allergic to most antibiotics and has had adverse reactions to Keflex in the past. She states the only antibiotic that she  can take is Augmentin. Since it has staph coverage, medication was ordered for infection prophylaxis for her open wound. I have reviewed the patients home medicines and have made adjustments as needed Advised close follow up with hand surgery for reevaluation. Information for Dr. Melvyn Novas given. Tdap is not up-to-date.  Patient is wants updated at this time, she says she has a lot of allergies and gets sick frequently.  I explained the importance of updating his vaccination but she still declined.  She says that she does not want to be sick over the holidays and is declining the Tdap vaccine at this time.   Social Determinants of Health  Housing   Test / Admission - Considered  Discussed signs with patient.  All questions were answered. She is hemodynamically stable and safe for discharge home. Return precautions given.       Final Clinical Impression(s) / ED Diagnoses Final diagnoses:  Open fracture of tuft of distal phalanx of finger    Rx / DC Orders ED Discharge Orders          Ordered    amoxicillin-clavulanate (AUGMENTIN) 875-125 MG tablet  Every 12 hours        04/27/23 1720              Maxwell Marion,  PA-C 04/27/23 1750    Maxwell Marion, PA-C 04/27/23 1847    Rolan Bucco, MD 04/27/23 [phone]

## 2023-04-27 NOTE — Discharge Instructions (Addendum)
As discussed, you fractured the tip of your right middle finger.  Take Augmentin twice a day for the next 7 days to prevent infection.  I have provided information for Dr. Melvyn Novas with hand surgery, follow-up with his office in the next Avril days for reevaluation.  Leave the dressing on your laceration for the next 24 hours. After that, you can leave the wound open to air. After the first 24 hours, you can begin to clean the wound site with soap and water. Do not go into pools, lakes, or oceans until the wound is healed to prevent infection.   Wear the splint until you are able to follow up with hand surgery.  Alternate between Tylenol and ibuprofen every 4 hours as needed for pain.  Get help right away if: Your finger gets numb or turns blue.

## 2023-04-27 NOTE — ED Triage Notes (Signed)
Pt states that she was opening a window yesterday and the top window fell down on her right middle finger. Was seen at [facility] this morning due to the finger still bleeding.
